# Patient Record
Sex: Female | Born: 1980 | Race: White | Hispanic: No | Marital: Single | Smoking: Former smoker
Health system: Southern US, Community
[De-identification: ages and names within clinical notes are randomized; demographics above are authoritative.]

## PROBLEM LIST (undated history)

## (undated) DIAGNOSIS — F1910 Other psychoactive substance abuse, uncomplicated: Secondary | ICD-10-CM

## (undated) DIAGNOSIS — U071 COVID-19: Secondary | ICD-10-CM

## (undated) DIAGNOSIS — F419 Anxiety disorder, unspecified: Secondary | ICD-10-CM

## (undated) DIAGNOSIS — M779 Enthesopathy, unspecified: Secondary | ICD-10-CM

## (undated) DIAGNOSIS — N390 Urinary tract infection, site not specified: Secondary | ICD-10-CM

## (undated) DIAGNOSIS — F329 Major depressive disorder, single episode, unspecified: Secondary | ICD-10-CM

## (undated) DIAGNOSIS — F319 Bipolar disorder, unspecified: Secondary | ICD-10-CM

## (undated) DIAGNOSIS — F32A Depression, unspecified: Secondary | ICD-10-CM

## (undated) DIAGNOSIS — E785 Hyperlipidemia, unspecified: Secondary | ICD-10-CM

## (undated) HISTORY — DX: Other psychoactive substance abuse, uncomplicated: F19.10

## (undated) HISTORY — DX: COVID-19: U07.1

## (undated) HISTORY — PX: TUBAL LIGATION: SHX77

## (undated) HISTORY — DX: Enthesopathy, unspecified: M77.9

## (undated) HISTORY — DX: Hyperlipidemia, unspecified: E78.5

## (undated) HISTORY — PX: LEEP: SHX91

## (undated) HISTORY — DX: Urinary tract infection, site not specified: N39.0

---
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
## 1999-04-16 ENCOUNTER — Inpatient Hospital Stay (HOSPITAL_COMMUNITY): Admission: AD | Admit: 1999-04-16 | Discharge: 1999-04-16 | Payer: Self-pay | Admitting: Obstetrics

## 1999-05-11 ENCOUNTER — Emergency Department (HOSPITAL_COMMUNITY): Admission: EM | Admit: 1999-05-11 | Discharge: 1999-05-11 | Payer: Self-pay | Admitting: Emergency Medicine

## 1999-05-11 ENCOUNTER — Encounter: Payer: Self-pay | Admitting: Emergency Medicine

## 1999-05-19 ENCOUNTER — Other Ambulatory Visit: Admission: RE | Admit: 1999-05-19 | Discharge: 1999-05-19 | Payer: Self-pay | Admitting: Gynecology

## 1999-07-19 ENCOUNTER — Other Ambulatory Visit: Admission: RE | Admit: 1999-07-19 | Discharge: 1999-07-19 | Payer: Self-pay | Admitting: Gynecology

## 1999-07-20 ENCOUNTER — Encounter (INDEPENDENT_AMBULATORY_CARE_PROVIDER_SITE_OTHER): Payer: Self-pay

## 1999-07-20 ENCOUNTER — Other Ambulatory Visit: Admission: RE | Admit: 1999-07-20 | Discharge: 1999-07-20 | Payer: Self-pay | Admitting: Gynecology

## 1999-10-20 ENCOUNTER — Emergency Department (HOSPITAL_COMMUNITY): Admission: EM | Admit: 1999-10-20 | Discharge: 1999-10-20 | Payer: Self-pay | Admitting: Emergency Medicine

## 1999-12-10 ENCOUNTER — Inpatient Hospital Stay (HOSPITAL_COMMUNITY): Admission: AD | Admit: 1999-12-10 | Discharge: 1999-12-10 | Payer: Self-pay | Admitting: *Deleted

## 1999-12-13 ENCOUNTER — Inpatient Hospital Stay (HOSPITAL_COMMUNITY): Admission: AD | Admit: 1999-12-13 | Discharge: 1999-12-15 | Payer: Self-pay | Admitting: Gynecology

## 2000-01-24 ENCOUNTER — Other Ambulatory Visit: Admission: RE | Admit: 2000-01-24 | Discharge: 2000-01-24 | Payer: Self-pay | Admitting: Gynecology

## 2000-01-30 ENCOUNTER — Encounter (INDEPENDENT_AMBULATORY_CARE_PROVIDER_SITE_OTHER): Payer: Self-pay | Admitting: Specialist

## 2000-01-30 ENCOUNTER — Other Ambulatory Visit: Admission: RE | Admit: 2000-01-30 | Discharge: 2000-01-30 | Payer: Self-pay | Admitting: Gynecology

## 2000-03-05 ENCOUNTER — Other Ambulatory Visit: Admission: RE | Admit: 2000-03-05 | Discharge: 2000-03-05 | Payer: Self-pay | Admitting: Gynecology

## 2000-03-05 ENCOUNTER — Encounter (INDEPENDENT_AMBULATORY_CARE_PROVIDER_SITE_OTHER): Payer: Self-pay | Admitting: Specialist

## 2001-08-27 ENCOUNTER — Other Ambulatory Visit: Admission: RE | Admit: 2001-08-27 | Discharge: 2001-08-27 | Payer: Self-pay | Admitting: Gynecology

## 2002-02-21 ENCOUNTER — Observation Stay (HOSPITAL_COMMUNITY): Admission: AD | Admit: 2002-02-21 | Discharge: 2002-02-22 | Payer: Self-pay | Admitting: *Deleted

## 2002-03-06 ENCOUNTER — Inpatient Hospital Stay (HOSPITAL_COMMUNITY): Admission: AD | Admit: 2002-03-06 | Discharge: 2002-03-08 | Payer: Self-pay | Admitting: *Deleted

## 2002-03-13 ENCOUNTER — Inpatient Hospital Stay (HOSPITAL_COMMUNITY): Admission: AD | Admit: 2002-03-13 | Discharge: 2002-03-13 | Payer: Self-pay | Admitting: Gynecology

## 2002-04-21 ENCOUNTER — Other Ambulatory Visit: Admission: RE | Admit: 2002-04-21 | Discharge: 2002-04-21 | Payer: Self-pay | Admitting: *Deleted

## 2002-10-26 ENCOUNTER — Emergency Department (HOSPITAL_COMMUNITY): Admission: EM | Admit: 2002-10-26 | Discharge: 2002-10-26 | Payer: Self-pay | Admitting: Emergency Medicine

## 2002-10-28 ENCOUNTER — Emergency Department (HOSPITAL_COMMUNITY): Admission: EM | Admit: 2002-10-28 | Discharge: 2002-10-28 | Payer: Self-pay | Admitting: Emergency Medicine

## 2003-01-13 ENCOUNTER — Other Ambulatory Visit: Admission: RE | Admit: 2003-01-13 | Discharge: 2003-01-13 | Payer: Self-pay | Admitting: Gynecology

## 2003-04-03 ENCOUNTER — Inpatient Hospital Stay (HOSPITAL_COMMUNITY): Admission: AD | Admit: 2003-04-03 | Discharge: 2003-04-04 | Payer: Self-pay | Admitting: Gynecology

## 2003-06-11 ENCOUNTER — Emergency Department (HOSPITAL_COMMUNITY): Admission: EM | Admit: 2003-06-11 | Discharge: 2003-06-12 | Payer: Self-pay | Admitting: Emergency Medicine

## 2003-06-29 ENCOUNTER — Inpatient Hospital Stay (HOSPITAL_COMMUNITY): Admission: AD | Admit: 2003-06-29 | Discharge: 2003-06-29 | Payer: Self-pay | Admitting: Gynecology

## 2003-08-18 ENCOUNTER — Inpatient Hospital Stay (HOSPITAL_COMMUNITY): Admission: AD | Admit: 2003-08-18 | Discharge: 2003-08-20 | Payer: Self-pay | Admitting: Gynecology

## 2003-09-14 ENCOUNTER — Emergency Department (HOSPITAL_COMMUNITY): Admission: EM | Admit: 2003-09-14 | Discharge: 2003-09-14 | Payer: Self-pay | Admitting: *Deleted

## 2003-11-05 ENCOUNTER — Emergency Department (HOSPITAL_COMMUNITY): Admission: EM | Admit: 2003-11-05 | Discharge: 2003-11-05 | Payer: Self-pay | Admitting: Emergency Medicine

## 2006-10-27 ENCOUNTER — Emergency Department (HOSPITAL_COMMUNITY): Admission: EM | Admit: 2006-10-27 | Discharge: 2006-10-27 | Payer: Self-pay | Admitting: Emergency Medicine

## 2007-03-30 ENCOUNTER — Inpatient Hospital Stay (HOSPITAL_COMMUNITY): Admission: AD | Admit: 2007-03-30 | Discharge: 2007-03-30 | Payer: Self-pay | Admitting: Obstetrics and Gynecology

## 2007-04-22 ENCOUNTER — Inpatient Hospital Stay (HOSPITAL_COMMUNITY): Admission: AD | Admit: 2007-04-22 | Discharge: 2007-04-22 | Payer: Self-pay | Admitting: Obstetrics and Gynecology

## 2007-05-20 ENCOUNTER — Encounter (INDEPENDENT_AMBULATORY_CARE_PROVIDER_SITE_OTHER): Payer: Self-pay | Admitting: Obstetrics and Gynecology

## 2007-05-20 ENCOUNTER — Inpatient Hospital Stay (HOSPITAL_COMMUNITY): Admission: RE | Admit: 2007-05-20 | Discharge: 2007-05-21 | Payer: Self-pay | Admitting: Obstetrics and Gynecology

## 2007-06-30 ENCOUNTER — Inpatient Hospital Stay (HOSPITAL_COMMUNITY): Admission: AD | Admit: 2007-06-30 | Discharge: 2007-06-30 | Payer: Self-pay | Admitting: Obstetrics and Gynecology

## 2008-02-01 ENCOUNTER — Emergency Department (HOSPITAL_COMMUNITY): Admission: EM | Admit: 2008-02-01 | Discharge: 2008-02-01 | Payer: Self-pay | Admitting: Emergency Medicine

## 2008-07-22 ENCOUNTER — Emergency Department (HOSPITAL_COMMUNITY): Admission: EM | Admit: 2008-07-22 | Discharge: 2008-07-22 | Payer: Self-pay | Admitting: Emergency Medicine

## 2008-08-25 ENCOUNTER — Emergency Department (HOSPITAL_COMMUNITY): Admission: EM | Admit: 2008-08-25 | Discharge: 2008-08-25 | Payer: Self-pay | Admitting: Emergency Medicine

## 2008-10-15 ENCOUNTER — Emergency Department (HOSPITAL_COMMUNITY): Admission: EM | Admit: 2008-10-15 | Discharge: 2008-10-15 | Payer: Self-pay | Admitting: Emergency Medicine

## 2008-10-19 ENCOUNTER — Emergency Department (HOSPITAL_COMMUNITY): Admission: EM | Admit: 2008-10-19 | Discharge: 2008-10-19 | Payer: Self-pay | Admitting: Emergency Medicine

## 2008-10-21 ENCOUNTER — Emergency Department (HOSPITAL_COMMUNITY): Admission: EM | Admit: 2008-10-21 | Discharge: 2008-10-21 | Payer: Self-pay | Admitting: Emergency Medicine

## 2008-12-19 ENCOUNTER — Emergency Department (HOSPITAL_COMMUNITY): Admission: EM | Admit: 2008-12-19 | Discharge: 2008-12-19 | Payer: Self-pay | Admitting: Emergency Medicine

## 2009-02-24 ENCOUNTER — Emergency Department (HOSPITAL_COMMUNITY): Admission: EM | Admit: 2009-02-24 | Discharge: 2009-02-24 | Payer: Self-pay | Admitting: Emergency Medicine

## 2009-03-18 ENCOUNTER — Emergency Department: Payer: Self-pay

## 2009-03-19 ENCOUNTER — Emergency Department: Payer: Self-pay | Admitting: Emergency Medicine

## 2010-08-27 ENCOUNTER — Encounter: Payer: Self-pay | Admitting: Family Medicine

## 2010-11-14 LAB — CBC
HCT: 37.9 % (ref 36.0–46.0)
Hemoglobin: 13.1 g/dL (ref 12.0–15.0)
MCHC: 34.5 g/dL (ref 30.0–36.0)
MCV: 89.1 fL (ref 78.0–100.0)
Platelets: 238 10*3/uL (ref 150–400)
RBC: 4.25 MIL/uL (ref 3.87–5.11)
RDW: 16.7 % — ABNORMAL HIGH (ref 11.5–15.5)
WBC: 9.1 10*3/uL (ref 4.0–10.5)

## 2010-11-14 LAB — URINALYSIS, ROUTINE W REFLEX MICROSCOPIC
Bilirubin Urine: NEGATIVE
Glucose, UA: NEGATIVE mg/dL
Hgb urine dipstick: NEGATIVE
Ketones, ur: NEGATIVE mg/dL
Nitrite: NEGATIVE
Protein, ur: NEGATIVE mg/dL
Specific Gravity, Urine: 1.005 (ref 1.005–1.030)
Urobilinogen, UA: 0.2 mg/dL (ref 0.0–1.0)
pH: 5 (ref 5.0–8.0)

## 2010-11-14 LAB — DIFFERENTIAL
Basophils Absolute: 0 10*3/uL (ref 0.0–0.1)
Basophils Relative: 0 % (ref 0–1)
Eosinophils Absolute: 0 10*3/uL (ref 0.0–0.7)
Eosinophils Relative: 0 % (ref 0–5)
Lymphocytes Relative: 19 % (ref 12–46)
Lymphs Abs: 1.7 10*3/uL (ref 0.7–4.0)
Monocytes Absolute: 0.2 10*3/uL (ref 0.1–1.0)
Monocytes Relative: 3 % (ref 3–12)
Neutro Abs: 7.1 10*3/uL (ref 1.7–7.7)
Neutrophils Relative %: 78 % — ABNORMAL HIGH (ref 43–77)

## 2010-11-14 LAB — BASIC METABOLIC PANEL
BUN: 3 mg/dL — ABNORMAL LOW (ref 6–23)
CO2: 27 mEq/L (ref 19–32)
Calcium: 8.9 mg/dL (ref 8.4–10.5)
Chloride: 112 mEq/L (ref 96–112)
Creatinine, Ser: 0.71 mg/dL (ref 0.4–1.2)
GFR calc Af Amer: >60 mL/min (ref >60)
GFR calc non Af Amer: >60 mL/min (ref >60)
Glucose, Bld: 104 mg/dL — ABNORMAL HIGH (ref 70–99)
Potassium: 4 mEq/L (ref 3.5–5.1)
Sodium: 141 mEq/L (ref 135–145)

## 2010-11-14 LAB — PREGNANCY, URINE: Preg Test, Ur: NEGATIVE

## 2010-12-19 NOTE — Discharge Summary (Signed)
NAMELYRIC, ROSSANO             ACCOUNT NO.:  [id number]   MEDICAL RECORD NO.:  [id number]          PATIENT TYPE:  INP   LOCATION:  9142                          FACILITY:  [facility]   PHYSICIAN:  Malachi Pro. Ambrose Mantle, M.D. DATE OF BIRTH:  [birth year]   DATE OF ADMISSION:  05/20/2007  DATE OF DISCHARGE:  05/21/2007                               DISCHARGE SUMMARY   The patient's history and physical, prenatal course, and labor course  are outlined in her admission history and physical.  The patient was  admitted in late first stage of labor, was given ampicillin for positive  group B strep, then delivered, spontaneously, of a 7 pound 13 ounce  female infant with Apgars of 6 at one and 9 at five minutes.   Postpartum the patient strongly requested tubal ligation.  She had  signed her tubal sterilization forms.  She underwent tubal sterilization  on the afternoon of the day of delivery, did well postpartum, and on the  first postop day was ready for discharge.  Hemoglobin on admission 11.1,  hematocrit 32.5, white count 11,900, platelet count 228,000.  Followup  hemoglobin 8.9, RPR was nonreactive.   FINAL DIAGNOSIS:  1. Intrauterine pregnancy at 40+ weeks, delivered, vertex.  2. Voluntary sterilization.   OPERATION:  1. Spontaneous delivery of vertex.  2. Bilateral tubal ligation.   FINAL CONDITION:  Improved.   INSTRUCTIONS INCLUDE:  Our regular discharge instruction booklet.  She  is to continue taking her Zoloft 50 mg by mouth every day, Percocet  5/325 thirty tablets one or two q.4-6 h. as needed for pain.  The  patient is advised to return to the office in 10-14 days for followup  examination.  Because of the baby's Apgar of 6 at one minute, a pH was  done; it was 7.38.      Malachi Pro. Ambrose Mantle, M.D.  Electronically Signed     TFH/MEDQ  D:  05/21/2007  T:  05/21/2007  Job:  [id number]

## 2010-12-19 NOTE — Consult Note (Signed)
NAME:  [name], [name]             ACCOUNT NO.:  [id number]   MEDICAL RECORD NO.:  [id number]          PATIENT TYPE:  EMS   LOCATION:  ED                            FACILITY:  [facility]   PHYSICIAN:  J. Darreld Mclean, M.D. DATE OF BIRTH:  01-May-1981   DATE OF CONSULTATION:  DATE OF DISCHARGE:  02/24/2009                                 CONSULTATION   The patient was seen in the ER as per the request of ER physician.  The  patient is a 30 year old female.   Yesterday, she had a pool.  She was going to try to jump into it where  there was a ledge near the roof of her house.  She stepped on just off  of the roof slightly to this ledge and then jumped into the pool.  She  missed the pool and hit her foot on the right.  She had marked pain  tenderness of the right heel.  She went nice again to the house,  elevated her foot, stayed off of it.  The right foot pain has continued.  She came in to the ER today.  X-rays plus CT show fracture of the left  calcaneus with some comminution, but essentially nondisplaced.  Extension to the subtalar joint.  Neurologically intact.  No other  injuries.  No head injury.  No back injury.  No knee injury.  Neurovascular is intact to the foot.  There is some swelling.   IMPRESSION:  Comminuted fracture of right calcaneus, essentially  nondisplaced.   PLAN:  I will put her on a posterior splint, crutches, elevation, ice,  no weightbearing, stay off of it.  Prescription of Tylox was given for  pain.  I will see her in the office on Monday, return if any problem.           ______________________________  Shela Commons. Darreld Mclean, M.D.     JWK/MEDQ  D:  02/24/2009  T:  02/25/2009  Job:  [id number]

## 2010-12-19 NOTE — H&P (Signed)
[name], [name]             ACCOUNT NO.:  [id number]   MEDICAL RECORD NO.:  [id number]          PATIENT TYPE:  INP   LOCATION:  9142                          FACILITY:  [facility]   PHYSICIAN:  Malachi Pro. Ambrose Mantle, M.D. DATE OF BIRTH:  04-Feb-1981   DATE OF ADMISSION:  05/20/2007  DATE OF DISCHARGE:                              HISTORY & PHYSICAL   PREOPERATIVE DIAGNOSIS:  Voluntary sterilization.   POSTOPERATIVE DIAGNOSIS:  Voluntary sterilization.   OPERATION:  Tubal ligation.   OPERATOR:  Malachi Pro. Ambrose Mantle, M.D.   ANESTHESIA:  General anesthesia.   The patient was brought to the operating room and given general  anesthesia and intubated.  The abdomen was prepped with Betadine  solution and draped as a sterile field the inferior portion of the  umbilicus was elevated with Allis clamps.  An incision was made between  the clamps.  The incision was then carried down to the fascia.  The  fascia was elevated and incised.  The peritoneum was entered with blunt  dissection.  I had to use a pack to prepare the operative field so I  could see the tubes, but saw the right tube, traced it to its fimbriated  end, and visualized the superior portion of the right ovary.  A window  was made in the right mesosalpinx.  Two ties were placed distally and  proximally on the tube.  The segment of tube intervening was removed.  There was no bleeding.  The sutures were cut.  The same procedure was  repeated on the left side.  A little bit short segment of tube was  removed, but an adequate section was removed.  There was no bleeding.  The pack was removed.  The abdominal wall was closed with interrupted  figure-of-eight sutures of 0 Vicryl in the fascia, interrupted sutures  of 3-0 Vicryl in the subcutaneous tissue, and 3-0 plain catgut on the  skin.  The patient seemed to tolerate the procedure well.  Blood loss  was less than 5 mL.  Sponge and needle counts were correct.  The patient  was returned to  recovery in satisfactory condition.  The superior  portion of the left ovary was also visualized and was normal, and the  uterus appeared normal to my limited inspection.      Malachi Pro. Ambrose Mantle, M.D.  Electronically Signed     TFH/MEDQ  D:  05/20/2007  T:  05/21/2007  Job:  [id number]

## 2010-12-19 NOTE — H&P (Signed)
[name], [name]             ACCOUNT NO.:  [id number]   MEDICAL RECORD NO.:  [id number]          PATIENT TYPE:  INP   LOCATION:  9142                          [facility]ILITY[facility]  [facility]   PHYSICIAN:  Malachi Pro. Ambrose Mantle, M.D. DATE OF BIRTH:  July 16, 1981   DATE OF ADMISSION:  05/20/2007  DATE OF DISCHARGE:                              HISTORY & PHYSICAL   This is a 30 year old white single female, para 3-0-0-3, gravida 5 whose  EDC is May 18, 2007, admitted at 9 cm dilatation, in active labor,  with a positive Group B Streptococcus.  Blood group and type B positive,  negative antibody, nonreactive serology, rubella immune, hepatitis B  surface antigen negative, HIV negative, GC and chlamydia negative.  First trimester screen negative, AFP negative.  One-hour Glucola 48.  Group B Streptococcus positive. Vaginal ultrasound on October 28, 2006:  Crown-rump length 4.24 cm, 11 weeks 1 day, [facility] [place] May 18, 2007.  The  patient smoked and was advised to quit.  Ultrasound, Dec 25, 2006:  Average gestational age [redacted] weeks 4 days, [facility] May 22, 2007.  On  April 16, 2007, size was less than dates.  Nonstress test and  ultrasound showed reactive nonstress test and normal AFI, with good  growth.  The patient began contracting at 3 a.m. on the day of  admission.  She came to the hospital and was checked at 5:50 a.m. and  was found to be 9 cm dilated.  IV was begun, and ampicillin was started  for the positive Group B Streptococcus.   PAST MEDICAL HISTORY:   ALLERGIES:  1. MORPHINE SULFATE, itching and rash.  2. LATEX allergy.   ILLNESSES:  1. Anxiety and depression.  2. Migraines.   OPERATIONS:  None.   ALCOHOL TOBACCO, AND DRUGS:  Tobacco use.   FAMILY HISTORY:  Father with high blood pressure, thyroid problems,  renal failure and bipolar illness.  Mother with high blood pressure.   OBSTETRICAL HISTORY:  Three vaginal deliveries in 2001, 2003, and 2005,  all female, all delivered vaginally.  There was in 2007 an early abortion.   PHYSICAL EXAMINATION:  VITAL SIGNS:  Normal.  HEART/LUNGS:  Normal.  ABDOMEN:  Soft.  Fundal height was 37 cm.  Fetal heart tones were  normal.  PELVIC:  Cervix was 9 cm.  Intact bag of waters.   We waited for the ampicillin to take effect.  However, the patient  became more and more uncomfortable, so an amniotomy was done.  She  progressed to deliver spontaneously a 7 pound, 13 ounce female infant  over an intact  perineum.  Placenta was intact.  The patient requested tubal ligation.  I have counseled her about the pros and cons of sterilization.  She  persisted in her desire to proceed with sterilization.  She had artery  signed her Medicaid sterilization forms well in advance of the 30 days  required.      Malachi Pro. Ambrose Mantle, M.D.  Electronically Signed     TFH/MEDQ  D:  05/20/2007  T:  05/21/2007  Job:  [id number]

## 2010-12-22 NOTE — H&P (Signed)
NAME:  [name], TSCHIRHART                       ACCOUNT NO.:  [id number]   MEDICAL RECORD NO.:  [id number]                   PATIENT TYPE:  MAT   LOCATION:  MATC                                 FACILITY:  [facility]   PHYSICIAN:  Ivor Costa. Farrel Gobble, M.D.              DATE OF BIRTH:  09/01/1980   DATE OF ADMISSION:  DATE OF DISCHARGE:                                HISTORY & PHYSICAL   CHIEF COMPLAINT:  Labor.   HISTORY OF PRESENT ILLNESS:  The patient is a 30 year old Gravida III, Para  2 with an estimated date of confinement of August 24, 2003 by six week  ultrasound and an estimated gestational age of 81 and 2/7 weeks who came  into ithe office today complaining of generalized abdominal discomfort.  She  noted some contractions, but they were not timed.  No fever, chills, nausea,  vomiting or diarrhea.  She reports good fetal movement.  No loss of fluid  and no vaginal bleeding.  She was noted on exam to be 4 to 5 cm dilated, 70%  effaced and is being sent to the hospital for labor.  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   LABORATORY DATA:  Prenatal laboratories:  B positive. Antibody negative.  RPR non-reactive.  Rubella immune. Hepatitis B surface antigen non-reactive.  HIV non-reactive.   Please refer to the [facility] physical and examination sheets.   PHYSICAL EXAMINATION:  GENERAL:  This is an uncomfortable Gravida in mild  distress.  VITAL SIGNS:  Blood pressure is 126/74, weight is 173 which is 21 pound  weight gain.  HEART:  Regular rate.  LUNGS:  Clear to auscultation.  ABDOMEN:  Gravid, non-tender, moderate contractions are palpable, fetal  heart tones are auscultated.  VAGINAL EXAM:  She is 4 to 5 cm dilated, 70% and -2 station vertex.   IMPRESSION AND PLAN:  The patient will be admitted to labor and delivery for  artificial rupture of membranes and expected delivery.                                         Ivor Costa. Farrel Gobble, M.D.    THL/MEDQ  D:  08/18/2003  T:  08/18/2003  Job:  [id number]

## 2010-12-22 NOTE — Discharge Summary (Signed)
[facility]  Patient:    [name], [name]                       MRN: [id number] Adm. Date:  [id number] Disc. Date: [id number] Attending:  Tonye Royalty Dictator:   Antony Contras, [facility]                           Discharge Summary  DISCHARGE DIAGNOSES:          1. Intrauterine pregnancy at 41 weeks.                               2. Induction of labor.                               3. Delivery of viable infant.  PROCEDURES:                   Normal spontaneous vaginal delivery over a first degree midline episiotomy with a left labial laceration.  HISTORY OF PRESENT ILLNESS:   Patient is a 30 year old primigravida with an uncertain LMP, EDD was Dec 05, 1999 by ultrasound.  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  PRENATAL LABORATORY:          Blood type is B positive, antibody screen negative;  HBsAg, HIV, RPR nonreactive; MSAFP within normal limits; beta strep was negative.  HOSPITAL COURSE AND TREATMENT:                    Patient was admitted for induction of labor due to postdates on Dec 13, 1999.  Cervix on admission was 1 cm, 90% effaced, -2 station.  Induction was begun with Pitocin.  Artificial rupture of membranes revealed clear fluid.   IUPC and fetal scalp electrode were placed, epidural administered.  She progressed to complete dilatation and delivered an Apgar 9 and 38 female infant over a first degree midline episiotomy with a left labial laceration.  Her postpartum course was within normal limits.  POSTPARTUM LABORATORY:        CBC revealed hemoglobin 11.6, hematocrit 34.6, wbcs 14.9, platelets 198.  DISPOSITION:                  She was discharged on postpartum day #1, doing well.  FOLLOW-UP:                    [facility] in six weeks.  MEDICATIONS:                  Continue with  prenatal vitamins and iron, Motrin and Tylenol for pain. DD:  01/02/00 TD:  01/02/00 Job: [id number]

## 2010-12-22 NOTE — Discharge Summary (Signed)
NAME:  [name], [name]                       ACCOUNT NO.:  [id number]   MEDICAL RECORD NO.:  [id number]                   PATIENT TYPE:  INP   LOCATION:  9123                                 FACILITY:  [facility]   PHYSICIAN:  Timothy P. Fontaine, M.D.           DATE OF BIRTH:  1981/04/24   DATE OF ADMISSION:  08/18/2003  DATE OF DISCHARGE:  08/20/2003                                 DISCHARGE SUMMARY   DISCHARGE DIAGNOSES:  1. Intrauterine pregnancy at 39+ weeks, delivered.  2. Status post spontaneous vaginal delivery.  3. Positive group B Streptococcus.   HISTORY:  This is a 22-years-of-age female gravida 3 para 2 with an EDC of  August 24, 2003.  Prenatal course had been complicated by the patient  having a history of __________.  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  She was seen in the office on date of admission for  generalized abdominal discomfort, had noted some contractions but not timed.  She was noted on exam, however, to be 4-5 cm dilated, 70% effaced, and was  sent to hospital for labor.  She also had a history of positive group B  strep.   HOSPITAL COURSE:  On August 18, 2003 the patient was admitted at 39 weeks  in labor, begun on IV Penicillin G for group B strep prophylaxis, and  subsequently underwent artificial rupture of membranes and underwent  spontaneous vaginal delivery on August 18, 2003 of a female, Apgars of 9 and  10, weight of 8 pounds 7 ounces.  There were no complications.  There was no  episiotomy or laceration.  Postpartum the patient remained afebrile,  voiding, in stable condition.  She was discharged to home on August 20, 2003 and given [facility] postpartum instructions and postpartum  booklet.   ACCESSORY CLINICAL FINDINGS/LABORATORY DATA:  The patient is B positive,  rubella immune.  On August 19, 2003 hemoglobin was 10.5.   DISPOSITION:  The patient is discharged to home, follow up in 6 weeks, if  had  any problem prior to that time to be seen in the office.     Susa Loffler, P.A.                    Timothy P. Audie Box, M.D.    Ardath Sax  D:  09/27/2003  T:  09/27/2003  Job:  [id number]

## 2010-12-22 NOTE — Discharge Summary (Signed)
   NAME:  [name], [name]                       ACCOUNT NO.:  [id number]   MEDICAL RECORD NO.:  [id number]                   PATIENT TYPE:  MAT   LOCATION:  MATC                                 [facility]ILITY[facility]   PHYSICIAN:  Devin M. Ciliberti, M.D.            DATE OF BIRTH:  01-28-1981   DATE OF ADMISSION:  03/07/2002  DATE OF DISCHARGE:  03/08/2002                                 DISCHARGE SUMMARY   DISCHARGE DIAGNOSES:  Intrauterine pregnancy 39 weeks delivered, status post  spontaneous vaginal delivery on March 07, 2002.   HISTORY:  This is a 30 year old female gravida 2, para 1 with an EDC of  March 11, 2002.  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   HOSPITAL COURSE:  On March 07, 2002 patient presented at 39 weeks __________  labor, 3 cm.  Rupture of membranes was performed and patient __________  Pitocin.  On March 07, 2002 at 1057 patient underwent a spontaneous vaginal  delivery with no laceration or episiotomy of a female Apgars of 9/9, weight 8  pounds 6 ounces.  Postpartum patient remained afebrile, voiding, in stable  condition.  She wanted to be discharged on postpartum day number one on  August 3.  Therefore, discharged home in stable condition.  Given [facility]  Gynecology postpartum instructions.   LABORATORIES:  The patient is B+.  Rubella immune.  On March 08, 2002  hemoglobin 10.8, hematocrit 31.3.   DISPOSITION:  The patient is discharged home.  Return in six weeks.  Any  problems prior to that time may be seen in the office.     Susa Loffler, P.A.                    Devin M. Ciliberti, M.D.    TSG/MEDQ  D:  04/03/2002  T:  04/03/2002  Job:  [id number]

## 2011-05-11 LAB — URINALYSIS, ROUTINE W REFLEX MICROSCOPIC
Bilirubin Urine: NEGATIVE
Glucose, UA: NEGATIVE mg/dL
Ketones, ur: NEGATIVE mg/dL
Leukocytes, UA: NEGATIVE
Nitrite: NEGATIVE
Protein, ur: NEGATIVE mg/dL
Specific Gravity, Urine: <1.005 — ABNORMAL LOW (ref 1.005–1.030)
Urobilinogen, UA: 0.2 mg/dL (ref 0.0–1.0)
pH: 6 (ref 5.0–8.0)

## 2011-05-11 LAB — PREGNANCY, URINE: Preg Test, Ur: NEGATIVE

## 2011-05-11 LAB — URINE MICROSCOPIC-ADD ON

## 2011-05-11 LAB — GC/CHLAMYDIA PROBE AMP, GENITAL
Chlamydia, DNA Probe: NEGATIVE
GC Probe Amp, Genital: NEGATIVE

## 2011-05-11 LAB — URINE CULTURE: Colony Count: 3000

## 2011-05-11 LAB — WET PREP, GENITAL
Clue Cells Wet Prep HPF POC: NONE SEEN
Trich, Wet Prep: NONE SEEN
Yeast Wet Prep HPF POC: NONE SEEN

## 2011-05-15 LAB — CBC
HCT: 36.2
Hemoglobin: 12.2
MCHC: 33.7
MCV: 85
Platelets: 261
RBC: 4.26
RDW: 15
WBC: 14.3 — ABNORMAL HIGH

## 2011-05-15 LAB — URINALYSIS, ROUTINE W REFLEX MICROSCOPIC
Bilirubin Urine: NEGATIVE
Glucose, UA: NEGATIVE
Hgb urine dipstick: NEGATIVE
Ketones, ur: NEGATIVE
Nitrite: NEGATIVE
Protein, ur: NEGATIVE
Specific Gravity, Urine: <1.005 — ABNORMAL LOW
Urobilinogen, UA: 0.2
pH: 7

## 2011-05-15 LAB — WET PREP, GENITAL
Clue Cells Wet Prep HPF POC: NONE SEEN
Trich, Wet Prep: NONE SEEN
Yeast Wet Prep HPF POC: NONE SEEN

## 2011-05-15 LAB — GC/CHLAMYDIA PROBE AMP, GENITAL
Chlamydia, DNA Probe: NEGATIVE
GC Probe Amp, Genital: NEGATIVE

## 2011-05-15 LAB — PREGNANCY, URINE: Preg Test, Ur: NEGATIVE

## 2011-05-16 LAB — CBC
HCT: 26.1 — ABNORMAL LOW
Hemoglobin: 8.9 — ABNORMAL LOW
MCHC: 34
MCV: 88
Platelets: 185
RBC: 2.96 — ABNORMAL LOW
RDW: 14.1 — ABNORMAL HIGH
WBC: 11.7 — ABNORMAL HIGH

## 2011-05-17 LAB — CBC
HCT: 32.5 — ABNORMAL LOW
Hemoglobin: 11.1 — ABNORMAL LOW
MCHC: 34.1
MCV: 87.4
Platelets: 228
RBC: 3.71 — ABNORMAL LOW
RDW: 13.9
WBC: 11.9 — ABNORMAL HIGH

## 2011-05-17 LAB — RPR: RPR Ser Ql: NONREACTIVE

## 2011-05-18 LAB — FETAL FIBRONECTIN: Fetal Fibronectin: NEGATIVE

## 2012-05-05 ENCOUNTER — Encounter (HOSPITAL_COMMUNITY): Payer: Self-pay | Admitting: Family Medicine

## 2012-05-05 ENCOUNTER — Emergency Department (HOSPITAL_COMMUNITY)
Admission: EM | Admit: 2012-05-05 | Discharge: 2012-05-05 | Disposition: A | Discharge status: Home / Self Care | Payer: Self-pay | Attending: Emergency Medicine | Admitting: Emergency Medicine

## 2012-05-05 ENCOUNTER — Emergency Department (HOSPITAL_COMMUNITY): Payer: Self-pay

## 2012-05-05 DIAGNOSIS — T148XXA Other injury of unspecified body region, initial encounter: Secondary | ICD-10-CM

## 2012-05-05 DIAGNOSIS — IMO0002 Reserved for concepts with insufficient information to code with codable children: Secondary | ICD-10-CM | POA: Insufficient documentation

## 2012-05-05 DIAGNOSIS — S5010XA Contusion of unspecified forearm, initial encounter: Secondary | ICD-10-CM | POA: Insufficient documentation

## 2012-05-05 DIAGNOSIS — Y9383 Activity, rough housing and horseplay: Secondary | ICD-10-CM | POA: Insufficient documentation

## 2012-05-05 MED ORDER — MORPHINE SULFATE 2 MG/ML IJ SOLN
2.0000 mg | Freq: Once | INTRAMUSCULAR | Status: AC
  Administered 2012-05-05: 2 mg via INTRAMUSCULAR
  Filled 2012-05-05: qty 1

## 2012-05-05 MED ORDER — OXYCODONE-ACETAMINOPHEN 5-325 MG PO TABS: 1.0000 | ORAL_TABLET | Freq: Four times a day (QID) | ORAL | Status: DC | PRN

## 2012-05-05 NOTE — ED Notes (Signed)
Pt sts that Saturday she was wrestling in the yard with her son and he fell on her right arm. sts she hears a pop. Pt complaining of right arm pain and burning and sts when she moves her fingers it feel like muscles are pulling in her arm. bruise noted to right forearm.

## 2012-05-05 NOTE — ED Provider Notes (Addendum)
History   This chart was scribed for Amanda Guppy, MD by [name]. This patient was seen in room TR07C/TR07C and the patient's care was started at 9:47AM.   CSN: [id number]  Arrival date & time 05/05/12  2130   First MD Initiated Contact with Patient 05/05/12 [phone]      Chief Complaint  Patient presents with  . Arm Injury    (Consider location/radiation/quality/duration/timing/severity/associated sxs/prior treatment) Patient is a 31 y.o. female presenting with arm injury. The history is provided by the patient. No language interpreter was used.  Arm Injury    [name] is a 31 y.o. female who presents to the Emergency Department complaining of constant, sharp, burning, gradually worsening, non-radiating left forearm pain after her son fell on her arm when wrestling last night.  Pt reports hearing a pop.  Pt has used OCM with no improvements.    History reviewed. No pertinent past medical history.  History reviewed. No pertinent past surgical history.  History reviewed. No pertinent family history.  History  Substance Use Topics  . Smoking status: Never Smoker   . Smokeless tobacco: Not on file  . Alcohol Use: No    No OB history provided.  Review of Systems  Allergies  Review of patient's allergies indicates no known allergies.  Home Medications  No current outpatient prescriptions on file.  BP 111/66  Pulse 96  Temp 98.2 F (36.8 C) (Oral)  Resp 16  SpO2 98%  Physical Exam  Nursing note and vitals reviewed. Constitutional: She is oriented to person, place, and time. She appears well-developed.  Musculoskeletal:       Ecchymosis over right forearm.  Right elbow has normal ROM.  Right hand normal.    Neurological: She is alert and oriented to person, place, and time.  Psychiatric: She has a normal mood and affect.    ED Course  Procedures (including critical care time) DIAGNOSTIC STUDIES: Oxygen Saturation is 98% on room air, normal by my  interpretation.    COORDINATION OF CARE: 9:52AM- Ordered morphine and right forearm XR. 10:38AM- Informed pt of negative XR.  Discussed discharge with pain meds.  Labs Reviewed - No data to display No results found.  Dg Forearm Right  05/05/2012  *RADIOLOGY REPORT*  Clinical Data: Trauma.  Eccymosis  RIGHT FOREARM - 2 VIEW  Comparison: None.  Findings: There is no evidence of fracture or dislocation.  There is no evidence of arthropathy or other focal bone abnormality. Soft tissues are unremarkable.  IMPRESSION:  Negative exam   Original Report Authenticated By: Rosealee Albee, M.D.     No diagnosis found.    MDM  Forearm contusion I personally performed the services described in this documentation, which was scribed in my presence. The recorded information has been reviewed and considered.         Amanda Guppy, MD 05/05/12 1042  Amanda Guppy, MD 05/05/12 1044

## 2012-05-05 NOTE — ED Notes (Signed)
Left fore arm pain after wrestling with children yesterday and one fell on her arm.

## 2013-01-09 ENCOUNTER — Emergency Department: Payer: Self-pay | Admitting: Emergency Medicine

## 2013-05-26 ENCOUNTER — Emergency Department (HOSPITAL_COMMUNITY): Payer: Medicaid Other

## 2013-05-26 ENCOUNTER — Emergency Department (HOSPITAL_COMMUNITY)
Admission: EM | Admit: 2013-05-26 | Discharge: 2013-05-26 | Disposition: A | Discharge status: Home / Self Care | Payer: Medicaid Other | Attending: Emergency Medicine | Admitting: Emergency Medicine

## 2013-05-26 ENCOUNTER — Encounter (HOSPITAL_COMMUNITY): Payer: Self-pay | Admitting: Emergency Medicine

## 2013-05-26 DIAGNOSIS — F411 Generalized anxiety disorder: Secondary | ICD-10-CM | POA: Insufficient documentation

## 2013-05-26 DIAGNOSIS — Z79899 Other long term (current) drug therapy: Secondary | ICD-10-CM | POA: Insufficient documentation

## 2013-05-26 DIAGNOSIS — Y93H2 Activity, gardening and landscaping: Secondary | ICD-10-CM | POA: Insufficient documentation

## 2013-05-26 DIAGNOSIS — Y92009 Unspecified place in unspecified non-institutional (private) residence as the place of occurrence of the external cause: Secondary | ICD-10-CM | POA: Insufficient documentation

## 2013-05-26 DIAGNOSIS — M25579 Pain in unspecified ankle and joints of unspecified foot: Secondary | ICD-10-CM | POA: Insufficient documentation

## 2013-05-26 DIAGNOSIS — R296 Repeated falls: Secondary | ICD-10-CM | POA: Insufficient documentation

## 2013-05-26 DIAGNOSIS — F319 Bipolar disorder, unspecified: Secondary | ICD-10-CM | POA: Insufficient documentation

## 2013-05-26 DIAGNOSIS — G8929 Other chronic pain: Secondary | ICD-10-CM

## 2013-05-26 HISTORY — DX: Anxiety disorder, unspecified: F41.9

## 2013-05-26 HISTORY — DX: Bipolar disorder, unspecified: F31.9

## 2013-05-26 MED ORDER — HYDROCODONE-ACETAMINOPHEN 5-325 MG PO TABS: 1.0000 | ORAL_TABLET | Freq: Four times a day (QID) | ORAL | Status: DC | PRN

## 2013-05-26 MED ORDER — HYDROCODONE-ACETAMINOPHEN 5-325 MG PO TABS
1.0000 | ORAL_TABLET | Freq: Once | ORAL | Status: AC
  Administered 2013-05-26: 1 via ORAL
  Filled 2013-05-26: qty 1

## 2013-05-26 NOTE — ED Provider Notes (Signed)
CSN: [id number]     Arrival date & time 05/26/13  1904 History   First MD Initiated Contact with Patient 05/26/13 2105     Chief Complaint  Patient presents with  . 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  States she has an extra down in her foot, ankle, and she is trying to save money to see a podiatrist to have this faxed.  Reports, that she has frequent episodes, where her, ankle.  We'll just give out.  She was wearing an ASO today, when she was pulling weeds in her yard stepped backwards and her ankle gave out causing pain  Patient is a 32 y.o. female presenting with ankle pain. The history is provided by the patient.  Ankle Pain Location:  Ankle Injury: yes   Mechanism of injury: fall   Fall:    Fall occurred:  Standing   Entrapped after fall: no   Ankle location:  R ankle Pain details:    Quality:  Aching   Radiates to:  Does not radiate   Severity:  Moderate   Onset quality:  Unable to specify   Duration: 4 years.   Timing:  Constant Chronicity:  Chronic Prior injury to area:  Yes Relieved by:  None tried Associated symptoms: no fever     Past Medical History  Diagnosis Date  . Anxiety   . Bipolar 1 disorder    Past Surgical History  Procedure Laterality Date  . Tubal ligation     No family history on file. History  Substance Use Topics  . Smoking status: Never Smoker   . Smokeless tobacco: Not on file  . Alcohol Use: No   OB History   Grav Para Term Preterm Abortions TAB SAB Ect Mult Living                 Review of Systems  Constitutional: Negative for fever.  Musculoskeletal: Positive for arthralgias. Negative for joint swelling.  Skin: Negative for color change, rash and wound.  All other systems reviewed and are negative.    Allergies  Review of patient's allergies indicates no known allergies.  Home  Medications   Current Outpatient Rx  Name  Route  Sig  Dispense  Refill  . acetaminophen (TYLENOL) 500 MG tablet   Oral   Take 1,000 mg by mouth every 6 (six) hours as needed for pain.         Marland Kitchen gabapentin (NEURONTIN) 800 MG tablet   Oral   Take 800 mg by mouth 3 (three) times daily.         Marland Kitchen ibuprofen (ADVIL,MOTRIN) 200 MG tablet   Oral   Take 800 mg by mouth every 6 (six) hours as needed for pain.         Marland Kitchen venlafaxine XR (EFFEXOR-XR) 150 MG 24 hr capsule   Oral   Take 150 mg by mouth daily.          Marland Kitchen HYDROcodone-acetaminophen (NORCO/VICODIN) 5-325 MG per tablet   Oral   Take 1 tablet by mouth every 6 (six) hours as needed for pain.   12 tablet   0    BP 144/96  Pulse 99  Temp(Src) 98.4 F (36.9 C) (Oral)  Resp 18  Wt 198 lb (89.812 kg)  SpO2 94%  LMP 05/19/2013 Physical Exam  Nursing note and vitals reviewed. Constitutional: She appears well-developed and well-nourished. No distress.  HENT:  Head: Normocephalic.  Eyes: Pupils are equal, round, and reactive to light.  Neck: Normal range of motion.  Cardiovascular: Normal rate.   Musculoskeletal: Normal range of motion. She exhibits tenderness. She exhibits no edema.  Is no swelling, erythema.  Break in the skin of the right ankle/foot, although she is exquisitely tender over the lateral malleolus  Neurological: She is alert.  Skin: Skin is warm. No erythema.    ED Course  Procedures (including critical care time) Labs Review Labs Reviewed - No data to display Imaging Review Dg Ankle Complete Right  05/26/2013   CLINICAL DATA:  Ankle pain  EXAM: RIGHT ANKLE - COMPLETE 3+ VIEW  COMPARISON:  05/10/2013  FINDINGS: Normal alignment no fracture. Ankle joint is normal and there is no effusion  IMPRESSION: Negative.   Electronically Signed   By: Marlan Palau M.D.   On: 05/26/2013 20:11    EKG Interpretation   None       MDM   1. Chronic ankle pain, right    Patient's x-ray is negative for any  fracture, dislocation, or swelling to have this patient.  Gain more stability.  I placed her in a Cam Walker to allow her to save enough money so she can see the podiatrist as scheduled   Arman Filter, NP 05/26/13 2149

## 2013-05-26 NOTE — ED Notes (Signed)
Pt. stepped backwards and injured " turn in over" her right ankle this afternoon with pain and slight swelling.

## 2013-05-26 NOTE — ED Notes (Signed)
Pt here for c/o r ankle pain from tripping over self. Slight swelling noted, pt c/o paint to touch.

## 2013-05-26 NOTE — Progress Notes (Signed)
Orthopedic Tech Progress Note Patient Details:  [name] [birth year] [id number]  Ortho Devices Type of Ortho Device: CAM walker Ortho Device/Splint Location: RLE Ortho Device/Splint Interventions: Ordered;Application   Jennye Moccasin 05/26/2013, 9:31 PM

## 2013-05-28 NOTE — ED Provider Notes (Signed)
Medical screening examination/treatment/procedure(s) were performed by non-physician practitioner and as supervising physician I was immediately available for consultation/collaboration.  EKG Interpretation   None        Lititia Sen, MD 05/28/13 1418 

## 2013-07-10 ENCOUNTER — Emergency Department (HOSPITAL_COMMUNITY): Payer: Medicaid Other

## 2013-07-10 ENCOUNTER — Encounter (HOSPITAL_COMMUNITY): Payer: Self-pay | Admitting: Emergency Medicine

## 2013-07-10 ENCOUNTER — Emergency Department (HOSPITAL_COMMUNITY)
Admission: EM | Admit: 2013-07-10 | Discharge: 2013-07-10 | Disposition: A | Discharge status: Home / Self Care | Payer: Medicaid Other | Attending: Emergency Medicine | Admitting: Emergency Medicine

## 2013-07-10 DIAGNOSIS — Z3202 Encounter for pregnancy test, result negative: Secondary | ICD-10-CM | POA: Insufficient documentation

## 2013-07-10 DIAGNOSIS — K625 Hemorrhage of anus and rectum: Secondary | ICD-10-CM | POA: Insufficient documentation

## 2013-07-10 DIAGNOSIS — N925 Other specified irregular menstruation: Secondary | ICD-10-CM | POA: Insufficient documentation

## 2013-07-10 DIAGNOSIS — R000 Tachycardia, unspecified: Secondary | ICD-10-CM | POA: Insufficient documentation

## 2013-07-10 DIAGNOSIS — M549 Dorsalgia, unspecified: Secondary | ICD-10-CM | POA: Insufficient documentation

## 2013-07-10 DIAGNOSIS — J3489 Other specified disorders of nose and nasal sinuses: Secondary | ICD-10-CM | POA: Insufficient documentation

## 2013-07-10 DIAGNOSIS — R319 Hematuria, unspecified: Secondary | ICD-10-CM | POA: Insufficient documentation

## 2013-07-10 DIAGNOSIS — Z79899 Other long term (current) drug therapy: Secondary | ICD-10-CM | POA: Insufficient documentation

## 2013-07-10 DIAGNOSIS — F411 Generalized anxiety disorder: Secondary | ICD-10-CM | POA: Insufficient documentation

## 2013-07-10 DIAGNOSIS — N939 Abnormal uterine and vaginal bleeding, unspecified: Secondary | ICD-10-CM

## 2013-07-10 DIAGNOSIS — N949 Unspecified condition associated with female genital organs and menstrual cycle: Secondary | ICD-10-CM | POA: Insufficient documentation

## 2013-07-10 DIAGNOSIS — F319 Bipolar disorder, unspecified: Secondary | ICD-10-CM | POA: Insufficient documentation

## 2013-07-10 DIAGNOSIS — N390 Urinary tract infection, site not specified: Secondary | ICD-10-CM

## 2013-07-10 DIAGNOSIS — R112 Nausea with vomiting, unspecified: Secondary | ICD-10-CM | POA: Insufficient documentation

## 2013-07-10 DIAGNOSIS — N938 Other specified abnormal uterine and vaginal bleeding: Secondary | ICD-10-CM | POA: Insufficient documentation

## 2013-07-10 LAB — COMPREHENSIVE METABOLIC PANEL
ALT: 29 U/L (ref 0–35)
AST: 23 U/L (ref 0–37)
Albumin: 3.5 g/dL (ref 3.5–5.2)
Alkaline Phosphatase: 93 U/L (ref 39–117)
BUN: 8 mg/dL (ref 6–23)
CO2: 23 mEq/L (ref 19–32)
Calcium: 8.9 mg/dL (ref 8.4–10.5)
Chloride: 100 mEq/L (ref 96–112)
Creatinine, Ser: 0.76 mg/dL (ref 0.50–1.10)
GFR calc Af Amer: >90 mL/min (ref >90)
GFR calc non Af Amer: >90 mL/min (ref >90)
Glucose, Bld: 90 mg/dL (ref 70–99)
Potassium: 3.7 mEq/L (ref 3.5–5.1)
Sodium: 135 mEq/L (ref 135–145)
Total Bilirubin: 0.2 mg/dL — ABNORMAL LOW (ref 0.3–1.2)
Total Protein: 7 g/dL (ref 6.0–8.3)

## 2013-07-10 LAB — CBC WITH DIFFERENTIAL/PLATELET
Basophils Absolute: 0 10*3/uL (ref 0.0–0.1)
Basophils Relative: 0 % (ref 0–1)
Eosinophils Absolute: 0.3 10*3/uL (ref 0.0–0.7)
Eosinophils Relative: 2 % (ref 0–5)
HCT: 41.1 % (ref 36.0–46.0)
Hemoglobin: 14.2 g/dL (ref 12.0–15.0)
Lymphocytes Relative: 17 % (ref 12–46)
Lymphs Abs: 2.4 10*3/uL (ref 0.7–4.0)
MCH: 32.6 pg (ref 26.0–34.0)
MCHC: 34.5 g/dL (ref 30.0–36.0)
MCV: 94.5 fL (ref 78.0–100.0)
Monocytes Absolute: 0.8 10*3/uL (ref 0.1–1.0)
Monocytes Relative: 6 % (ref 3–12)
Neutro Abs: 10.2 10*3/uL — ABNORMAL HIGH (ref 1.7–7.7)
Neutrophils Relative %: 74 % (ref 43–77)
Platelets: 230 10*3/uL (ref 150–400)
RBC: 4.35 MIL/uL (ref 3.87–5.11)
RDW: 14 % (ref 11.5–15.5)
WBC: 13.8 10*3/uL — ABNORMAL HIGH (ref 4.0–10.5)

## 2013-07-10 LAB — URINALYSIS, ROUTINE W REFLEX MICROSCOPIC
Glucose, UA: NEGATIVE mg/dL
Ketones, ur: 15 mg/dL — AB
Nitrite: POSITIVE — AB
Protein, ur: >300 mg/dL — AB
Specific Gravity, Urine: 1.024 (ref 1.005–1.030)
Urobilinogen, UA: 1 mg/dL (ref 0.0–1.0)
pH: 5 (ref 5.0–8.0)

## 2013-07-10 LAB — URINE MICROSCOPIC-ADD ON

## 2013-07-10 LAB — LIPASE, BLOOD: Lipase: 38 U/L (ref 11–59)

## 2013-07-10 LAB — PREGNANCY, URINE: Preg Test, Ur: NEGATIVE

## 2013-07-10 MED ORDER — HYDROMORPHONE HCL PF 1 MG/ML IJ SOLN
1.0000 mg | Freq: Once | INTRAMUSCULAR | Status: AC
  Administered 2013-07-10: 1 mg via INTRAVENOUS
  Filled 2013-07-10: qty 1

## 2013-07-10 MED ORDER — ONDANSETRON HCL 4 MG/2ML IJ SOLN
4.0000 mg | Freq: Once | INTRAMUSCULAR | Status: AC
  Administered 2013-07-10: 4 mg via INTRAVENOUS
  Filled 2013-07-10: qty 2

## 2013-07-10 MED ORDER — MEGESTROL ACETATE 40 MG PO TABS: 40.0000 mg | ORAL_TABLET | Freq: Two times a day (BID) | ORAL | Status: DC

## 2013-07-10 MED ORDER — CEPHALEXIN 500 MG PO CAPS: 500.0000 mg | ORAL_CAPSULE | Freq: Four times a day (QID) | ORAL | Status: DC

## 2013-07-10 MED ORDER — SODIUM CHLORIDE 0.9 % IV SOLN: INTRAVENOUS | Status: DC

## 2013-07-10 MED ORDER — DEXTROSE 5 % IV SOLN
1.0000 g | Freq: Once | INTRAVENOUS | Status: AC
  Administered 2013-07-10: 1 g via INTRAVENOUS
  Filled 2013-07-10: qty 10

## 2013-07-10 MED ORDER — IOHEXOL 300 MG/ML  SOLN
80.0000 mL | Freq: Once | INTRAMUSCULAR | Status: AC | PRN
  Administered 2013-07-10: 80 mL via INTRAVENOUS

## 2013-07-10 MED ORDER — IOHEXOL 300 MG/ML  SOLN
25.0000 mL | INTRAMUSCULAR | Status: AC
  Administered 2013-07-10: 25 mL via ORAL

## 2013-07-10 MED ORDER — SODIUM CHLORIDE 0.9 % IV BOLUS (SEPSIS)
1000.0000 mL | Freq: Once | INTRAVENOUS | Status: AC
  Administered 2013-07-10: 1000 mL via INTRAVENOUS

## 2013-07-10 MED ORDER — HYDROCODONE-ACETAMINOPHEN 5-325 MG PO TABS: 1.0000 | ORAL_TABLET | Freq: Four times a day (QID) | ORAL | Status: DC | PRN

## 2013-07-10 NOTE — ED Notes (Signed)
Dad and brother in room pt up to  BR to ct

## 2013-07-10 NOTE — ED Notes (Signed)
Pt writhing on bed states that she started her period 3 weeks ago and it has not stopped yesterday she started w/ back paIN LEFT AND NOW SHE HAS rt abd pain states she is passing 50 cent sized clots and her stools are black

## 2013-07-10 NOTE — ED Provider Notes (Signed)
CSN: [id number]     Arrival date & time 07/10/13  1141 History   First MD Initiated Contact with Patient 07/10/13 1208     Chief Complaint  Patient presents with  . Vaginal Bleeding  . Rectal Bleeding   (Consider location/radiation/quality/duration/timing/severity/associated sxs/prior Treatment) Patient is a 32 y.o. female presenting with vaginal bleeding and hematochezia. The history is provided by the patient.  Vaginal Bleeding Associated symptoms: abdominal pain, back pain and nausea   Associated symptoms: no fever and no vaginal discharge   Rectal Bleeding Associated symptoms: abdominal pain and vomiting   Associated symptoms: no fever    32 year old female history of vaginal bleeding for 3 weeks. Initially crampy lower abdominal pain now more of a constant pain. Some mild discomfort in the back. Vaginal bleeding is dark in color with passage of clots. Had vomiting 5 days ago none since. Also noted over the past week the stools have turned black in color. No fever or chills no history of similar problem. Started with the normal time of her menstrual period and discontinued. Pain is currently 10 out of 10. Crampy ache in nature not made better or worse by anything radiating to the back.  Past Medical History  Diagnosis Date  . Anxiety   . Bipolar 1 disorder    Past Surgical History  Procedure Laterality Date  . Tubal ligation     History reviewed. No pertinent family history. History  Substance Use Topics  . Smoking status: Never Smoker   . Smokeless tobacco: Not on file  . Alcohol Use: No   OB History   Grav Para Term Preterm Abortions TAB SAB Ect Mult Living                 Review of Systems  Constitutional: Positive for appetite change. Negative for fever.  HENT: Positive for congestion.   Eyes: Negative for redness.  Respiratory: Negative for shortness of breath.   Cardiovascular: Negative for chest pain.  Gastrointestinal: Positive for nausea, vomiting, abdominal  pain, hematochezia and anal bleeding.  Genitourinary: Positive for hematuria and vaginal bleeding. Negative for vaginal discharge.  Musculoskeletal: Positive for back pain.  Skin: Negative for rash.  Neurological: Negative for headaches.  Hematological: Does not bruise/bleed easily.  Psychiatric/Behavioral: Negative for confusion.    Allergies  Review of patient's allergies indicates no known allergies.  Home Medications   Current Outpatient Rx  Name  Route  Sig  Dispense  Refill  . acetaminophen (TYLENOL) 500 MG tablet   Oral   Take 1,000 mg by mouth every 6 (six) hours as needed for pain.         Marland Kitchen gabapentin (NEURONTIN) 800 MG tablet   Oral   Take 800 mg by mouth 3 (three) times daily.         Marland Kitchen HYDROcodone-acetaminophen (NORCO/VICODIN) 5-325 MG per tablet   Oral   Take 1 tablet by mouth every 6 (six) hours as needed for pain.   12 tablet   0   . ibuprofen (ADVIL,MOTRIN) 200 MG tablet   Oral   Take 800 mg by mouth every 6 (six) hours as needed for pain.         Marland Kitchen venlafaxine XR (EFFEXOR-XR) 150 MG 24 hr capsule   Oral   Take 150 mg by mouth daily.           BP 131/96  Pulse 117  Temp(Src) 98.1 F (36.7 C) (Oral)  Resp 22  Ht 5\' 8"  (1.727 m)  Wt 200 lb (90.719 kg)  BMI 30.42 kg/m2  SpO2 95%  LMP 07/10/2013 Physical Exam  Nursing note and vitals reviewed. Constitutional: She is oriented to person, place, and time. She appears well-developed and well-nourished. She appears distressed.  HENT:  Head: Normocephalic and atraumatic.  Mouth/Throat: Oropharynx is clear and moist.  Eyes: Conjunctivae and EOM are normal. Pupils are equal, round, and reactive to light.  Neck: Normal range of motion.  Cardiovascular: Regular rhythm and normal heart sounds.   No murmur heard. Tachycardic.  Pulmonary/Chest: Effort normal and breath sounds normal.  Abdominal: Soft. Bowel sounds are normal. She exhibits no mass. There is tenderness.  Tender left lower quadrant.  Mild tenderness suprapubic area. No guarding.  Genitourinary: Uterus normal.  Vaginal bleeding with dark blood no clots. No cervical motion tenderness. Uterus nontender. Right adnexa nontender left adnexal tenderness. Not able to appreciate a mass.  Rectal exam was done no mass stool was light brown. Guaiac not performed because of her so much vaginal bleeding that would've contaminated.  Musculoskeletal: Normal range of motion. She exhibits no edema.  Neurological: She is alert and oriented to person, place, and time. No cranial nerve deficit. She exhibits normal muscle tone. Coordination normal.  Skin: Skin is warm. No rash noted.    ED Course  Procedures (including critical care time) Labs Review Labs Reviewed  URINALYSIS, ROUTINE W REFLEX MICROSCOPIC - Abnormal; Notable for the following:    Color, Urine RED (*)    APPearance TURBID (*)    Hgb urine dipstick LARGE (*)    Bilirubin Urine MODERATE (*)    Ketones, ur 15 (*)    Protein, ur >300 (*)    Nitrite POSITIVE (*)    Leukocytes, UA MODERATE (*)    All other components within normal limits  CBC WITH DIFFERENTIAL - Abnormal; Notable for the following:    WBC 13.8 (*)    Neutro Abs 10.2 (*)    All other components within normal limits  COMPREHENSIVE METABOLIC PANEL - Abnormal; Notable for the following:    Total Bilirubin 0.2 (*)    All other components within normal limits  URINE MICROSCOPIC-ADD ON - Abnormal; Notable for the following:    Squamous Epithelial / LPF FEW (*)    Bacteria, UA FEW (*)    All other components within normal limits  GC/CHLAMYDIA PROBE AMP  URINE CULTURE  LIPASE, BLOOD  PREGNANCY, URINE   Results for orders placed during the hospital encounter of 07/10/13  URINALYSIS, ROUTINE W REFLEX MICROSCOPIC      Result Value Range   Color, Urine RED (*) YELLOW   APPearance TURBID (*) CLEAR   Specific Gravity, Urine 1.024  1.005 - 1.030   pH 5.0  5.0 - 8.0   Glucose, UA NEGATIVE  NEGATIVE mg/dL   Hgb  urine dipstick LARGE (*) NEGATIVE   Bilirubin Urine MODERATE (*) NEGATIVE   Ketones, ur 15 (*) NEGATIVE mg/dL   Protein, ur >782 (*) NEGATIVE mg/dL   Urobilinogen, UA 1.0  0.0 - 1.0 mg/dL   Nitrite POSITIVE (*) NEGATIVE   Leukocytes, UA MODERATE (*) NEGATIVE  CBC WITH DIFFERENTIAL      Result Value Range   WBC 13.8 (*) 4.0 - 10.5 K/uL   RBC 4.35  3.87 - 5.11 MIL/uL   Hemoglobin 14.2  12.0 - 15.0 g/dL   HCT 95.6  21.3 - 08.6 %   MCV 94.5  78.0 - 100.0 fL   MCH 32.6  26.0 - 34.0  pg   MCHC 34.5  30.0 - 36.0 g/dL   RDW 78.2  95.6 - 21.3 %   Platelets 230  150 - 400 K/uL   Neutrophils Relative % 74  43 - 77 %   Neutro Abs 10.2 (*) 1.7 - 7.7 K/uL   Lymphocytes Relative 17  12 - 46 %   Lymphs Abs 2.4  0.7 - 4.0 K/uL   Monocytes Relative 6  3 - 12 %   Monocytes Absolute 0.8  0.1 - 1.0 K/uL   Eosinophils Relative 2  0 - 5 %   Eosinophils Absolute 0.3  0.0 - 0.7 K/uL   Basophils Relative 0  0 - 1 %   Basophils Absolute 0.0  0.0 - 0.1 K/uL  COMPREHENSIVE METABOLIC PANEL      Result Value Range   Sodium 135  135 - 145 mEq/L   Potassium 3.7  3.5 - 5.1 mEq/L   Chloride 100  96 - 112 mEq/L   CO2 23  19 - 32 mEq/L   Glucose, Bld 90  70 - 99 mg/dL   BUN 8  6 - 23 mg/dL   Creatinine, Ser 0.86  0.50 - 1.10 mg/dL   Calcium 8.9  8.4 - 57.8 mg/dL   Total Protein 7.0  6.0 - 8.3 g/dL   Albumin 3.5  3.5 - 5.2 g/dL   AST 23  0 - 37 U/L   ALT 29  0 - 35 U/L   Alkaline Phosphatase 93  39 - 117 U/L   Total Bilirubin 0.2 (*) 0.3 - 1.2 mg/dL   GFR calc non Af Amer >90  >90 mL/min   GFR calc Af Amer >90  >90 mL/min  LIPASE, BLOOD      Result Value Range   Lipase 38  11 - 59 U/L  PREGNANCY, URINE      Result Value Range   Preg Test, Ur NEGATIVE  NEGATIVE  URINE MICROSCOPIC-ADD ON      Result Value Range   Squamous Epithelial / LPF FEW (*) RARE   WBC, UA 21-50  <3 WBC/hpf   RBC / HPF TOO NUMEROUS TO COUNT  <3 RBC/hpf   Bacteria, UA FEW (*) RARE   Urine-Other MUCOUS PRESENT      Imaging  Review Dg Chest 2 View  07/10/2013   CLINICAL DATA:  Pain.  EXAM: CHEST  2 VIEW  COMPARISON:  03/25/2013.  FINDINGS: Mediastinum and hilar structures are normal. Lungs are clear. No pleural effusion or pneumothorax. Heart size and pulmonary vascularity normal. Stable deformity of the hemidiaphragm on the right posteriorly noted.  IMPRESSION: No acute abnormality.   Electronically Signed   By: Maisie Fus  Register   On: 07/10/2013 13:38    EKG Interpretation   None       MDM   1. Abnormal vaginal bleeding   2. UTI (lower urinary tract infection)      Patient with extensive workup for the vaginal bleeding to include pelvic exam. Patient with extensive vaginal bleeding but no clots. Patient CT of the abdomen and pelvis was negative ultrasound of the pelvis was negative. Patient's diagnosis is probably dysfunctional vaginal bleeding discussed with on-call OB/GYN they recommend Megace. Patient also treated with Keflex for the urinary tract infection. Patient also given pain medication hydrocodone to take at home. [facility] for followup for her. Patient understands importance of getting followup. Patient's hemoglobin and hematocrit without any evidence of significant blood loss  despite the history of bleeding vaginally for 3 weeks.  Shelda Jakes, MD 07/10/13 [phone]

## 2013-07-10 NOTE — ED Notes (Signed)
Per pt sts 3 weeks of vaginal bleeding and now large clots out of vagina. sts also some dark stools 3 days ago. sts some vomiting and RLQ pain.

## 2013-07-11 LAB — GC/CHLAMYDIA PROBE AMP
CT Probe RNA: NEGATIVE
GC Probe RNA: NEGATIVE

## 2013-07-11 LAB — URINE CULTURE: Colony Count: 95000

## 2014-02-16 ENCOUNTER — Encounter (HOSPITAL_COMMUNITY): Payer: Self-pay | Admitting: Emergency Medicine

## 2014-02-16 ENCOUNTER — Emergency Department (HOSPITAL_COMMUNITY): Payer: Medicaid Other

## 2014-02-16 ENCOUNTER — Inpatient Hospital Stay (HOSPITAL_COMMUNITY)
Admission: EM | Admit: 2014-02-16 | Discharge: 2014-02-20 | DRG: 603 | Disposition: A | Discharge status: Home / Self Care | Payer: Medicaid Other | Attending: Internal Medicine | Admitting: Internal Medicine

## 2014-02-16 DIAGNOSIS — L02519 Cutaneous abscess of unspecified hand: Secondary | ICD-10-CM | POA: Diagnosis present

## 2014-02-16 DIAGNOSIS — M609 Myositis, unspecified: Secondary | ICD-10-CM | POA: Diagnosis present

## 2014-02-16 DIAGNOSIS — F411 Generalized anxiety disorder: Secondary | ICD-10-CM | POA: Diagnosis present

## 2014-02-16 DIAGNOSIS — IMO0002 Reserved for concepts with insufficient information to code with codable children: Principal | ICD-10-CM | POA: Diagnosis present

## 2014-02-16 DIAGNOSIS — L03119 Cellulitis of unspecified part of limb: Secondary | ICD-10-CM

## 2014-02-16 DIAGNOSIS — F319 Bipolar disorder, unspecified: Secondary | ICD-10-CM | POA: Diagnosis present

## 2014-02-16 DIAGNOSIS — F32A Depression, unspecified: Secondary | ICD-10-CM | POA: Diagnosis present

## 2014-02-16 DIAGNOSIS — F419 Anxiety disorder, unspecified: Secondary | ICD-10-CM

## 2014-02-16 DIAGNOSIS — L03012 Cellulitis of left finger: Secondary | ICD-10-CM

## 2014-02-16 DIAGNOSIS — Z79899 Other long term (current) drug therapy: Secondary | ICD-10-CM | POA: Diagnosis not present

## 2014-02-16 DIAGNOSIS — IMO0001 Reserved for inherently not codable concepts without codable children: Secondary | ICD-10-CM

## 2014-02-16 DIAGNOSIS — M79A9 Nontraumatic compartment syndrome of other sites: Secondary | ICD-10-CM

## 2014-02-16 DIAGNOSIS — M79609 Pain in unspecified limb: Secondary | ICD-10-CM | POA: Diagnosis not present

## 2014-02-16 DIAGNOSIS — F329 Major depressive disorder, single episode, unspecified: Secondary | ICD-10-CM

## 2014-02-16 DIAGNOSIS — L0390 Cellulitis, unspecified: Secondary | ICD-10-CM | POA: Diagnosis present

## 2014-02-16 LAB — C-REACTIVE PROTEIN: CRP: 1.3 mg/dL — ABNORMAL HIGH (ref <0.60)

## 2014-02-16 LAB — CBC WITH DIFFERENTIAL/PLATELET
Basophils Absolute: 0 10*3/uL (ref 0.0–0.1)
Basophils Relative: 0 % (ref 0–1)
Eosinophils Absolute: 0 10*3/uL (ref 0.0–0.7)
Eosinophils Relative: 0 % (ref 0–5)
HCT: 47 % — ABNORMAL HIGH (ref 36.0–46.0)
Hemoglobin: 15.5 g/dL — ABNORMAL HIGH (ref 12.0–15.0)
Lymphocytes Relative: 17 % (ref 12–46)
Lymphs Abs: 1.8 10*3/uL (ref 0.7–4.0)
MCH: 31.1 pg (ref 26.0–34.0)
MCHC: 33 g/dL (ref 30.0–36.0)
MCV: 94.2 fL (ref 78.0–100.0)
Monocytes Absolute: 1.1 10*3/uL — ABNORMAL HIGH (ref 0.1–1.0)
Monocytes Relative: 11 % (ref 3–12)
Neutro Abs: 7.3 10*3/uL (ref 1.7–7.7)
Neutrophils Relative %: 72 % (ref 43–77)
Platelets: 246 10*3/uL (ref 150–400)
RBC: 4.99 MIL/uL (ref 3.87–5.11)
RDW: 14.6 % (ref 11.5–15.5)
WBC: 10.1 10*3/uL (ref 4.0–10.5)

## 2014-02-16 LAB — BASIC METABOLIC PANEL
Anion gap: 15 (ref 5–15)
BUN: <3 mg/dL — ABNORMAL LOW (ref 6–23)
CO2: 24 mEq/L (ref 19–32)
Calcium: 9.2 mg/dL (ref 8.4–10.5)
Chloride: 103 mEq/L (ref 96–112)
Creatinine, Ser: 0.73 mg/dL (ref 0.50–1.10)
GFR calc Af Amer: >90 mL/min (ref >90)
GFR calc non Af Amer: >90 mL/min (ref >90)
Glucose, Bld: 98 mg/dL (ref 70–99)
Potassium: 4.5 mEq/L (ref 3.7–5.3)
Sodium: 142 mEq/L (ref 137–147)

## 2014-02-16 LAB — SEDIMENTATION RATE: Sed Rate: 7 mm/hr (ref 0–22)

## 2014-02-16 MED ORDER — MELOXICAM 15 MG PO TABS
15.0000 mg | ORAL_TABLET | Freq: Every day | ORAL | Status: DC
  Administered 2014-02-16 – 2014-02-20 (×5): 15 mg via ORAL
  Filled 2014-02-16 (×5): qty 1

## 2014-02-16 MED ORDER — SODIUM CHLORIDE 0.9 % IJ SOLN: 3.0000 mL | INTRAMUSCULAR | Status: DC | PRN

## 2014-02-16 MED ORDER — ONDANSETRON HCL 4 MG PO TABS: 4.0000 mg | ORAL_TABLET | Freq: Four times a day (QID) | ORAL | Status: DC | PRN

## 2014-02-16 MED ORDER — VENLAFAXINE HCL ER 75 MG PO CP24
75.0000 mg | ORAL_CAPSULE | Freq: Every day | ORAL | Status: DC
  Administered 2014-02-17 – 2014-02-20 (×4): 75 mg via ORAL
  Filled 2014-02-16 (×5): qty 1

## 2014-02-16 MED ORDER — HEPARIN SODIUM (PORCINE) 5000 UNIT/ML IJ SOLN
5000.0000 [IU] | Freq: Three times a day (TID) | INTRAMUSCULAR | Status: DC
  Administered 2014-02-16 – 2014-02-19 (×10): 5000 [IU] via SUBCUTANEOUS
  Filled 2014-02-16 (×16): qty 1

## 2014-02-16 MED ORDER — SODIUM CHLORIDE 0.9 % IV SOLN
250.0000 mL | INTRAVENOUS | Status: DC | PRN
  Administered 2014-02-16: 250 mL via INTRAVENOUS

## 2014-02-16 MED ORDER — MORPHINE SULFATE 4 MG/ML IJ SOLN
4.0000 mg | Freq: Once | INTRAMUSCULAR | Status: AC
  Administered 2014-02-16: 4 mg via INTRAVENOUS
  Filled 2014-02-16: qty 1

## 2014-02-16 MED ORDER — VENLAFAXINE HCL ER 150 MG PO CP24
150.0000 mg | ORAL_CAPSULE | Freq: Every day | ORAL | Status: DC
  Administered 2014-02-17 – 2014-02-20 (×4): 150 mg via ORAL
  Filled 2014-02-16 (×5): qty 1

## 2014-02-16 MED ORDER — ONDANSETRON HCL 4 MG/2ML IJ SOLN
4.0000 mg | Freq: Once | INTRAMUSCULAR | Status: AC
  Administered 2014-02-16: 4 mg via INTRAVENOUS
  Filled 2014-02-16: qty 2

## 2014-02-16 MED ORDER — CLINDAMYCIN PHOSPHATE 600 MG/50ML IV SOLN
600.0000 mg | Freq: Once | INTRAVENOUS | Status: AC
  Administered 2014-02-16: 600 mg via INTRAVENOUS
  Filled 2014-02-16: qty 50

## 2014-02-16 MED ORDER — GABAPENTIN 400 MG PO CAPS
800.0000 mg | ORAL_CAPSULE | Freq: Three times a day (TID) | ORAL | Status: DC
  Administered 2014-02-16 – 2014-02-20 (×13): 800 mg via ORAL
  Filled 2014-02-16 (×14): qty 2

## 2014-02-16 MED ORDER — ONDANSETRON HCL 4 MG/2ML IJ SOLN: 4.0000 mg | Freq: Four times a day (QID) | INTRAMUSCULAR | Status: DC | PRN

## 2014-02-16 MED ORDER — MORPHINE SULFATE 2 MG/ML IJ SOLN
1.0000 mg | INTRAMUSCULAR | Status: DC | PRN
  Administered 2014-02-16 – 2014-02-18 (×9): 1 mg via INTRAVENOUS
  Filled 2014-02-16 (×9): qty 1

## 2014-02-16 MED ORDER — CEFAZOLIN SODIUM-DEXTROSE 2-3 GM-% IV SOLR
2.0000 g | Freq: Three times a day (TID) | INTRAVENOUS | Status: DC
  Administered 2014-02-17 (×2): 2 g via INTRAVENOUS
  Filled 2014-02-16 (×4): qty 50

## 2014-02-16 MED ORDER — HYDROMORPHONE HCL PF 1 MG/ML IJ SOLN
1.0000 mg | Freq: Once | INTRAMUSCULAR | Status: AC
  Administered 2014-02-16: 1 mg via INTRAVENOUS
  Filled 2014-02-16: qty 1

## 2014-02-16 MED ORDER — CEFAZOLIN SODIUM 1-5 GM-% IV SOLN
1.0000 g | Freq: Three times a day (TID) | INTRAVENOUS | Status: DC
  Administered 2014-02-16: 1 g via INTRAVENOUS
  Filled 2014-02-16: qty 50

## 2014-02-16 MED ORDER — SODIUM CHLORIDE 0.9 % IV BOLUS (SEPSIS)
1000.0000 mL | Freq: Once | INTRAVENOUS | Status: AC
  Administered 2014-02-16: 1000 mL via INTRAVENOUS

## 2014-02-16 MED ORDER — GADOBENATE DIMEGLUMINE 529 MG/ML IV SOLN
15.0000 mL | Freq: Once | INTRAVENOUS | Status: AC
  Administered 2014-02-16: 15 mL via INTRAVENOUS

## 2014-02-16 MED ORDER — HEPARIN SODIUM (PORCINE) 5000 UNIT/ML IJ SOLN: 5000.0000 [IU] | Freq: Three times a day (TID) | INTRAMUSCULAR | Status: DC

## 2014-02-16 MED ORDER — GABAPENTIN 800 MG PO TABS
800.0000 mg | ORAL_TABLET | Freq: Three times a day (TID) | ORAL | Status: DC
  Filled 2014-02-16 (×2): qty 1

## 2014-02-16 MED ORDER — ARIPIPRAZOLE 2 MG PO TABS
2.0000 mg | ORAL_TABLET | Freq: Every day | ORAL | Status: DC
  Administered 2014-02-17 – 2014-02-20 (×4): 2 mg via ORAL
  Filled 2014-02-16 (×4): qty 1

## 2014-02-16 MED ORDER — OXYCODONE HCL 5 MG PO TABS
5.0000 mg | ORAL_TABLET | ORAL | Status: DC | PRN
  Administered 2014-02-16 – 2014-02-18 (×9): 5 mg via ORAL
  Filled 2014-02-16 (×9): qty 1

## 2014-02-16 MED ORDER — SODIUM CHLORIDE 0.9 % IJ SOLN
3.0000 mL | Freq: Two times a day (BID) | INTRAMUSCULAR | Status: DC
  Administered 2014-02-18 – 2014-02-19 (×4): 3 mL via INTRAVENOUS

## 2014-02-16 NOTE — H&P (Signed)
[facility] History and Physical  [name] AVW:[id number] DOB: [birth year] DOA: 02/16/2014  Referring physician: Dr. Juleen China PCP: Default, Provider, MD   Chief Complaint: Left hand pain  HPI: [name] is a 33 y.o. female  With a prior history of anxiety and depression who presents with two-day complaint of left arm discomfort. The discomfort is located in the dorsum of the arm and hand. Nothing she is aware of makes it better. Moving her hand makes it worse and pressure over affected area makes it worse as well. She denies any recent trauma to the area. Patient states that since she first noticed the affected area the discomfort has progressively gotten worse.  While in the ED patient had MRI and the ED physician consulted hand surgeon. MRI reported cellulitis and extensor compartment myositis/fasciitis in the forearm.   Review of Systems:  Constitutional:  No weight loss, night sweats, Fevers, chills, fatigue.  HEENT:  No headaches, Difficulty swallowing,Tooth/dental problems,Sore throat,  No sneezing, itching, ear ache, nasal congestion, post nasal drip,  Cardio-vascular:  No chest pain, Orthopnea, PND, swelling in lower extremities, anasarca, dizziness, palpitations  GI:  No heartburn, indigestion, abdominal pain, nausea, vomiting, diarrhea, change in bowel habits, loss of appetite  Resp:  No shortness of breath with exertion or at rest. No excess mucus, no productive cough, No non-productive cough, No coughing up of blood.No change in color of mucus.No wheezing.No chest wall deformity  Skin:  no rash or lesions.  GU:  no dysuria, change in color of urine, no urgency or frequency. No flank pain.  Musculoskeletal:  No joint pain or swelling. + decreased range of motion at left forearm secondary to the pain. No back pain.  Psych:  No change in mood or affect. No depression or anxiety. No memory loss.   Past Medical History  Diagnosis Date  . Anxiety   .  Bipolar 1 disorder    Past Surgical History  Procedure Laterality Date  . Tubal ligation     Social History:  reports that she has never smoked. She does not have any smokeless tobacco history on file. She reports that she does not drink alcohol or use illicit drugs.  Allergies  Allergen Reactions  . Latex Itching    gloves    History reviewed. No pertinent family history.   Prior to Admission medications   Medication Sig Start Date End Date Taking? Authorizing Provider  ARIPiprazole (ABILIFY) 2 MG tablet Take 2 mg by mouth daily.   Yes Historical Provider, MD  gabapentin (NEURONTIN) 800 MG tablet Take 800 mg by mouth 3 (three) times daily.   Yes Historical Provider, MD  ibuprofen (ADVIL,MOTRIN) 200 MG tablet Take 800 mg by mouth every 6 (six) hours as needed for pain.   Yes Historical Provider, MD  venlafaxine XR (EFFEXOR-XR) 150 MG 24 hr capsule Take 150 mg by mouth daily. Take with 75mg  capsule for a total of 225mg .   Yes Historical Provider, MD  venlafaxine XR (EFFEXOR-XR) 75 MG 24 hr capsule Take 75 mg by mouth daily with breakfast. Take with 150mg  capsule for a total of 225mg .   Yes Historical Provider, MD   Physical Exam: Filed Vitals:   02/16/14 1606  BP: 119/71  Pulse: 78  Temp:   Resp:     BP 119/71  Pulse 78  Temp(Src) 98.6 F (37 C) (Oral)  Resp 16  Ht 5\' 8"  (1.727 m)  Wt 74.844 kg (165 lb)  BMI 25.09 kg/m2  SpO2  96%  General:  Appears calm and comfortable Eyes: PERRL, normal lids, irises & conjunctiva ENT: grossly normal hearing, lips & tongue Neck: no LAD, masses or thyromegaly Cardiovascular: RRR, no m/r/g. No LE edema. Telemetry: SR, no arrhythmias  Respiratory: CTA bilaterally, no w/r/r. Normal respiratory effort. Abdomen: soft, ntnd Skin: Rubor in caliber over left forearm extending to dorsum of hand Musculoskeletal: Limited range of motion at left wrist secondary to pain, pain on palpation over left forearm and left dorsum of hand Psychiatric:  grossly normal mood and affect, speech fluent and appropriate Neurologic: grossly non-focal.          Labs on Admission:  Basic Metabolic Panel:  Recent Labs Lab 02/16/14 1100  NA 142  K 4.5  CL 103  CO2 24  GLUCOSE 98  BUN <3*  CREATININE 0.73  CALCIUM 9.2   Liver Function Tests: No results found for this basename: AST, ALT, ALKPHOS, BILITOT, PROT, ALBUMIN,  in the last 168 hours No results found for this basename: LIPASE, AMYLASE,  in the last 168 hours No results found for this basename: AMMONIA,  in the last 168 hours CBC:  Recent Labs Lab 02/16/14 1100  WBC 10.1  NEUTROABS 7.3  HGB 15.5*  HCT 47.0*  MCV 94.2  PLT 246   Cardiac Enzymes: No results found for this basename: CKTOTAL, CKMB, CKMBINDEX, TROPONINI,  in the last 168 hours  BNP (last 3 results) No results found for this basename: PROBNP,  in the last 8760 hours CBG: No results found for this basename: GLUCAP,  in the last 168 hours  Radiological Exams on Admission: Mr Forearm Left W/cm  02/16/2014   CLINICAL DATA:  Erythema, pain, and swelling of the left forearm.  EXAM: MRI OF THE LEFT FOREARM WITH CONTRAST  TECHNIQUE: Multiplanar, multisequence MR imaging was performed following the administration of intravenous contrast. A custom protocol to survey the distal forearm and proximal hand was utilized, but orthopedic wrist or hand protocol was not included.  CONTRAST:  15mL MULTIHANCE GADOBENATE DIMEGLUMINE 529 MG/ML IV SOLN  COMPARISON:  09/14/2003 radiographs  FINDINGS: Abnormal edema spans from the proximal -medial forearm and into the hand and fingers within the subcutaneous tissues, with associated abnormal enhancement favoring cellulitis. There is also abnormal edema and enhancement tracking in the extensor forearm musculature, especially the extensor digitorum but also within along the remaining extensor muscles. No drainable abscess is directly observed. Extensor peritendinitis is confluent with the  subcutaneous edema and enhancement.  No flexor compartment edema or enhancement noted. No significant abnormal osseous edema.  IMPRESSION: 1. Cellulitis and extensor compartment myositis/fasciitis in the forearm, with the cellulitis component extending into the dorsal hand. No current drainable abscess.   Electronically Signed   By: Herbie BaltimoreWalt  Liebkemann M.D.   On: 02/16/2014 15:06    Assessment/Plan Active Problems:   Myositis/cellulitis - Consulted ID - Plan is for IV Ancef - Blood cultures - ED consulted hand surgeon  Depression and anxiety -Stable continue home regimen  DVT prophylaxis -Heparin   Code Status: Full history Family Communication: Discussed with father and patient Disposition Plan: MedSurg  Time spent: > 55 minutes  Penny PiaVEGA, Angeliah Wisdom [facility] Pager [phone]  **Disclaimer: This note may have been dictated with voice recognition software. Similar sounding words can inadvertently be transcribed and this note may contain transcription errors which may not have been corrected upon publication of note.**

## 2014-02-16 NOTE — ED Notes (Signed)
Hand elevated.

## 2014-02-16 NOTE — ED Notes (Signed)
Pt placed into gown and placed on monitor upon arrival to room. Pt monitored by blood pressure and pulse ox.   

## 2014-02-16 NOTE — ED Notes (Signed)
[facility]

## 2014-02-16 NOTE — ED Notes (Addendum)
Inaccurate spo2. Actual 98% pt alert and talkative.

## 2014-02-16 NOTE — ED Notes (Signed)
Pt remains in mri.

## 2014-02-16 NOTE — ED Provider Notes (Signed)
CSN: [id number]     Arrival date & time 02/16/14  [id number] History  This chart was scribed for non-physician practitioner, Emilia BeckKaitlyn Russie Gulledge, PA-C working with Raeford RazorStephen Kohut, MD by Greggory StallionKayla Andersen, ED scribe. This patient was seen in room TR06C/TR06C and the patient's care was started at 10:21 AM.    Chief Complaint  Patient presents with  . Arm Pain   The history is provided by the patient. No language interpreter was used.   HPI Comments: [name] is a 33 y.o. female who presents to the Emergency Department complaining of left wrist pain and swelling that started 2 days ago. States it worsened when she woke up this morning and spread to her hand and forearm. Denies injury or recent injections. Movement and palpation worsen the pain. Denies fever.   Past Medical History  Diagnosis Date  . Anxiety   . Bipolar 1 disorder    Past Surgical History  Procedure Laterality Date  . Tubal ligation     History reviewed. No pertinent family history. History  Substance Use Topics  . Smoking status: Never Smoker   . Smokeless tobacco: Not on file  . Alcohol Use: No   OB History   Grav Para Term Preterm Abortions TAB SAB Ect Mult Living                 Review of Systems  Constitutional: Negative for fever.  Musculoskeletal: Positive for arthralgias, joint swelling and myalgias.  All other systems reviewed and are negative.  Allergies  Latex  Home Medications   Prior to Admission medications   Medication Sig Start Date End Date Taking? Authorizing Provider  ARIPiprazole (ABILIFY) 2 MG tablet Take 2 mg by mouth daily.   Yes Historical Provider, MD  gabapentin (NEURONTIN) 800 MG tablet Take 800 mg by mouth 3 (three) times daily.   Yes Historical Provider, MD  ibuprofen (ADVIL,MOTRIN) 200 MG tablet Take 800 mg by mouth every 6 (six) hours as needed for pain.   Yes Historical Provider, MD  venlafaxine XR (EFFEXOR-XR) 150 MG 24 hr capsule Take 150 mg by mouth daily. Take with 75mg   capsule for a total of 225mg .   Yes Historical Provider, MD  venlafaxine XR (EFFEXOR-XR) 75 MG 24 hr capsule Take 75 mg by mouth daily with breakfast. Take with 150mg  capsule for a total of 225mg .   Yes Historical Provider, MD   BP 124/77  Pulse 85  Temp(Src) 98.6 F (37 C) (Oral)  Resp 16  Ht 5\' 8"  (1.727 m)  Wt 165 lb (74.844 kg)  BMI 25.09 kg/m2  SpO2 100%  Physical Exam  Nursing note and vitals reviewed. Constitutional: She is oriented to person, place, and time. She appears well-developed and well-nourished. No distress.  HENT:  Head: Normocephalic and atraumatic.  Eyes: Conjunctivae and EOM are normal.  Neck: Neck supple. No tracheal deviation present.  Cardiovascular: Normal rate.   Pulmonary/Chest: Effort normal. No respiratory distress.  Musculoskeletal: Normal range of motion.  Neurological: She is alert and oriented to person, place, and time.  Skin: Skin is warm and dry.  Psychiatric: She has a normal mood and affect. Her behavior is normal.    ED Course  Procedures (including critical care time)  DIAGNOSTIC STUDIES: Oxygen Saturation is 100% on RA, normal by my interpretation.    COORDINATION OF CARE: 10:23 AM-Discussed treatment plan which includes an antibiotic and speaking with Dr. Juleen ChinaKohut with pt at bedside and pt agreed to plan.   Labs Review Labs  Reviewed  CBC WITH DIFFERENTIAL - Abnormal; Notable for the following:    Hemoglobin 15.5 (*)    HCT 47.0 (*)    Monocytes Absolute 1.1 (*)    All other components within normal limits  BASIC METABOLIC PANEL - Abnormal; Notable for the following:    BUN <3 (*)    All other components within normal limits  C-REACTIVE PROTEIN - Abnormal; Notable for the following:    CRP 1.3 (*)    All other components within normal limits  BASIC METABOLIC PANEL - Abnormal; Notable for the following:    BUN 5 (*)    All other components within normal limits  BASIC METABOLIC PANEL - Abnormal; Notable for the following:     Glucose, Bld 111 (*)    BUN 4 (*)    All other components within normal limits  CULTURE, BLOOD (ROUTINE X 2)  CULTURE, BLOOD (ROUTINE X 2)  SEDIMENTATION RATE  CBC  CK  URIC ACID  CK  CBC    Imaging Review No results found.   EKG Interpretation None      MDM   Final diagnoses:  Cellulitis of finger of left hand  Myositis   I spoke with Dr. Amanda Pea about the patient who recommends labs and MRI of the hand to rule out abscess. If the patient has no abscess, she will be admitted to medicine, per Dr. Carlos Levering instructions. Patient given IV Clindamycin and morphine for symptoms.   Patient's labs unremarkable for acute changes. MRI shows no abscess. I spoke with Dr. Cena Benton who refused to admit the patient because "there is nothing to be done in the hospital for her." I spoke with Dr. Juleen China about this plan who disagrees. I re-paged Dr. Cena Benton who will see the patient.   I personally performed the services described in this documentation, which was scribed in my presence. The recorded information has been reviewed and is accurate.  Emilia Beck, PA-C 02/19/14 [address] PA-C 02/19/14 1052

## 2014-02-16 NOTE — Consult Note (Signed)
ANTIBIOTIC CONSULT NOTE - INITIAL  Pharmacy Consult for Cefazolin Indication: cellulitis  Allergies  Allergen Reactions  . Latex Itching    gloves    Patient Measurements: Height: 5\' 8"  (172.7 cm) Weight: 165 lb (74.844 kg) IBW/kg (Calculated) : 63.9  Vital Signs: Temp: 98.6 F (37 C) (07/14 0949) Temp src: Oral (07/14 0949) BP: 119/71 mmHg (07/14 1606) Pulse Rate: 78 (07/14 1606) Intake/Output from previous day:   Intake/Output from this shift:    Labs:  Recent Labs  02/16/14 1100  WBC 10.1  HGB 15.5*  PLT 246  CREATININE 0.73   Estimated Creatinine Clearance: 100 ml/min (by C-G formula based on Cr of 0.73).  Microbiology: No results found for this or any previous visit (from the past 720 hour(s)).  Medical History: Past Medical History  Diagnosis Date  . Anxiety   . Bipolar 1 disorder    Assessment: 33yof to begin cefazolin for left hand cellulitis. ID consult pending. Renal function stable. Received 600mg  IV clindamycin x 1 in the ED.  Goal of Therapy:  Appropriate cefazolin dosing  Plan:  1) Cefazolin 1g IV q8 2) Follow renal function, cultures, LOT, ID recs  [name], [name] 02/16/2014,4:47 PM

## 2014-02-16 NOTE — Consult Note (Signed)
Reason for Consult:cellulitis left hand arm Referring Physician: ER Staff  [name] is an 33 y.o. female.  HPI: Patient presents with a 1-3 day history of advancing cellulitic change in her left hand and distal third of the forearm.  She denies inciting event  She denies foreign travel I trauma or IV drug abuse. She states that this came on all of a sudden and without precipitating cause that she is aware of.  She denies locking popping catching or mechanical symptoms in the hand or arm  She's been admitted for IV antibiotics. She's had an MRI which I personally reviewed which does not show an abscess.  I discussed her all issues.  She denies neck back or chest pain she denies lower extremity  pain  Past Medical History  Diagnosis Date  . Anxiety   . Bipolar 1 disorder     Past Surgical History  Procedure Laterality Date  . Tubal ligation      History reviewed. No pertinent family history.  Social History:  reports that she has never smoked. She does not have any smokeless tobacco history on file. She reports that she does not drink alcohol or use illicit drugs.  Allergies:  Allergies  Allergen Reactions  . Latex Itching    gloves    Medications: I have reviewed the patient's current medications.  Results for orders placed during the hospital encounter of 02/16/14 (from the past 48 hour(s))  CBC WITH DIFFERENTIAL     Status: Abnormal   Collection Time    02/16/14 11:00 AM      Result Value Ref Range   WBC 10.1  4.0 - 10.5 K/uL   RBC 4.99  3.87 - 5.11 MIL/uL   Hemoglobin 15.5 (*) 12.0 - 15.0 g/dL   HCT 47.0 (*) 36.0 - 46.0 %   MCV 94.2  78.0 - 100.0 fL   MCH 31.1  26.0 - 34.0 pg   MCHC 33.0  30.0 - 36.0 g/dL   RDW 14.6  11.5 - 15.5 %   Platelets 246  150 - 400 K/uL   Neutrophils Relative % 72  43 - 77 %   Neutro Abs 7.3  1.7 - 7.7 K/uL   Lymphocytes Relative 17  12 - 46 %   Lymphs Abs 1.8  0.7 - 4.0 K/uL   Monocytes Relative 11  3 - 12 %   Monocytes Absolute 1.1 (*) 0.1 - 1.0 K/uL   Eosinophils Relative 0  0 - 5 %   Eosinophils Absolute 0.0  0.0 - 0.7 K/uL   Basophils Relative 0  0 - 1 %   Basophils Absolute 0.0  0.0 - 0.1 K/uL  BASIC METABOLIC PANEL     Status: Abnormal   Collection Time    02/16/14 11:00 AM      Result Value Ref Range   Sodium 142  137 - 147 mEq/L   Potassium 4.5  3.7 - 5.3 mEq/L   Chloride 103  96 - 112 mEq/L   CO2 24  19 - 32 mEq/L   Glucose, Bld 98  70 - 99 mg/dL   BUN <3 (*) 6 - 23 mg/dL   Creatinine, Ser 0.73  0.50 - 1.10 mg/dL   Calcium 9.2  8.4 - 10.5 mg/dL   GFR calc non Af Amer >90  >90 mL/min   GFR calc Af Amer >90  >90 mL/min   Comment: (NOTE)     The eGFR has been calculated using the CKD EPI  equation.     This calculation has not been validated in all clinical situations.     eGFR's persistently <90 mL/min signify possible Chronic Kidney     Disease.   Anion gap 15  5 - 15  C-REACTIVE PROTEIN     Status: Abnormal   Collection Time    02/16/14 11:25 AM      Result Value Ref Range   CRP 1.3 (*) <0.60 mg/dL   Comment: Performed at [facility]     Status: None   Collection Time    02/16/14 11:25 AM      Result Value Ref Range   Sed Rate 7  0 - 22 mm/hr    Mr Forearm Left W/cm  02/16/2014   CLINICAL DATA:  Erythema, pain, and swelling of the left forearm.  EXAM: MRI OF THE LEFT FOREARM WITH CONTRAST  TECHNIQUE: Multiplanar, multisequence MR imaging was performed following the administration of intravenous contrast. A custom protocol to survey the distal forearm and proximal hand was utilized, but orthopedic wrist or hand protocol was not included.  CONTRAST:  37m MULTIHANCE GADOBENATE DIMEGLUMINE 529 MG/ML IV SOLN  COMPARISON:  09/14/2003 radiographs  FINDINGS: Abnormal edema spans from the proximal -medial forearm and into the hand and fingers within the subcutaneous tissues, with associated abnormal enhancement favoring cellulitis. There is also abnormal  edema and enhancement tracking in the extensor forearm musculature, especially the extensor digitorum but also within along the remaining extensor muscles. No drainable abscess is directly observed. Extensor peritendinitis is confluent with the subcutaneous edema and enhancement.  No flexor compartment edema or enhancement noted. No significant abnormal osseous edema.  IMPRESSION: 1. Cellulitis and extensor compartment myositis/fasciitis in the forearm, with the cellulitis component extending into the dorsal hand. No current drainable abscess.   Electronically Signed   By: WSherryl BartersM.D.   On: 02/16/2014 15:06    Review of Systems  Constitutional: Negative.   Eyes: Negative.   Respiratory: Negative.   Gastrointestinal: Negative.   Genitourinary: Negative.   Neurological: Negative.   Endo/Heme/Allergies: Negative.   Psychiatric/Behavioral:       History bipolar disorder   Blood pressure 129/75, pulse 100, temperature 97.9 F (36.6 C), temperature source Oral, resp. rate 16, height 5' 8" (1.727 m), weight 74.844 kg (165 lb), SpO2 100.00%. Physical Exam patient has cellulitis about the dorsal left hand and distal third of the forearm. There is no evidence of necrotizing fasciitis or compartment syndrome. There is no evidence of instability fracture or tendon injury. There is no skin trauma.  She complains it is difficult to move the fingers however she is able to move the fingers and is stable with attempted range of motion.  I reviewed these issues with her at length. I had not seen obvious abscess. Her thenar hyperthenar and mid palm space are stable. There is no evidence of obvious fluid loculation in the extensor apparatus.  The patient is alert and oriented in no acute distress the patient complains of pain in the affected upper extremity.  The patient is noted to have a normal HEENT exam.  Lung fields show equal chest expansion and no shortness of breath  abdomen exam is nontender  without distention.  Lower extremity examination does not show any fracture dislocation or blood clot symptoms.  Pelvis is stable neck and back are stable and nontender Assessment/Plan: She primarily appears to have cellulitis without obvious abscess. I would agree with IV antibiotics elevation range of  motion to the fingers and cautious observatory care. I will follow along with you.  I discussed with the patient all issues  Paulene Floor 02/16/2014, 6:43 PM

## 2014-02-16 NOTE — ED Notes (Signed)
She woke this am with a swollen painful area to LFA. Denies injuries. No redness noted.

## 2014-02-17 DIAGNOSIS — M729 Fibroblastic disorder, unspecified: Secondary | ICD-10-CM

## 2014-02-17 DIAGNOSIS — L089 Local infection of the skin and subcutaneous tissue, unspecified: Secondary | ICD-10-CM

## 2014-02-17 LAB — BASIC METABOLIC PANEL
Anion gap: 11 (ref 5–15)
BUN: 5 mg/dL — ABNORMAL LOW (ref 6–23)
CO2: 26 mEq/L (ref 19–32)
Calcium: 8.7 mg/dL (ref 8.4–10.5)
Chloride: 103 mEq/L (ref 96–112)
Creatinine, Ser: 0.75 mg/dL (ref 0.50–1.10)
GFR calc Af Amer: >90 mL/min (ref >90)
GFR calc non Af Amer: >90 mL/min (ref >90)
Glucose, Bld: 94 mg/dL (ref 70–99)
Potassium: 4.7 mEq/L (ref 3.7–5.3)
Sodium: 140 mEq/L (ref 137–147)

## 2014-02-17 LAB — CBC
HCT: 41.5 % (ref 36.0–46.0)
Hemoglobin: 13.4 g/dL (ref 12.0–15.0)
MCH: 30.4 pg (ref 26.0–34.0)
MCHC: 32.3 g/dL (ref 30.0–36.0)
MCV: 94.1 fL (ref 78.0–100.0)
Platelets: 199 10*3/uL (ref 150–400)
RBC: 4.41 MIL/uL (ref 3.87–5.11)
RDW: 15 % (ref 11.5–15.5)
WBC: 7.4 10*3/uL (ref 4.0–10.5)

## 2014-02-17 LAB — URIC ACID: Uric Acid, Serum: 3.9 mg/dL (ref 2.4–7.0)

## 2014-02-17 LAB — CK: Total CK: 109 U/L (ref 7–177)

## 2014-02-17 MED ORDER — VANCOMYCIN HCL 10 G IV SOLR
1500.0000 mg | Freq: Once | INTRAVENOUS | Status: AC
  Administered 2014-02-17: 1500 mg via INTRAVENOUS
  Filled 2014-02-17: qty 1500

## 2014-02-17 MED ORDER — SODIUM CHLORIDE 0.9 % IV SOLN: 250.0000 mL | INTRAVENOUS | Status: DC | PRN

## 2014-02-17 MED ORDER — VANCOMYCIN HCL IN DEXTROSE 1-5 GM/200ML-% IV SOLN
1000.0000 mg | Freq: Two times a day (BID) | INTRAVENOUS | Status: DC
  Administered 2014-02-18 – 2014-02-19 (×5): 1000 mg via INTRAVENOUS
  Filled 2014-02-17 (×6): qty 200

## 2014-02-17 MED ORDER — HYDROCODONE-ACETAMINOPHEN 5-325 MG PO TABS
1.0000 | ORAL_TABLET | Freq: Four times a day (QID) | ORAL | Status: DC
  Administered 2014-02-17 – 2014-02-20 (×11): 1 via ORAL
  Filled 2014-02-17 (×11): qty 1

## 2014-02-17 NOTE — Progress Notes (Signed)
Patient with 10/10 pain -DR Thedore MinsSingh called and Morphine dose given now.

## 2014-02-17 NOTE — Consult Note (Signed)
Reason for Consult: Followup Referring Physician: Hospitalist [name] is an 33 y.o. female.  HPI: Patient is seen for followup today. She states her form is much better she still complains of hand pain. She notes a very the threshold to pain.  The a patient is certainly pain focused and given her anxiety and bipolar issues I think this heightens her poor coping mechanisms.  She is eating. She is voiding. She denies history of gout. The patient certainly is improved overall with a decreasing my blood cell count is still complains of pain. Past Medical History  Diagnosis Date  . Anxiety   . Bipolar 1 disorder     Past Surgical History  Procedure Laterality Date  . Tubal ligation      History reviewed. No pertinent family history.  Social History:  reports that she has never smoked. She does not have any smokeless tobacco history on file. She reports that she does not drink alcohol or use illicit drugs.  Allergies:  Allergies  Allergen Reactions  . Latex Itching    gloves    Medications: I have reviewed the patient's current medications.  Results for orders placed during the hospital encounter of 02/16/14 (from the past 48 hour(s))  CBC WITH DIFFERENTIAL     Status: Abnormal   Collection Time    02/16/14 11:00 AM      Result Value Ref Range   WBC 10.1  4.0 - 10.5 K/uL   RBC 4.99  3.87 - 5.11 MIL/uL   Hemoglobin 15.5 (*) 12.0 - 15.0 g/dL   HCT 47.9 (*) 98.7 - 21.5 %   MCV 94.2  78.0 - 100.0 fL   MCH 31.1  26.0 - 34.0 pg   MCHC 33.0  30.0 - 36.0 g/dL   RDW 87.2  76.1 - 84.8 %   Platelets 246  150 - 400 K/uL   Neutrophils Relative % 72  43 - 77 %   Neutro Abs 7.3  1.7 - 7.7 K/uL   Lymphocytes Relative 17  12 - 46 %   Lymphs Abs 1.8  0.7 - 4.0 K/uL   Monocytes Relative 11  3 - 12 %   Monocytes Absolute 1.1 (*) 0.1 - 1.0 K/uL   Eosinophils Relative 0  0 - 5 %   Eosinophils Absolute 0.0  0.0 - 0.7 K/uL   Basophils Relative 0  0 - 1 %   Basophils Absolute 0.0   0.0 - 0.1 K/uL  BASIC METABOLIC PANEL     Status: Abnormal   Collection Time    02/16/14 11:00 AM      Result Value Ref Range   Sodium 142  137 - 147 mEq/L   Potassium 4.5  3.7 - 5.3 mEq/L   Chloride 103  96 - 112 mEq/L   CO2 24  19 - 32 mEq/L   Glucose, Bld 98  70 - 99 mg/dL   BUN <3 (*) 6 - 23 mg/dL   Creatinine, Ser 5.92  0.50 - 1.10 mg/dL   Calcium 9.2  8.4 - 76.3 mg/dL   GFR calc non Af Amer >90  >90 mL/min   GFR calc Af Amer >90  >90 mL/min   Comment: (NOTE)     The eGFR has been calculated using the CKD EPI equation.     This calculation has not been validated in all clinical situations.     eGFR's persistently <90 mL/min signify possible Chronic Kidney     Disease.   Anion  gap 15  5 - 15  C-REACTIVE PROTEIN     Status: Abnormal   Collection Time    02/16/14 11:25 AM      Result Value Ref Range   CRP 1.3 (*) <0.60 mg/dL   Comment: Performed at [facility]     Status: None   Collection Time    02/16/14 11:25 AM      Result Value Ref Range   Sed Rate 7  0 - 22 mm/hr  BASIC METABOLIC PANEL     Status: Abnormal   Collection Time    02/17/14  6:35 AM      Result Value Ref Range   Sodium 140  137 - 147 mEq/L   Potassium 4.7  3.7 - 5.3 mEq/L   Chloride 103  96 - 112 mEq/L   CO2 26  19 - 32 mEq/L   Glucose, Bld 94  70 - 99 mg/dL   BUN 5 (*) 6 - 23 mg/dL   Creatinine, Ser 0.75  0.50 - 1.10 mg/dL   Calcium 8.7  8.4 - 10.5 mg/dL   GFR calc non Af Amer >90  >90 mL/min   GFR calc Af Amer >90  >90 mL/min   Comment: (NOTE)     The eGFR has been calculated using the CKD EPI equation.     This calculation has not been validated in all clinical situations.     eGFR's persistently <90 mL/min signify possible Chronic Kidney     Disease.   Anion gap 11  5 - 15  CBC     Status: None   Collection Time    02/17/14  6:35 AM      Result Value Ref Range   WBC 7.4  4.0 - 10.5 K/uL   RBC 4.41  3.87 - 5.11 MIL/uL   Hemoglobin 13.4  12.0 - 15.0 g/dL    HCT 41.5  36.0 - 46.0 %   MCV 94.1  78.0 - 100.0 fL   MCH 30.4  26.0 - 34.0 pg   MCHC 32.3  30.0 - 36.0 g/dL   RDW 15.0  11.5 - 15.5 %   Platelets 199  150 - 400 K/uL    Mr Forearm Left W/cm  02/16/2014   CLINICAL DATA:  Erythema, pain, and swelling of the left forearm.  EXAM: MRI OF THE LEFT FOREARM WITH CONTRAST  TECHNIQUE: Multiplanar, multisequence MR imaging was performed following the administration of intravenous contrast. A custom protocol to survey the distal forearm and proximal hand was utilized, but orthopedic wrist or hand protocol was not included.  CONTRAST:  80m MULTIHANCE GADOBENATE DIMEGLUMINE 529 MG/ML IV SOLN  COMPARISON:  09/14/2003 radiographs  FINDINGS: Abnormal edema spans from the proximal -medial forearm and into the hand and fingers within the subcutaneous tissues, with associated abnormal enhancement favoring cellulitis. There is also abnormal edema and enhancement tracking in the extensor forearm musculature, especially the extensor digitorum but also within along the remaining extensor muscles. No drainable abscess is directly observed. Extensor peritendinitis is confluent with the subcutaneous edema and enhancement.  No flexor compartment edema or enhancement noted. No significant abnormal osseous edema.  IMPRESSION: 1. Cellulitis and extensor compartment myositis/fasciitis in the forearm, with the cellulitis component extending into the dorsal hand. No current drainable abscess.   Electronically Signed   By: WSherryl BartersM.D.   On: 02/16/2014 15:06    ROS Blood pressure 127/73, pulse 64, temperature 97.9 F (36.6 C), temperature source Oral,  resp. rate 16, height _0  (1.727 m), weight 74.844 kg (165 lb), SpO2 100.00%. Physical Exam Left upper extremity is improved has less cellulitis and edema she maintains passive range of motion and 50% her lack of motion to the fingers. There is no evidence of obvious sepsis in the wrist. There's no evidence of compartment  syndrome.  Patient I reviewed these issues at length and findings  At present juncture I recommend continuing antibiotics and observation     Assessment/Plan: We will continue to watch with you I do not recommend any changes in her algorithm. Elevation range of motion antibiotics and observation will be to rule. I've tried to discuss her how important it is to elevate and move. The pump in action of the fingers ice he decreases the edema markedly Kyrian Stage III,Lavell Supple M 02/17/2014, 1:06 PM

## 2014-02-17 NOTE — Progress Notes (Signed)
Patient Demographics  [name], is a 33 y.o. female, DOB - October 02, 1980, ZOX:[id number]  Admit date - 02/16/2014   Admitting Physician Penny Pia, MD  Outpatient Primary MD for the patient is Default, Provider, MD  LOS - 1   Chief Complaint  Patient presents with  . Arm Pain           Subjective:   [name] today has, No headache, No chest pain, No abdominal pain - No Nausea, No new weakness tingling or numbness, No Cough - SOB. Improved but still has left arm pain and discomfort    Assessment & Plan    1. L Arm Myositis with edema and possible cellulitis. Etiology unclear, she did do heavy lifting while moving multiple boxes out of her car the day this happened, question if this is left arm myositis due to overexertion. Hand surgery following. Blood cultures pending. Element of cellulitis clinically appears to be minimal, will place on vancomycin and follow cultures. No fever or leukocytosis. Exam stable. Currently no signs of compartment syndrome. Continue left arm elevation and monitor clinically with supportive care.    2.  Anxiety and depression - no acute issues not suicidal or homicidal, continue Abilify and Effexor.     Code Status: Full  Family Communication: Husband  Disposition Plan: Home   Procedures MR L Arm   Consults  Hand Surgery   Medications  Scheduled Meds: . ARIPiprazole  2 mg Oral Daily  . gabapentin  800 mg Oral TID  . heparin  5,000 Units Subcutaneous 3 times per day  . HYDROcodone-acetaminophen  1 tablet Oral Q6H  . meloxicam  15 mg Oral Daily  . sodium chloride  3 mL Intravenous Q12H  . vancomycin  1,500 mg Intravenous Once  . [START ON 02/18/2014] vancomycin  1,000 mg Intravenous Q12H  . venlafaxine XR  150 mg Oral Q breakfast  .  venlafaxine XR  75 mg Oral Q breakfast   Continuous Infusions:  PRN Meds:.sodium chloride, morphine injection, ondansetron (ZOFRAN) IV, ondansetron, oxyCODONE, sodium chloride  DVT Prophylaxis    Heparin   Lab Results  Component Value Date   PLT 199 02/17/2014    Antibiotics    Anti-infectives   Start     Dose/Rate Route Frequency Ordered Stop   02/18/14 0000  vancomycin (VANCOCIN) IVPB 1000 mg/200 mL premix     1,000 mg 200 mL/hr over 60 Minutes Intravenous Every 12 hours 02/17/14 1217     02/17/14 1330  vancomycin (VANCOCIN) 1,500 mg in sodium chloride 0.9 % 500 mL IVPB     1,500 mg 250 mL/hr over 120 Minutes Intravenous  Once 02/17/14 1217     02/17/14 0100  ceFAZolin (ANCEF) IVPB 2 g/50 mL premix  Status:  Discontinued     2 g 100 mL/hr over 30 Minutes Intravenous 3 times per day 02/16/14 1956 02/17/14 1204   02/16/14 1700  ceFAZolin (ANCEF) IVPB 1 g/50 mL premix  Status:  Discontinued     1 g 100 mL/hr over 30 Minutes Intravenous 3 times per day 02/16/14 1650 02/16/14 1956   02/16/14 1100  clindamycin (CLEOCIN) IVPB 600 mg     600 mg 100 mL/hr over 30 Minutes Intravenous  Once 02/16/14 1047 02/16/14  1250          Objective:   Filed Vitals:   02/16/14 1748 02/16/14 2026 02/17/14 0119 02/17/14 0634  BP: 129/75 149/122 105/66 127/73  Pulse: 100 93  64  Temp: 97.9 F (36.6 C) 98.5 F (36.9 C)  97.9 F (36.6 C)  TempSrc: Oral Oral  Oral  Resp: 16 16  16   Height:      Weight:      SpO2: 100% 98%  100%    Wt Readings from Last 3 Encounters:  02/16/14 74.844 kg (165 lb)  07/10/13 90.719 kg (200 lb)  05/26/13 89.812 kg (198 lb)     Intake/Output Summary (Last 24 hours) at 02/17/14 1306 Last data filed at 02/17/14 0900  Gross per 24 hour  Intake    240 ml  Output    700 ml  Net   -460 ml     Physical Exam  Awake Alert, Oriented X 3, No new F.N deficits, Normal affect [place].AT,PERRAL Supple Neck,No JVD, No cervical lymphadenopathy appriciated.    Symmetrical Chest wall movement, Good air movement bilaterally, CTAB RRR,No Gallops,Rubs or new Murmurs, No Parasternal Heave +ve B.Sounds, Abd Soft, No tenderness, No organomegaly appriciated, No rebound - guarding or rigidity. No Cyanosis, Clubbing or edema, No new Rash or bruise left forearm upto the left hand is slightly swollen. No warmth or erythema.   Data Review   Micro Results No results found for this or any previous visit (from the past 240 hour(s)).  Radiology Reports Mr Forearm Left W/cm  02/16/2014   CLINICAL DATA:  Erythema, pain, and swelling of the left forearm.  EXAM: MRI OF THE LEFT FOREARM WITH CONTRAST  TECHNIQUE: Multiplanar, multisequence MR imaging was performed following the administration of intravenous contrast. A custom protocol to survey the distal forearm and proximal hand was utilized, but orthopedic wrist or hand protocol was not included.  CONTRAST:  15mL MULTIHANCE GADOBENATE DIMEGLUMINE 529 MG/ML IV SOLN  COMPARISON:  09/14/2003 radiographs  FINDINGS: Abnormal edema spans from the proximal -medial forearm and into the hand and fingers within the subcutaneous tissues, with associated abnormal enhancement favoring cellulitis. There is also abnormal edema and enhancement tracking in the extensor forearm musculature, especially the extensor digitorum but also within along the remaining extensor muscles. No drainable abscess is directly observed. Extensor peritendinitis is confluent with the subcutaneous edema and enhancement.  No flexor compartment edema or enhancement noted. No significant abnormal osseous edema.  IMPRESSION: 1. Cellulitis and extensor compartment myositis/fasciitis in the forearm, with the cellulitis component extending into the dorsal hand. No current drainable abscess.   Electronically Signed   By: Herbie BaltimoreWalt  Liebkemann M.D.   On: 02/16/2014 15:06    CBC  Recent Labs Lab 02/16/14 1100 02/17/14 0635  WBC 10.1 7.4  HGB 15.5* 13.4  HCT 47.0* 41.5   PLT 246 199  MCV 94.2 94.1  MCH 31.1 30.4  MCHC 33.0 32.3  RDW 14.6 15.0  LYMPHSABS 1.8  --   MONOABS 1.1*  --   EOSABS 0.0  --   BASOSABS 0.0  --     Chemistries   Recent Labs Lab 02/16/14 1100 02/17/14 0635  NA 142 140  K 4.5 4.7  CL 103 103  CO2 24 26  GLUCOSE 98 94  BUN <3* 5*  CREATININE 0.73 0.75  CALCIUM 9.2 8.7   ------------------------------------------------------------------------------------------------------------------ estimated creatinine clearance is 100 ml/min (by C-G formula based on Cr of 0.75). ------------------------------------------------------------------------------------------------------------------ No results found for this basename: HGBA1C,  in the last 72 hours ------------------------------------------------------------------------------------------------------------------ No results found for this basename: CHOL, HDL, LDLCALC, TRIG, CHOLHDL, LDLDIRECT,  in the last 72 hours ------------------------------------------------------------------------------------------------------------------ No results found for this basename: TSH, T4TOTAL, FREET3, T3FREE, THYROIDAB,  in the last 72 hours ------------------------------------------------------------------------------------------------------------------ No results found for this basename: VITAMINB12, FOLATE, FERRITIN, TIBC, IRON, RETICCTPCT,  in the last 72 hours  Coagulation profile No results found for this basename: INR, PROTIME,  in the last 168 hours  No results found for this basename: DDIMER,  in the last 72 hours  Cardiac Enzymes No results found for this basename: CK, CKMB, TROPONINI, MYOGLOBIN,  in the last 168 hours ------------------------------------------------------------------------------------------------------------------ No components found with this basename: POCBNP,      Time Spent in minutes   35   SINGH,PRASHANT K M.D on 02/17/2014 at 1:06 PM  Between 7am to 7pm  - Pager - [phone]  After 7pm go to [email or web address] - password [email or web address]  And look for the night coverage person covering for me after hours  [facility] Office  [phone]   **Disclaimer: This note may have been dictated with voice recognition software. Similar sounding words can inadvertently be transcribed and this note may contain transcription errors which may not have been corrected upon publication of note.**

## 2014-02-17 NOTE — Consult Note (Signed)
ANTIBIOTIC CONSULT NOTE  Pharmacy Consult for Cefazolin, vancomycin Indication: cellulitis  Allergies  Allergen Reactions  . Latex Itching    gloves    Patient Measurements: Height: 5\' 8"  (172.7 cm) Weight: 165 lb (74.844 kg) IBW/kg (Calculated) : 63.9  Vital Signs: Temp: 97.9 F (36.6 C) (07/15 0634) Temp src: Oral (07/15 0634) BP: 127/73 mmHg (07/15 0634) Pulse Rate: 64 (07/15 0634) Intake/Output from previous day:   Intake/Output from this shift: Total I/O In: 240 [P.O.:240] Out: 700 [Urine:700]  Labs:  Recent Labs  02/16/14 1100 02/17/14 0635  WBC 10.1 7.4  HGB 15.5* 13.4  PLT 246 199  CREATININE 0.73 0.75   Estimated Creatinine Clearance: 100 ml/min (by C-G formula based on Cr of 0.75).  Microbiology: No results found for this or any previous visit (from the past 720 hour(s)).  Medical History: Past Medical History  Diagnosis Date  . Anxiety   . Bipolar 1 disorder    Assessment: 33yof on cefazolin for left hand cellulitis. ID consult pending.  Pharmacy consulted to add vancomycin to cefazolin. WBC wnl. Renal function stable. Wt 74.8 kg.   Clinda 600 x1 7/14 in ED Cefazolin 7/14>> Vancomycin 7/15>>.  7/14 BCx2 IP   Goal of Therapy:  Vancomycin trough 10-15 mcg/ml  Plan:  -vancomycin 1500 mg loading dose followed by vancomycin 1000 mg IV q12h -continue Cefazolin 1g IV q8 - Follow renal function, cultures, LOT, ID recs -steady-state vanc trough if vanc to continue > 5 days Herby AbrahamMichelle T. Chasitty Hehl, Pharm.D. [phone] 02/17/2014 12:21 PM

## 2014-02-17 NOTE — Consult Note (Signed)
[facility]    Date of Admission:  02/16/2014   Total days of antibiotics 2               Reason for Consult: Soft tissue infection of left arm    Referring Physician: Dr. Penny Pia  Active Problems:   Cellulitis   Myositis   Anxiety and depression   . ARIPiprazole  2 mg Oral Daily  . gabapentin  800 mg Oral TID  . heparin  5,000 Units Subcutaneous 3 times per day  . HYDROcodone-acetaminophen  1 tablet Oral Q6H  . meloxicam  15 mg Oral Daily  . sodium chloride  3 mL Intravenous Q12H  . vancomycin  1,500 mg Intravenous Once  . [START ON 02/18/2014] vancomycin  1,000 mg Intravenous Q12H  . venlafaxine XR  150 mg Oral Q breakfast  . venlafaxine XR  75 mg Oral Q breakfast    Recommendations: 1. Agree with continuing vancomycin alone   Assessment: [name] has spontaneous soft tissue infection of her left hand and arm. Although her scan was read as showing myositis and fasciitis I do not see any indication for surgical intervention. She is improving fairly rapidly with empiric antibiotic therapy. I will continue vancomycin alone to cover staph and strep, most likely culprits. Once her pain is under better control I would consider switching her to oral cephalexin and doxycycline.   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 She does not recall any injury to her forearm. She denies any use of injecting drugs. She has no history of boils, abscesses or MRSA exposure. The pain and swelling increased over the next 24 hours leading to her admission to the hospital yesterday. She has been afebrile with a normal white blood cell count. MRI of her left forearm showed evidence of cellulitis, myositis or fasciitis without obvious abscess. There are no foreign bodies. She has improved with empiric antibiotic therapy. She was given a dose of clindamycin in the emergency  department then switched to cefazolin and is now on vancomycin. Blood cultures are negative to date.   Review of Systems: Constitutional: negative for anorexia, chills, fevers and sweats Eyes: negative Ears, nose, mouth, throat, and face: negative Respiratory: negative Cardiovascular: negative Gastrointestinal: negative Genitourinary:negative  Past Medical History  Diagnosis Date  . Anxiety   . Bipolar 1 disorder     History  Substance Use Topics  . Smoking status: Never Smoker   . Smokeless tobacco: Not on file  . Alcohol Use: No    History reviewed. No pertinent family history. Allergies  Allergen Reactions  . Latex Itching    gloves    OBJECTIVE: Blood pressure 122/58, pulse 64, temperature 97.9 F (36.6 C), temperature source Oral, resp. rate 16, height 5\' 8"  (1.727 m), weight 165 lb (74.844 kg), SpO2 95.00%. General: She is alert and in no particular distress watching television Skin: No rash Lymph nodes: No palpable adenopathy Oral: No oropharyngeal lesions Lungs: Clear Cor: Regular S1 and S2 no murmurs Abdomen: Soft and nontender Left arm: She has diffuse swelling over the dorsum of her left hand. She states that the swelling of her forearm has improved overnight. She has mild to moderate tenderness with palpation and range of motion. There is minimal erythema and warmth.  Lab Results Lab Results  Component Value Date  WBC 7.4 02/17/2014   HGB 13.4 02/17/2014   HCT 41.5 02/17/2014   MCV 94.1 02/17/2014   PLT 199 02/17/2014    Lab Results  Component Value Date   CREATININE 0.75 02/17/2014   BUN 5* 02/17/2014   NA 140 02/17/2014   K 4.7 02/17/2014   CL 103 02/17/2014   CO2 26 02/17/2014    Lab Results  Component Value Date   ALT 29 07/10/2013   AST 23 07/10/2013   ALKPHOS 93 07/10/2013   BILITOT 0.2* 07/10/2013     Microbiology: No results found for this or any previous visit (from the past 240 hour(s)).  Cliffton AstersJohn Euphemia Lingerfelt, MD [facility] [phone] pager   [phone] cell 02/17/2014, 2:51 PM

## 2014-02-18 DIAGNOSIS — L03019 Cellulitis of unspecified finger: Secondary | ICD-10-CM

## 2014-02-18 DIAGNOSIS — L02519 Cutaneous abscess of unspecified hand: Secondary | ICD-10-CM

## 2014-02-18 LAB — BASIC METABOLIC PANEL
Anion gap: 12 (ref 5–15)
BUN: 4 mg/dL — ABNORMAL LOW (ref 6–23)
CO2: 26 mEq/L (ref 19–32)
Calcium: 8.6 mg/dL (ref 8.4–10.5)
Chloride: 105 mEq/L (ref 96–112)
Creatinine, Ser: 0.75 mg/dL (ref 0.50–1.10)
GFR calc Af Amer: >90 mL/min (ref >90)
GFR calc non Af Amer: >90 mL/min (ref >90)
Glucose, Bld: 111 mg/dL — ABNORMAL HIGH (ref 70–99)
Potassium: 4 mEq/L (ref 3.7–5.3)
Sodium: 143 mEq/L (ref 137–147)

## 2014-02-18 LAB — CBC
HCT: 40.2 % (ref 36.0–46.0)
Hemoglobin: 12.9 g/dL (ref 12.0–15.0)
MCH: 30.1 pg (ref 26.0–34.0)
MCHC: 32.1 g/dL (ref 30.0–36.0)
MCV: 93.9 fL (ref 78.0–100.0)
Platelets: 177 10*3/uL (ref 150–400)
RBC: 4.28 MIL/uL (ref 3.87–5.11)
RDW: 14.8 % (ref 11.5–15.5)
WBC: 6.4 10*3/uL (ref 4.0–10.5)

## 2014-02-18 LAB — CK: Total CK: 66 U/L (ref 7–177)

## 2014-02-18 MED ORDER — MORPHINE SULFATE 2 MG/ML IJ SOLN
2.0000 mg | INTRAMUSCULAR | Status: DC | PRN
  Administered 2014-02-18 – 2014-02-19 (×12): 2 mg via INTRAVENOUS
  Filled 2014-02-18 (×12): qty 1

## 2014-02-18 NOTE — Progress Notes (Signed)
Patient ID: [name], female   DOB: Oct 05, 1980, 34 y.o.   MRN: [id number]         [facility]    Date of Admission:  02/16/2014           Day 3 vancomycin Active Problems:   Cellulitis   Myositis   Anxiety and depression   . ARIPiprazole  2 mg Oral Daily  . gabapentin  800 mg Oral TID  . heparin  5,000 Units Subcutaneous 3 times per day  . HYDROcodone-acetaminophen  1 tablet Oral Q6H  . meloxicam  15 mg Oral Daily  . sodium chloride  3 mL Intravenous Q12H  . vancomycin  1,000 mg Intravenous Q12H  . venlafaxine XR  150 mg Oral Q breakfast  . venlafaxine XR  75 mg Oral Q breakfast    Subjective: She is complaining of increased pain today in her left lateral hand. The pain in her forearm and around her left thumb and index finger have improved. She has been up walking in the hall and showered today.  Objective: Temp:  [97.5 F (36.4 C)-98.3 F (36.8 C)] 98 F (36.7 C) (07/16 1302) Pulse Rate:  [52-69] 69 (07/16 1302) Resp:  [16] 16 (07/16 0614) BP: (107-124)/(56-65) 118/63 mmHg (07/16 1302) SpO2:  [96 %-100 %] 98 % (07/16 1302)  General: She is tearful due to pain Skin: No rash Left arm: Her forearm swelling and cellulitis have resolved. She still has some diffuse swelling of the dorsum of her hand with minimal erythema. There is no fluctuance  Lab Results Lab Results  Component Value Date   WBC 6.4 02/18/2014   HGB 12.9 02/18/2014   HCT 40.2 02/18/2014   MCV 93.9 02/18/2014   PLT 177 02/18/2014    Lab Results  Component Value Date   CREATININE 0.75 02/18/2014   BUN 4* 02/18/2014   NA 143 02/18/2014   K 4.0 02/18/2014   CL 105 02/18/2014   CO2 26 02/18/2014     Microbiology: Recent Results (from the past 240 hour(s))  CULTURE, BLOOD (ROUTINE X 2)     Status: None   Collection Time    02/16/14  7:25 PM      Result Value Ref Range Status   Specimen Description BLOOD RIGHT ARM   Final   Special Requests BOTTLES DRAWN AEROBIC ONLY  10 CC   Final   Culture  Setup Time     Final   Value: 02/17/2014 01:07     Performed at [facility]   Culture     Final   Value:        BLOOD CULTURE RECEIVED NO GROWTH TO DATE CULTURE WILL BE HELD FOR 5 DAYS BEFORE ISSUING A FINAL NEGATIVE REPORT     Performed at [facility]   Report Status PENDING   Incomplete  CULTURE, BLOOD (ROUTINE X 2)     Status: None   Collection Time    02/16/14  7:35 PM      Result Value Ref Range Status   Specimen Description BLOOD RIGHT ARM   Final   Special Requests BOTTLES DRAWN AEROBIC ONLY 1 CC   Final   Culture  Setup Time     Final   Value: 02/17/2014 01:07     Performed at [facility]   Culture     Final   Value:        BLOOD CULTURE RECEIVED NO GROWTH TO DATE CULTURE WILL  BE HELD FOR 5 DAYS BEFORE ISSUING A FINAL NEGATIVE REPORT     [facility]   Report Status PENDING   Incomplete   Assessment: Overall she seems to be improving on therapy for cellulitis and possible mild myositis. Her increased pain may be related to increased activity today.  Plan: 1. Continue vancomycin  Cliffton AstersJohn Dashiel Bergquist, MD [facility] [phone] pager   [phone] cell 02/18/2014, 4:04 PM

## 2014-02-18 NOTE — Progress Notes (Signed)
Patient Demographics  [name], is a 33 y.o. female, DOB - 1980/09/11, ZOX:[id number]  Admit date - 02/16/2014   Admitting Physician Penny Pia, MD  Outpatient Primary MD for the patient is Default, Provider, MD  LOS - 2   Chief Complaint  Patient presents with  . Arm Pain        Subjective:   [facility] today has, No headache, No chest pain, No abdominal pain - No Nausea, No new weakness tingling or numbness, No Cough - SOB. Improved but still has left arm pain and discomfort.   Assessment & Plan     1. L Arm Myositis with edema and possible cellulitis. Etiology unclear, she did do heavy lifting while moving multiple boxes out of her car the day this happened, question if this is left arm myositis due to overexertion, CK stable. ID and Hand surgery following. Blood cultures negative so far. Element of cellulitis clinically appears to be minimal, will place on vancomycin and follow cultures. No fever or leukocytosis. Exam stable. Currently no signs of compartment syndrome.   Continue left arm elevation and monitor clinically with supportive care.     2.  Anxiety and depression - no acute issues not suicidal or homicidal, continue Abilify and Effexor.      Code Status: Full  Family Communication: Husband  Disposition Plan: Home   Procedures MR L Arm   Consults  Hand Surgery   Medications  Scheduled Meds: . ARIPiprazole  2 mg Oral Daily  . gabapentin  800 mg Oral TID  . heparin  5,000 Units Subcutaneous 3 times per day  . HYDROcodone-acetaminophen  1 tablet Oral Q6H  . meloxicam  15 mg Oral Daily  . sodium chloride  3 mL Intravenous Q12H  . vancomycin  1,000 mg Intravenous Q12H  . venlafaxine XR  150 mg Oral Q breakfast  . venlafaxine XR  75 mg Oral Q  breakfast   Continuous Infusions:  PRN Meds:.sodium chloride, morphine injection, ondansetron (ZOFRAN) IV, ondansetron, oxyCODONE, sodium chloride  DVT Prophylaxis    Heparin   Lab Results  Component Value Date   PLT 177 02/18/2014    Antibiotics    Anti-infectives   Start     Dose/Rate Route Frequency Ordered Stop   02/18/14 0000  vancomycin (VANCOCIN) IVPB 1000 mg/200 mL premix     1,000 mg 200 mL/hr over 60 Minutes Intravenous Every 12 hours 02/17/14 1217     02/17/14 1330  vancomycin (VANCOCIN) 1,500 mg in sodium chloride 0.9 % 500 mL IVPB     1,500 mg 250 mL/hr over 120 Minutes Intravenous  Once 02/17/14 1217 02/17/14 1615   02/17/14 0100  ceFAZolin (ANCEF) IVPB 2 g/50 mL premix  Status:  Discontinued     2 g 100 mL/hr over 30 Minutes Intravenous 3 times per day 02/16/14 1956 02/17/14 1204   02/16/14 1700  ceFAZolin (ANCEF) IVPB 1 g/50 mL premix  Status:  Discontinued     1 g 100 mL/hr over 30 Minutes Intravenous 3 times per day 02/16/14 1650 02/16/14 1956   02/16/14 1100  clindamycin (CLEOCIN) IVPB 600 mg     600 mg 100 mL/hr over 30 Minutes Intravenous  Once 02/16/14 1047 02/16/14 1250  Objective:   Filed Vitals:   02/17/14 0634 02/17/14 1300 02/17/14 2041 02/18/14 0614  BP: 127/73 122/58 107/65 124/56  Pulse: 64 64 69 52  Temp: 97.9 F (36.6 C) 97.9 F (36.6 C) 98.3 F (36.8 C) 97.5 F (36.4 C)  TempSrc: Oral Oral Oral Oral  Resp: 16 16 16 16   Height:      Weight:      SpO2: 100% 95% 96% 100%    Wt Readings from Last 3 Encounters:  02/16/14 74.844 kg (165 lb)  07/10/13 90.719 kg (200 lb)  05/26/13 89.812 kg (198 lb)     Intake/Output Summary (Last 24 hours) at 02/18/14 1017 Last data filed at 02/18/14 0945  Gross per 24 hour  Intake    720 ml  Output      0 ml  Net    720 ml     Physical Exam  Awake Alert, Oriented X 3, No new F.N deficits, Normal affect Faith.AT,PERRAL Supple Neck,No JVD, No cervical lymphadenopathy appriciated.    Symmetrical Chest wall movement, Good air movement bilaterally, CTAB RRR,No Gallops,Rubs or new Murmurs, No Parasternal Heave +ve B.Sounds, Abd Soft, No tenderness, No organomegaly appriciated, No rebound - guarding or rigidity. No Cyanosis, Clubbing or edema, No new Rash or bruise left forearm upto the left hand is slightly swollen. No warmth or erythema.    Data Review   Micro Results Recent Results (from the past 240 hour(s))  CULTURE, BLOOD (ROUTINE X 2)     Status: None   Collection Time    02/16/14  7:25 PM      Result Value Ref Range Status   Specimen Description BLOOD RIGHT ARM   Final   Special Requests BOTTLES DRAWN AEROBIC ONLY 10 CC   Final   Culture  Setup Time     Final   Value: 02/17/2014 01:07     [facility]   Culture     Final   Value:        BLOOD CULTURE RECEIVED NO GROWTH TO DATE CULTURE WILL BE HELD FOR 5 DAYS BEFORE ISSUING A FINAL NEGATIVE REPORT     Performed at [facility]   Report Status PENDING   Incomplete  CULTURE, BLOOD (ROUTINE X 2)     Status: None   Collection Time    02/16/14  7:35 PM      Result Value Ref Range Status   Specimen Description BLOOD RIGHT ARM   Final   Special Requests BOTTLES DRAWN AEROBIC ONLY 1 CC   Final   Culture  Setup Time     Final   Value: 02/17/2014 01:07     Performed at [facility]   Culture     Final   Value:        BLOOD CULTURE RECEIVED NO GROWTH TO DATE CULTURE WILL BE HELD FOR 5 DAYS BEFORE ISSUING A FINAL NEGATIVE REPORT     Performed at [facility]   Report Status PENDING   Incomplete    Radiology Reports Mr Forearm Left W/cm  02/16/2014   CLINICAL DATA:  Erythema, pain, and swelling of the left forearm.  EXAM: MRI OF THE LEFT FOREARM WITH CONTRAST  TECHNIQUE: Multiplanar, multisequence MR imaging was performed following the administration of intravenous contrast. A custom protocol to survey the distal forearm and proximal hand was utilized, but orthopedic  wrist or hand protocol was not included.  CONTRAST:  15mL MULTIHANCE GADOBENATE DIMEGLUMINE 529 MG/ML IV  SOLN  COMPARISON:  09/14/2003 radiographs  FINDINGS: Abnormal edema spans from the proximal -medial forearm and into the hand and fingers within the subcutaneous tissues, with associated abnormal enhancement favoring cellulitis. There is also abnormal edema and enhancement tracking in the extensor forearm musculature, especially the extensor digitorum but also within along the remaining extensor muscles. No drainable abscess is directly observed. Extensor peritendinitis is confluent with the subcutaneous edema and enhancement.  No flexor compartment edema or enhancement noted. No significant abnormal osseous edema.  IMPRESSION: 1. Cellulitis and extensor compartment myositis/fasciitis in the forearm, with the cellulitis component extending into the dorsal hand. No current drainable abscess.   Electronically Signed   By: Herbie Baltimore M.D.   On: 02/16/2014 15:06     CBC  Recent Labs Lab 02/16/14 1100 02/17/14 0635 02/18/14 0643  WBC 10.1 7.4 6.4  HGB 15.5* 13.4 12.9  HCT 47.0* 41.5 40.2  PLT 246 199 177  MCV 94.2 94.1 93.9  MCH 31.1 30.4 30.1  MCHC 33.0 32.3 32.1  RDW 14.6 15.0 14.8  LYMPHSABS 1.8  --   --   MONOABS 1.1*  --   --   EOSABS 0.0  --   --   BASOSABS 0.0  --   --     Chemistries   Recent Labs Lab 02/16/14 1100 02/17/14 0635 02/18/14 0643  NA 142 140 143  K 4.5 4.7 4.0  CL 103 103 105  CO2 24 26 26   GLUCOSE 98 94 111*  BUN <3* 5* 4*  CREATININE 0.73 0.75 0.75  CALCIUM 9.2 8.7 8.6   ------------------------------------------------------------------------------------------------------------------ estimated creatinine clearance is 100 ml/min (by C-G formula based on Cr of 0.75). ------------------------------------------------------------------------------------------------------------------ No results found for this basename: HGBA1C,  in the last 72  hours ------------------------------------------------------------------------------------------------------------------ No results found for this basename: CHOL, HDL, LDLCALC, TRIG, CHOLHDL, LDLDIRECT,  in the last 72 hours ------------------------------------------------------------------------------------------------------------------ No results found for this basename: TSH, T4TOTAL, FREET3, T3FREE, THYROIDAB,  in the last 72 hours ------------------------------------------------------------------------------------------------------------------ No results found for this basename: VITAMINB12, FOLATE, FERRITIN, TIBC, IRON, RETICCTPCT,  in the last 72 hours  Coagulation profile No results found for this basename: INR, PROTIME,  in the last 168 hours  No results found for this basename: DDIMER,  in the last 72 hours  Cardiac Enzymes No results found for this basename: CK, CKMB, TROPONINI, MYOGLOBIN,  in the last 168 hours ------------------------------------------------------------------------------------------------------------------ No components found with this basename: POCBNP,      Time Spent in minutes   35   Marchetta Navratil K M.D on 02/18/2014 at 10:17 AM  Between 7am to 7pm - Pager - [phone]  After 7pm go to [email or web address] - password [email or web address]  And look for the night coverage person covering for me after hours  [facility] Office  [phone]   **Disclaimer: This note may have been dictated with voice recognition software. Similar sounding words can inadvertently be transcribed and this note may contain transcription errors which may not have been corrected upon publication of note.**

## 2014-02-18 NOTE — Progress Notes (Signed)
Subjective: Patient is resting comfortably. She states that her forearm pain is markedly improved as well as the swelling. Her main complaint at this juncture is dorsal hand pain, dorsal ulnar in nature. She denies any fevers, chills, shortness of breath, diaphoresis or nausea or vomiting. She is currently receiving vancomycin and has been seen and evaluated by infectious disease.   Objective: Vital signs in last 24 hours: Temp:  [97.5 F (36.4 C)-98.3 F (36.8 C)] 98 F (36.7 C) (07/16 1302) Pulse Rate:  [52-69] 69 (07/16 1302) Resp:  [16] 16 (07/16 0614) BP: (107-124)/(56-65) 118/63 mmHg (07/16 1302) SpO2:  [96 %-100 %] 98 % (07/16 1302)  Intake/Output from previous day: 07/15 0701 - 07/16 0700 In: 720 [P.O.:720] Out: -  Intake/Output this shift: Total I/O In: 240 [P.O.:240] Out: -    Recent Labs  02/16/14 1100 02/17/14 0635 02/18/14 0643  HGB 15.5* 13.4 12.9    Recent Labs  02/17/14 0635 02/18/14 0643  WBC 7.4 6.4  RBC 4.41 4.28  HCT 41.5 40.2  PLT 199 177    Recent Labs  02/17/14 0635 02/18/14 0643  NA 140 143  K 4.7 4.0  CL 103 105  CO2 26 26  BUN 5* 4*  CREATININE 0.75 0.75  GLUCOSE 94 111*  CALCIUM 8.7 8.6   No results found for this basename: LABPT, INR,  in the last 72 hours  Physical examination Patient is alert and oriented, in no acute distress  Evaluation of the left upper extremity shows that she's had marked improvement about the forearm edema. She has mild dorsal edema about the hand in general and is tender to palpation there is no significant erythema no obvious ascending cellulitis at this juncture there is no declaration of abscess at the present time. Range of motion is intact but is tender with active and passive attempts, pulses are intact. Refill is intact, sensation is intact .Marland Kitchen.The patient is alert and oriented in no acute distress the patient complains of pain in the affected upper extremity.  The patient is noted to have a  normal HEENT exam.  Lung fields show equal chest expansion and no shortness of breath  abdomen exam is nontender without distention.  Lower extremity examination does not show any fracture dislocation or blood clot symptoms.  Pelvis is stable neck and back are stable and nontender   Assessment/Plan: Left forearm myositis last cellulitis improving currently on IV vancomycin without any obvious surgical lesion/abscess We'll continue to follow the patient along closely currently there is no surgical indication and she is improving with IV vancomycin. We'll continue to follow along, please contact us for any questions or concerns.  Navia Lindahl L 02/18/2014, 1:03 PM

## 2014-02-19 MED ORDER — CLOTRIMAZOLE 1 % VA CREA
1.0000 | TOPICAL_CREAM | Freq: Every day | VAGINAL | Status: DC
  Filled 2014-02-19: qty 45

## 2014-02-19 MED ORDER — OXYCODONE HCL 5 MG PO TABS
5.0000 mg | ORAL_TABLET | ORAL | Status: DC | PRN
  Administered 2014-02-19 – 2014-02-20 (×3): 5 mg via ORAL
  Filled 2014-02-19 (×3): qty 1

## 2014-02-19 MED ORDER — CLOTRIMAZOLE 2 % VA CREA
1.0000 | TOPICAL_CREAM | Freq: Every day | VAGINAL | Status: DC
  Administered 2014-02-19: 1 via VAGINAL
  Filled 2014-02-19: qty 22.2

## 2014-02-19 NOTE — Progress Notes (Signed)
Patient ID: [name], female   DOB: [birth year], 33 y.o.   MRN: [id number]         [facility]    Date of Admission:  02/16/2014           Day 4 vancomycin  Active Problems:   Cellulitis   Myositis   Anxiety and depression   . ARIPiprazole  2 mg Oral Daily  . gabapentin  800 mg Oral TID  . heparin  5,000 Units Subcutaneous 3 times per day  . HYDROcodone-acetaminophen  1 tablet Oral Q6H  . meloxicam  15 mg Oral Daily  . sodium chloride  3 mL Intravenous Q12H  . vancomycin  1,000 mg Intravenous Q12H  . venlafaxine XR  150 mg Oral Q breakfast  . venlafaxine XR  75 mg Oral Q breakfast    Subjective: She is still having some pain in the lateral side of her left hand and wrist is feeling better. She is now eager to go home.  Objective: Temp:  [97.9 F (36.6 C)-98.1 F (36.7 C)] 97.9 F (36.6 C) (07/17 1300) Pulse Rate:  [64-78] 78 (07/17 1300) Resp:  [16] 16 (07/17 1300) BP: (105-115)/(59-64) 105/59 mmHg (07/17 1300) SpO2:  [96 %-100 %] 100 % (07/17 1300)  General: She is smiling and in better spirits Left arm: Still has some swelling over the dorsum of her left hand but this has improved and the erythema over her forearm and hand have resolved. She still has moderate pain with palpation of her left hand over the fourth and fifth metacarpals and wrist but there is no fluctuance. Her pain has improved.  Lab Results Lab Results  Component Value Date   WBC 6.4 02/18/2014   HGB 12.9 02/18/2014   HCT 40.2 02/18/2014   MCV 93.9 02/18/2014   PLT 177 02/18/2014    Lab Results  Component Value Date   CREATININE 0.75 02/18/2014   BUN 4* 02/18/2014   NA 143 02/18/2014   K 4.0 02/18/2014   CL 105 02/18/2014   CO2 26 02/18/2014     Assessment: She continues to improve on empiric therapy for cellulitis.  Plan: 1. Recommend discharge to home on doxycycline 100 mg twice daily and cephalexin 500 mg 4 times daily for 6 more days 2. Please call if I can  be of further assistance  Cliffton AstersJohn Carlina Derks, MD [facility] [phone] pager   [phone] cell 02/19/2014, 2:52 PM

## 2014-02-19 NOTE — Progress Notes (Signed)
Patient ID: Amanda SpruceChristy R Kimmet, female   DOB: 10-18-1980, 33 y.o.   MRN: [id number] Patient is stable.  Hand looks good.  She still very pain focused but can make a near complete fist. I discussed her the importance of elevation range of motion etc.  I do not see any surgical needs  No complications at present time. All plans noted. She is anxious to go home.  I'll sign off please -call if any problems arise  Lallie Strahm M.D.

## 2014-02-19 NOTE — ED Provider Notes (Signed)
Medical screening examination/treatment/procedure(s) were conducted as a shared visit with non-physician practitioner(s) and myself.  I personally evaluated the patient during the encounter.   EKG Interpretation None     33yF with atraumatic L forearm/wrist/hand pain. Denies IV drug use. No recent breaks in skin. On exam pt has at least severe cellulitis. Consider abscess, fasciitis/myositis, compartment syndrome.   Extreme pain with active ROM of wrist and flex/ext of fingers. Can passively range wrist ~30-45 degrees.  God cap refill in all fingers. Plan IV abx. Hand surgery consultation.   [name], MD 02/19/14 [phone]

## 2014-02-19 NOTE — Progress Notes (Signed)
Patient Demographics  [name], is a 33 y.o. female, DOB - [birth year], ZOX:[id number]  Admit date - 02/16/2014   Admitting Physician Penny Piarlando Vega, MD  Outpatient Primary MD for the patient is Default, Provider, MD  LOS - 3   Chief Complaint  Patient presents with  . Arm Pain        [facility]ive:   [facility], No headache, No chest pain, No abdominal pain - No Nausea, No new weakness tingling or numbness, No Cough - SOB. Improved but still has left arm pain and discomfort, no fever chills.   Assessment & Plan     1. L Arm Myositis with edema and possible cellulitis. Etiology unclear, she did do heavy lifting while moving multiple boxes out of her car the day this happened, question if this is left arm myositis due to overexertion, CK stable. ID and Hand surgery following. Blood cultures negative so far. Element of cellulitis clinically appears to be minimal, will place on vancomycin and follow cultures. No fever or leukocytosis. Exam stable. Currently no signs of compartment syndrome.   Continue left arm elevation and monitor clinically with supportive care. Discussed with Dr. Orvan Falconerampbell switched to oral antibiotics combination of doxycycline and Keflex once pain is much better and discharged home.     2.  Anxiety and depression - no acute issues not suicidal or homicidal, continue Abilify and Effexor.      Code Status: Full  Family Communication: Husband  Disposition Plan: Home   Procedures MR L Arm   Consults  Hand Surgery and ID   Medications  Scheduled Meds: . ARIPiprazole  2 mg Oral Daily  . gabapentin  800 mg Oral TID  . heparin  5,000 Units Subcutaneous 3 times per day  . HYDROcodone-acetaminophen  1 tablet Oral Q6H  . meloxicam  15 mg Oral Daily    . sodium chloride  3 mL Intravenous Q12H  . vancomycin  1,000 mg Intravenous Q12H  . venlafaxine XR  150 mg Oral Q breakfast  . venlafaxine XR  75 mg Oral Q breakfast   Continuous Infusions:  PRN Meds:.morphine injection, ondansetron (ZOFRAN) IV, sodium chloride  DVT Prophylaxis    Heparin   Lab Results  Component Value Date   PLT 177 02/18/2014    Antibiotics    Anti-infectives   Start     Dose/Rate Route Frequency Ordered Stop   02/18/14 0000  vancomycin (VANCOCIN) IVPB 1000 mg/200 mL premix     1,000 mg 200 mL/hr over 60 Minutes Intravenous Every 12 hours 02/17/14 1217     02/17/14 1330  vancomycin (VANCOCIN) 1,500 mg in sodium chloride 0.9 % 500 mL IVPB     1,500 mg 250 mL/hr over 120 Minutes Intravenous  Once 02/17/14 1217 02/17/14 1615   02/17/14 0100  ceFAZolin (ANCEF) IVPB 2 g/50 mL premix  Status:  Discontinued     2 g 100 mL/hr over 30 Minutes Intravenous 3 times per day 02/16/14 1956 02/17/14 1204   02/16/14 1700  ceFAZolin (ANCEF) IVPB 1 g/50 mL premix  Status:  Discontinued     1 g 100 mL/hr over 30 Minutes Intravenous 3 times per day 02/16/14 1650 02/16/14 1956   02/16/14 1100  clindamycin (CLEOCIN) IVPB  600 mg     600 mg 100 mL/hr over 30 Minutes Intravenous  Once 02/16/14 1047 02/16/14 1250          Objective:   Filed Vitals:   02/18/14 0614 02/18/14 1302 02/18/14 2051 02/19/14 0549  BP: 124/56 118/63 113/64 115/62  Pulse: 52 69 64 66  Temp: 97.5 F (36.4 C) 98 F (36.7 C) 98.1 F (36.7 C) 98 F (36.7 C)  TempSrc: Oral  Oral Oral  Resp: 16   16  Height:      Weight:      SpO2: 100% 98% 96% 97%    Wt Readings from Last 3 Encounters:  02/16/14 74.844 kg (165 lb)  07/10/13 90.719 kg (200 lb)  05/26/13 89.812 kg (198 lb)     Intake/Output Summary (Last 24 hours) at 02/19/14 1124 Last data filed at 02/18/14 1823  Gross per 24 hour  Intake    480 ml  Output      0 ml  Net    480 ml     Physical Exam  Awake Alert, Oriented X 3, No  new F.N deficits, Normal affect, appears to be in no distress [place].AT,PERRAL Supple Neck,No JVD, No cervical lymphadenopathy appriciated.  Symmetrical Chest wall movement, Good air movement bilaterally, CTAB RRR,No Gallops,Rubs or new Murmurs, No Parasternal Heave +ve B.Sounds, Abd Soft, No tenderness, No organomegaly appriciated, No rebound - guarding or rigidity. No Cyanosis, Clubbing or edema, No new Rash or bruise left forearm upto the left hand is slightly swollen. No warmth or erythema.    Data Review   Micro Results Recent Results (from the past 240 hour(s))  CULTURE, BLOOD (ROUTINE X 2)     Status: None   Collection Time    02/16/14  7:25 PM      Result Value Ref Range Status   Specimen Description BLOOD RIGHT ARM   Final   Special Requests BOTTLES DRAWN AEROBIC ONLY 10 CC   Final   Culture  Setup Time     Final   Value: 02/17/2014 01:07     Performed at [facility]   Culture     Final   Value:        BLOOD CULTURE RECEIVED NO GROWTH TO DATE CULTURE WILL BE HELD FOR 5 DAYS BEFORE ISSUING A FINAL NEGATIVE REPORT     Performed at [facility]   Report Status PENDING   Incomplete  CULTURE, BLOOD (ROUTINE X 2)     Status: None   Collection Time    02/16/14  7:35 PM      Result Value Ref Range Status   Specimen Description BLOOD RIGHT ARM   Final   Special Requests BOTTLES DRAWN AEROBIC ONLY 1 CC   Final   Culture  Setup Time     Final   Value: 02/17/2014 01:07     Performed at [facility]   Culture     Final   Value:        BLOOD CULTURE RECEIVED NO GROWTH TO DATE CULTURE WILL BE HELD FOR 5 DAYS BEFORE ISSUING A FINAL NEGATIVE REPORT     Performed at [facility]   Report Status PENDING   Incomplete    Radiology Reports Mr Forearm Left W/cm  02/16/2014   CLINICAL DATA:  Erythema, pain, and swelling of the left forearm.  EXAM: MRI OF THE LEFT FOREARM WITH CONTRAST  TECHNIQUE: Multiplanar, multisequence MR imaging was performed following  the administration of  intravenous contrast. A custom protocol to survey the distal forearm and proximal hand was utilized, but orthopedic wrist or hand protocol was not included.  CONTRAST:  15mL MULTIHANCE GADOBENATE DIMEGLUMINE 529 MG/ML IV SOLN  COMPARISON:  09/14/2003 radiographs  FINDINGS: Abnormal edema spans from the proximal -medial forearm and into the hand and fingers within the subcutaneous tissues, with associated abnormal enhancement favoring cellulitis. There is also abnormal edema and enhancement tracking in the extensor forearm musculature, especially the extensor digitorum but also within along the remaining extensor muscles. No drainable abscess is directly observed. Extensor peritendinitis is confluent with the subcutaneous edema and enhancement.  No flexor compartment edema or enhancement noted. No significant abnormal osseous edema.  IMPRESSION: 1. Cellulitis and extensor compartment myositis/fasciitis in the forearm, with the cellulitis component extending into the dorsal hand. No current drainable abscess.   Electronically Signed   By: Herbie Baltimore M.D.   On: 02/16/2014 15:06     CBC  Recent Labs Lab 02/16/14 1100 02/17/14 0635 02/18/14 0643  WBC 10.1 7.4 6.4  HGB 15.5* 13.4 12.9  HCT 47.0* 41.5 40.2  PLT 246 199 177  MCV 94.2 94.1 93.9  MCH 31.1 30.4 30.1  MCHC 33.0 32.3 32.1  RDW 14.6 15.0 14.8  LYMPHSABS 1.8  --   --   MONOABS 1.1*  --   --   EOSABS 0.0  --   --   BASOSABS 0.0  --   --     Chemistries   Recent Labs Lab 02/16/14 1100 02/17/14 0635 02/18/14 0643  NA 142 140 143  K 4.5 4.7 4.0  CL 103 103 105  CO2 24 26 26   GLUCOSE 98 94 111*  BUN <3* 5* 4*  CREATININE 0.73 0.75 0.75  CALCIUM 9.2 8.7 8.6   ------------------------------------------------------------------------------------------------------------------ estimated creatinine clearance is 100 ml/min (by C-G formula based on Cr of  0.75). ------------------------------------------------------------------------------------------------------------------ No results found for this basename: HGBA1C,  in the last 72 hours ------------------------------------------------------------------------------------------------------------------ No results found for this basename: CHOL, HDL, LDLCALC, TRIG, CHOLHDL, LDLDIRECT,  in the last 72 hours ------------------------------------------------------------------------------------------------------------------ No results found for this basename: TSH, T4TOTAL, FREET3, T3FREE, THYROIDAB,  in the last 72 hours ------------------------------------------------------------------------------------------------------------------ No results found for this basename: VITAMINB12, FOLATE, FERRITIN, TIBC, IRON, RETICCTPCT,  in the last 72 hours  Coagulation profile No results found for this basename: INR, PROTIME,  in the last 168 hours  No results found for this basename: DDIMER,  in the last 72 hours  Cardiac Enzymes No results found for this basename: CK, CKMB, TROPONINI, MYOGLOBIN,  in the last 168 hours ------------------------------------------------------------------------------------------------------------------ No components found with this basename: POCBNP,      Time Spent in minutes   35   Susa Raring K M.D on 02/19/2014 at 11:24 AM  Between 7am to 7pm - Pager - [phone]  After 7pm go to [email or web address] - password [email or web address]  And look for the night coverage person covering for me after hours  [facility] Office  [phone]   **Disclaimer: This note may have been dictated with voice recognition software. Similar sounding words can inadvertently be transcribed and this note may contain transcription errors which may not have been corrected upon publication of note.**

## 2014-02-20 MED ORDER — FLUCONAZOLE 100 MG PO TABS: 100.0000 mg | ORAL_TABLET | Freq: Every day | ORAL | Status: DC

## 2014-02-20 MED ORDER — CEPHALEXIN 500 MG PO CAPS: 500.0000 mg | ORAL_CAPSULE | Freq: Four times a day (QID) | ORAL | Status: DC

## 2014-02-20 MED ORDER — CLOTRIMAZOLE 2 % VA CREA: 1.0000 | TOPICAL_CREAM | Freq: Every day | VAGINAL | Status: DC

## 2014-02-20 MED ORDER — OXYCODONE HCL 5 MG PO TABS: 5.0000 mg | ORAL_TABLET | ORAL | Status: DC | PRN

## 2014-02-20 MED ORDER — DOXYCYCLINE HYCLATE 100 MG PO CAPS: 100.0000 mg | ORAL_CAPSULE | Freq: Two times a day (BID) | ORAL | Status: DC

## 2014-02-20 NOTE — Discharge Instructions (Signed)
Keep your L.Arm elevated at all times, do the pump action as taught 10 times each hour.  Follow with Primary MD Default, Provider, MD in 2 days   Get CBC, CMP, checked  by Primary MD next visit.    Activity: As tolerated with Full fall precautions use walker/cane & assistance as needed   Disposition Home     Diet: Heart Healthy    For Heart failure patients - Check your Weight same time everyday, if you gain over 2 pounds, or you develop in leg swelling, experience more shortness of breath or chest pain, call your Primary MD immediately. Follow Cardiac Low Salt Diet and 1.8 lit/day fluid restriction.   On your next visit with her primary care physician please Get Medicines reviewed and adjusted.  Please request your Prim.MD to go over all Hospital Tests and Procedure/Radiological results at the follow up, please get all Hospital records sent to your Prim MD by signing hospital release before you go home.   If you experience worsening of your admission symptoms, develop shortness of breath, life threatening emergency, suicidal or homicidal thoughts you must seek medical attention immediately by calling 911 or calling your MD immediately  if symptoms less severe.  You Must read complete instructions/literature along with all the possible adverse reactions/side effects for all the Medicines you take and that have been prescribed to you. Take any new Medicines after you have completely understood and accpet all the possible adverse reactions/side effects.   Do not drive, operating heavy machinery, perform activities at heights, swimming or participation in water activities or provide baby sitting services if your were admitted for syncope or siezures until you have seen by Primary MD or a Neurologist and advised to do so again.  Do not drive when taking Pain medications.    Do not take more than prescribed Pain, Sleep and Anxiety Medications  Special Instructions: If you have smoked or  chewed Tobacco  in the last 2 yrs please stop smoking, stop any regular Alcohol  and or any Recreational drug use.  Wear Seat belts while driving.   Please note  You were cared for by a hospitalist during your hospital stay. If you have any questions about your discharge medications or the care you received while you were in the hospital after you are discharged, you can call the unit and asked to speak with the hospitalist on call if the hospitalist that took care of you is not available. Once you are discharged, your primary care physician will handle any further medical issues. Please note that NO REFILLS for any discharge medications will be authorized once you are discharged, as it is imperative that you return to your primary care physician (or establish a relationship with a primary care physician if you do not have one) for your aftercare needs so that they can reassess your need for medications and monitor your lab values.

## 2014-02-20 NOTE — Discharge Summary (Signed)
WILNA PENNIE, is a 33 y.o. female  DOB Dec 20, 1980  MRN [id number].  Admission date:  02/16/2014  Admitting Physician  Penny Pia, MD  Discharge Date:  02/20/2014   Primary MD  Default, Provider, MD  Recommendations for primary care physician for things to follow:   Repeat CBC BMP in 3 days monitor left arm cellulitis clinically.   Admission Diagnosis  Myositis   Discharge Diagnosis  Myositis/cellulitis  Active Problems:   Myositis   Cellulitis   Anxiety and depression      Past Medical History  Diagnosis Date  . Anxiety   . Bipolar 1 disorder     Past Surgical History  Procedure Laterality Date  . Tubal ligation         History of present illness and  Hospital Course:     Kindly see H&P for history of present illness and admission details, please review complete Labs, Consult reports and Test reports for all details in brief  HPI  from the history and physical done on the day of admission  QUINCEE GITTENS is a 33 y.o. female with a prior history of anxiety and depression who presents with two-day complaint of left arm discomfort. The discomfort is located in the dorsum of the arm and hand. Nothing she is aware of makes it better. Moving her hand makes it worse and pressure over affected area makes it worse as well. She denies any recent trauma to the area. Patient states that since she first noticed the affected area the discomfort has progressively gotten worse. While in the ED patient had MRI and the ED physician consulted hand surgeon. MRI reported cellulitis and extensor compartment myositis/fasciitis in the forearm.     Hospital Course     1. L Arm Myositis with edema and possible cellulitis. Etiology unclear, she did do heavy lifting while moving multiple boxes out of her car the day this  happened, question if this is left arm myositis due to overexertion, CK stable. ID and Hand surgery saw the patient. Blood cultures. Treated her empirically with IV vancomycin cefazolin. No fever or leukocytosis. Exam stable and much improved. Currently no signs of compartment syndrome.   Continue left arm elevation patient educated, seen and cleared for home discharge by hand surgery and ID physician Dr. Orvan Falconer switched to oral antibiotics combination of doxycycline and Keflex for 6 more days as instructed by ID. Will follow with PCP along with hand surgery in the outpatient setting post discharge.      2. Anxiety and depression - no acute issues not suicidal or homicidal, continue Abilify and Effexor.       Discharge Condition: Stable   Follow UP  Follow-up Information   Follow up with Your PCP. Schedule an appointment as soon as possible for a visit in 2 days.      Follow up with Karen Chafe, MD. Schedule an appointment as soon as possible for a visit in 5 days.   Specialty:  Orthopedic Surgery   Contact  information:   [address] [place] [address] [phone]         Discharge Instructions  and  Discharge Medications     Discharge Instructions   Diet - low sodium heart healthy    Complete by:  As directed      Discharge instructions    Complete by:  As directed   Keep your L.Arm elevated at all times, do the pump action as taught 10 times each hour.  Follow with Primary MD Default, Provider, MD in 2 days   Get CBC, CMP, checked  by Primary MD next visit.    Activity: As tolerated with Full fall precautions use walker/cane & assistance as needed   Disposition Home     Diet: Heart Healthy    For Heart failure patients - Check your Weight same time everyday, if you gain over 2 pounds, or you develop in leg swelling, experience more shortness of breath or chest pain, call your Primary MD immediately. Follow Cardiac Low Salt  Diet and 1.8 lit/day fluid restriction.   On your next visit with her primary care physician please Get Medicines reviewed and adjusted.  Please request your Prim.MD to go over all Hospital Tests and Procedure/Radiological results at the follow up, please get all Hospital records sent to your Prim MD by signing hospital release before you go home.   If you experience worsening of your admission symptoms, develop shortness of breath, life threatening emergency, suicidal or homicidal thoughts you must seek medical attention immediately by calling 911 or calling your MD immediately  if symptoms less severe.  You Must read complete instructions/literature along with all the possible adverse reactions/side effects for all the Medicines you take and that have been prescribed to you. Take any new Medicines after you have completely understood and accpet all the possible adverse reactions/side effects.   Do not drive, operating heavy machinery, perform activities at heights, swimming or participation in water activities or provide baby sitting services if your were admitted for syncope or siezures until you have seen by Primary MD or a Neurologist and advised to do so again.  Do not drive when taking Pain medications.    Do not take more than prescribed Pain, Sleep and Anxiety Medications  Special Instructions: If you have smoked or chewed Tobacco  in the last 2 yrs please stop smoking, stop any regular Alcohol  and or any Recreational drug use.  Wear Seat belts while driving.   Please note  You were cared for by a hospitalist during your hospital stay. If you have any questions about your discharge medications or the care you received while you were in the hospital after you are discharged, you can call the unit and asked to speak with the hospitalist on call if the hospitalist that took care of you is not available. Once you are discharged, your primary care physician will handle any further  medical issues. Please note that NO REFILLS for any discharge medications will be authorized once you are discharged, as it is imperative that you return to your primary care physician (or establish a relationship with a primary care physician if you do not have one) for your aftercare needs so that they can reassess your need for medications and monitor your lab values.  Keep your L.Arm elevated at all times, do the pump action as taught 10 times each hour.  Follow with Primary MD Default, Provider, MD in 2 days   Get CBC, CMP,  checked  by Primary MD next visit.    Activity: As tolerated with Full fall precautions use walker/cane & assistance as needed   Disposition Home     Diet: Heart Healthy    For Heart failure patients - Check your Weight same time everyday, if you gain over 2 pounds, or you develop in leg swelling, experience more shortness of breath or chest pain, call your Primary MD immediately. Follow Cardiac Low Salt Diet and 1.8 lit/day fluid restriction.   On your next visit with her primary care physician please Get Medicines reviewed and adjusted.  Please request your Prim.MD to go over all Hospital Tests and Procedure/Radiological results at the follow up, please get all Hospital records sent to your Prim MD by signing hospital release before you go home.   If you experience worsening of your admission symptoms, develop shortness of breath, life threatening emergency, suicidal or homicidal thoughts you must seek medical attention immediately by calling 911 or calling your MD immediately  if symptoms less severe.  You Must read complete instructions/literature along with all the possible adverse reactions/side effects for all the Medicines you take and that have been prescribed to you. Take any new Medicines after you have completely understood and accpet all the possible adverse reactions/side effects.   Do not drive, operating heavy machinery, perform activities at  heights, swimming or participation in water activities or provide baby sitting services if your were admitted for syncope or siezures until you have seen by Primary MD or a Neurologist and advised to do so again.  Do not drive when taking Pain medications.    Do not take more than prescribed Pain, Sleep and Anxiety Medications  Special Instructions: If you have smoked or chewed Tobacco  in the last 2 yrs please stop smoking, stop any regular Alcohol  and or any Recreational drug use.  Wear Seat belts while driving.   Please note  You were cared for by a hospitalist during your hospital stay. If you have any questions about your discharge medications or the care you received while you were in the hospital after you are discharged, you can call the unit and asked to speak with the hospitalist on call if the hospitalist that took care of you is not available. Once you are discharged, your primary care physician will handle any further medical issues. Please note that NO REFILLS for any discharge medications will be authorized once you are discharged, as it is imperative that you return to your primary care physician (or establish a relationship with a primary care physician if you do not have one) for your aftercare needs so that they can reassess your need for medications and monitor your lab values.     Increase activity slowly    Complete by:  As directed             Medication List         ARIPiprazole 2 MG tablet  Commonly known as:  ABILIFY  Take 2 mg by mouth daily.     cephALEXin 500 MG capsule  Commonly known as:  KEFLEX  Take 1 capsule (500 mg total) by mouth 4 (four) times daily.     clotrimazole 2 % vaginal cream  Commonly known as:  GYNE-LOTRIMIN 3  Place 1 Applicatorful vaginally at bedtime.     doxycycline 100 MG capsule  Commonly known as:  VIBRAMYCIN  Take 1 capsule (100 mg total) by mouth 2 (two) times daily.  fluconazole 100 MG tablet  Commonly known as:   DIFLUCAN  Take 1 tablet (100 mg total) by mouth daily.     gabapentin 800 MG tablet  Commonly known as:  NEURONTIN  Take 800 mg by mouth 3 (three) times daily.     ibuprofen 200 MG tablet  Commonly known as:  ADVIL,MOTRIN  Take 800 mg by mouth every 6 (six) hours as needed for pain.     oxyCODONE 5 MG immediate release tablet  Commonly known as:  Oxy IR/ROXICODONE  Take 1 tablet (5 mg total) by mouth every 4 (four) hours as needed for severe pain.     venlafaxine XR 150 MG 24 hr capsule  Commonly known as:  EFFEXOR-XR  Take 150 mg by mouth daily. Take with 75mg  capsule for a total of 225mg .     venlafaxine XR 75 MG 24 hr capsule  Commonly known as:  EFFEXOR-XR  Take 75 mg by mouth daily with breakfast. Take with 150mg  capsule for a total of 225mg .          Diet and Activity recommendation: See Discharge Instructions above   Consults obtained - ID, hand surgery   Major procedures and Radiology Reports - PLEASE review detailed and final reports for all details, in brief -     Mr Forearm Left W/cm  02/16/2014   CLINICAL DATA:  Erythema, pain, and swelling of the left forearm.  EXAM: MRI OF THE LEFT FOREARM WITH CONTRAST  TECHNIQUE: Multiplanar, multisequence MR imaging was performed following the administration of intravenous contrast. A custom protocol to survey the distal forearm and proximal hand was utilized, but orthopedic wrist or hand protocol was not included.  CONTRAST:  15mL MULTIHANCE GADOBENATE DIMEGLUMINE 529 MG/ML IV SOLN  COMPARISON:  09/14/2003 radiographs  FINDINGS: Abnormal edema spans from the proximal -medial forearm and into the hand and fingers within the subcutaneous tissues, with associated abnormal enhancement favoring cellulitis. There is also abnormal edema and enhancement tracking in the extensor forearm musculature, especially the extensor digitorum but also within along the remaining extensor muscles. No drainable abscess is directly observed.  Extensor peritendinitis is confluent with the subcutaneous edema and enhancement.  No flexor compartment edema or enhancement noted. No significant abnormal osseous edema.  IMPRESSION: 1. Cellulitis and extensor compartment myositis/fasciitis in the forearm, with the cellulitis component extending into the dorsal hand. No current drainable abscess.   Electronically Signed   By: Herbie Baltimore M.D.   On: 02/16/2014 15:06    Micro Results     Recent Results (from the past 240 hour(s))  CULTURE, BLOOD (ROUTINE X 2)     Status: None   Collection Time    02/16/14  7:25 PM      Result Value Ref Range Status   Specimen Description BLOOD RIGHT ARM   Final   Special Requests BOTTLES DRAWN AEROBIC ONLY 10 CC   Final   Culture  Setup Time     Final   Value: 02/17/2014 01:07     Performed at [facility]   Culture     Final   Value:        BLOOD CULTURE RECEIVED NO GROWTH TO DATE CULTURE WILL BE HELD FOR 5 DAYS BEFORE ISSUING A FINAL NEGATIVE REPORT     Performed at [facility]   Report Status PENDING   Incomplete  CULTURE, BLOOD (ROUTINE X 2)     Status: None   Collection Time    02/16/14  7:35  PM      Result Value Ref Range Status   Specimen Description BLOOD RIGHT ARM   Final   Special Requests BOTTLES DRAWN AEROBIC ONLY 1 CC   Final   Culture  Setup Time     Final   Value: 02/17/2014 01:07     [facility]   Culture     Final   Value:        BLOOD CULTURE RECEIVED NO GROWTH TO DATE CULTURE WILL BE HELD FOR 5 DAYS BEFORE ISSUING A FINAL NEGATIVE REPORT     [facility]formed at [facility]   Report Status PENDING   Incomplete       Today   Subjective:   Cleopatra CedarChristy Highley today has no headache,no chest abdominal pain,no new weakness tingling or numbness, feels much better wants to go home today.    Objective:   Blood pressure 119/59, pulse 67, temperature 97.9 F (36.6 C), temperature source Oral, resp. rate 16, height 5\' 8"  (1.727 m),  weight 74.844 kg (165 lb), SpO2 98.00%.   Intake/Output Summary (Last 24 hours) at 02/20/14 0917 Last data filed at 02/20/14 0800  Gross per 24 hour  Intake    840 ml  Output      0 ml  Net    840 ml    Exam Awake Alert, Oriented x 3, No new F.N deficits, Normal affect Allen.AT,PERRAL Supple Neck,No JVD, No cervical lymphadenopathy appriciated.  Symmetrical Chest wall movement, Good air movement bilaterally, CTAB RRR,No Gallops,Rubs or new Murmurs, No Parasternal Heave +ve B.Sounds, Abd Soft, Non tender, No organomegaly appriciated, No rebound -guarding or rigidity. No Cyanosis, Clubbing or edema, No new Rash or bruise, left arm and hand swelling much improved, no warmth or erythema.  Data Review   CBC w Diff: Lab Results  Component Value Date   WBC 6.4 02/18/2014   HGB 12.9 02/18/2014   HCT 40.2 02/18/2014   PLT 177 02/18/2014   LYMPHOPCT 17 02/16/2014   MONOPCT 11 02/16/2014   EOSPCT 0 02/16/2014   BASOPCT 0 02/16/2014    CMP: Lab Results  Component Value Date   NA 143 02/18/2014   K 4.0 02/18/2014   CL 105 02/18/2014   CO2 26 02/18/2014   BUN 4* 02/18/2014   CREATININE 0.75 02/18/2014   PROT 7.0 07/10/2013   ALBUMIN 3.5 07/10/2013   BILITOT 0.2* 07/10/2013   ALKPHOS 93 07/10/2013   AST 23 07/10/2013   ALT 29 07/10/2013  .   Total Time in preparing paper work, data evaluation and todays exam - 35 minutes  Leroy SeaSINGH,Xiao Graul K M.D on 02/20/2014 at 9:17 AM  [facility] Office  [phone]   **Disclaimer: This note may have been dictated with voice recognition software. Similar sounding words can inadvertently be transcribed and this note may contain transcription errors which may not have been corrected upon publication of note.**

## 2014-02-23 LAB — CULTURE, BLOOD (ROUTINE X 2)
Culture: NO GROWTH
Culture: NO GROWTH

## 2014-04-10 ENCOUNTER — Emergency Department (HOSPITAL_COMMUNITY)
Admission: EM | Admit: 2014-04-10 | Discharge: 2014-04-11 | Disposition: A | Discharge status: Home / Self Care | Payer: Medicaid Other | Attending: Emergency Medicine | Admitting: Emergency Medicine

## 2014-04-10 ENCOUNTER — Encounter (HOSPITAL_COMMUNITY): Payer: Self-pay | Admitting: Emergency Medicine

## 2014-04-10 DIAGNOSIS — Z79899 Other long term (current) drug therapy: Secondary | ICD-10-CM | POA: Insufficient documentation

## 2014-04-10 DIAGNOSIS — F17200 Nicotine dependence, unspecified, uncomplicated: Secondary | ICD-10-CM | POA: Diagnosis not present

## 2014-04-10 DIAGNOSIS — F411 Generalized anxiety disorder: Secondary | ICD-10-CM | POA: Diagnosis not present

## 2014-04-10 DIAGNOSIS — I10 Essential (primary) hypertension: Secondary | ICD-10-CM | POA: Insufficient documentation

## 2014-04-10 DIAGNOSIS — Y939 Activity, unspecified: Secondary | ICD-10-CM | POA: Insufficient documentation

## 2014-04-10 DIAGNOSIS — Z91040 Latex allergy status: Secondary | ICD-10-CM | POA: Insufficient documentation

## 2014-04-10 DIAGNOSIS — F3289 Other specified depressive episodes: Secondary | ICD-10-CM | POA: Diagnosis not present

## 2014-04-10 DIAGNOSIS — F1910 Other psychoactive substance abuse, uncomplicated: Secondary | ICD-10-CM

## 2014-04-10 DIAGNOSIS — F1410 Cocaine abuse, uncomplicated: Secondary | ICD-10-CM | POA: Diagnosis not present

## 2014-04-10 DIAGNOSIS — R404 Transient alteration of awareness: Secondary | ICD-10-CM | POA: Diagnosis present

## 2014-04-10 DIAGNOSIS — F329 Major depressive disorder, single episode, unspecified: Secondary | ICD-10-CM | POA: Diagnosis not present

## 2014-04-10 DIAGNOSIS — Y929 Unspecified place or not applicable: Secondary | ICD-10-CM | POA: Diagnosis not present

## 2014-04-10 DIAGNOSIS — Z3202 Encounter for pregnancy test, result negative: Secondary | ICD-10-CM | POA: Insufficient documentation

## 2014-04-10 DIAGNOSIS — R55 Syncope and collapse: Secondary | ICD-10-CM | POA: Diagnosis not present

## 2014-04-10 DIAGNOSIS — S93409A Sprain of unspecified ligament of unspecified ankle, initial encounter: Secondary | ICD-10-CM | POA: Insufficient documentation

## 2014-04-10 DIAGNOSIS — F1210 Cannabis abuse, uncomplicated: Secondary | ICD-10-CM | POA: Diagnosis not present

## 2014-04-10 DIAGNOSIS — R296 Repeated falls: Secondary | ICD-10-CM | POA: Insufficient documentation

## 2014-04-10 DIAGNOSIS — S93401A Sprain of unspecified ligament of right ankle, initial encounter: Secondary | ICD-10-CM

## 2014-04-10 HISTORY — DX: Major depressive disorder, single episode, unspecified: F32.9

## 2014-04-10 HISTORY — DX: Depression, unspecified: F32.A

## 2014-04-10 LAB — I-STAT CHEM 8, ED
BUN: <3 mg/dL — ABNORMAL LOW (ref 6–23)
Calcium, Ion: 1.12 mmol/L (ref 1.12–1.23)
Chloride: 102 mEq/L (ref 96–112)
Creatinine, Ser: 0.9 mg/dL (ref 0.50–1.10)
Glucose, Bld: 85 mg/dL (ref 70–99)
HCT: 47 % — ABNORMAL HIGH (ref 36.0–46.0)
Hemoglobin: 16 g/dL — ABNORMAL HIGH (ref 12.0–15.0)
Potassium: 4.3 mEq/L (ref 3.7–5.3)
Sodium: 137 mEq/L (ref 137–147)
TCO2: 26 mmol/L (ref 0–100)

## 2014-04-10 LAB — I-STAT TROPONIN, ED: Troponin i, poc: 0 ng/mL (ref 0.00–0.08)

## 2014-04-10 LAB — ETHANOL: Alcohol, Ethyl (B): <11 mg/dL (ref 0–11)

## 2014-04-10 MED ORDER — SODIUM CHLORIDE 0.9 % IV BOLUS (SEPSIS)
1000.0000 mL | Freq: Once | INTRAVENOUS | Status: AC
  Administered 2014-04-10: 1000 mL via INTRAVENOUS

## 2014-04-10 MED ORDER — KETOROLAC TROMETHAMINE 30 MG/ML IJ SOLN
30.0000 mg | Freq: Once | INTRAMUSCULAR | Status: AC
  Administered 2014-04-10: 30 mg via INTRAVENOUS
  Filled 2014-04-10: qty 1

## 2014-04-10 NOTE — ED Provider Notes (Signed)
CSN: [id number]     Arrival date & time 04/10/14  2117 History   First MD Initiated Contact with Patient 04/10/14 2122     Chief Complaint  Patient presents with  . Loss of Consciousness   HPI  History provided by the patient. Patient is a 33 year old female with history of bipolar disorder, anxiety, depression and hypertension who presents after 2 syncopal episodes and possible seizure today. Patient reports that the first episode occurred while she was getting ready to take dishes to the covered. She reports becoming lightheaded and falling to the ground but does not recall exactly what happened. This was brief and she was helped up by friend. She was then tried to rest and relax and went outside to have a cigarette and when she came back inside and was walking became lightheaded again and fell. Her friend reports that she had some shaking of the extremities. There was no tongue biting. No urinary or fecal incontinence. No post ictal confusion. She was unconscious for only a few seconds. Prior to these episodes patient does admit to alcohol use which he has been drinking daily for the past week. She has also used cocaine today. After falling she complains of right lower leg pain. Denies any specific injury. She also has some anxiety, hyperventilation and shortness of breath. She states she did not want to come to the emergency room she is feeling very anxious inside the small room.    Past Medical History  Diagnosis Date  . Anxiety   . Bipolar 1 disorder   . Depression   . Hypertension    Past Surgical History  Procedure Laterality Date  . Tubal ligation     History reviewed. No pertinent family history. History  Substance Use Topics  . Smoking status: Current Every Day Smoker -- 1.00 packs/day    Types: Cigarettes  . Smokeless tobacco: Never Used  . Alcohol Use: Yes     Comment: twisted tea and a few beers everyday   OB History   Grav Para Term Preterm Abortions TAB SAB Ect Mult  Living                 Review of Systems  Respiratory: Positive for shortness of breath.   Musculoskeletal: Negative for back pain.  Neurological: Positive for dizziness and light-headedness. Negative for headaches.  Psychiatric/Behavioral: The patient is nervous/anxious.   All other systems reviewed and are negative.     Allergies  Latex  Home Medications   Prior to Admission medications   Medication Sig Start Date End Date Taking? Authorizing Provider  ARIPiprazole (ABILIFY) 2 MG tablet Take 2 mg by mouth daily.   Yes Historical Provider, MD  gabapentin (NEURONTIN) 800 MG tablet Take 800 mg by mouth 3 (three) times daily.   Yes Historical Provider, MD  ibuprofen (ADVIL,MOTRIN) 200 MG tablet Take 800 mg by mouth every 6 (six) hours as needed for pain.   Yes Historical Provider, MD  venlafaxine XR (EFFEXOR-XR) 150 MG 24 hr capsule Take 150 mg by mouth daily. Take with  capsule for a total of .   Yes Historical Provider, MD  venlafaxine XR (EFFEXOR-XR) 75 MG 24 hr capsule Take 75 mg by mouth daily with breakfast. Take with  capsule for a total of .   Yes Historical Provider, MD   BP 115/79  Pulse 79  Temp(Src) 98.3 F (36.8 C) (Oral)  Resp 17  SpO2 91% Physical Exam  Nursing note and vitals reviewed. Constitutional: She  is oriented to person, place, and time. She appears well-developed and well-nourished. No distress.  HENT:  Head: Normocephalic and atraumatic.  Eyes: Conjunctivae and EOM are normal. Pupils are equal, round, and reactive to light.  Neck: Normal range of motion. Neck supple.  No cervical midline tenderness.  Cardiovascular: Normal rate and regular rhythm.   Pulmonary/Chest: Effort normal and breath sounds normal. No respiratory distress. She has no wheezes. She has no rales.  Abdominal: Soft. There is no tenderness.  Musculoskeletal: She exhibits tenderness. She exhibits no edema.  Patient complains of severe pain to her right lower leg  even with light touch. There is no deformity or swelling of the lower leg. She appears tender over the gastrocnemius muscle and tibia without deformity or marks or bruising. No pain into the ankle. Normal pulses and sensation. Normal strength of the foot. Patient was ambulatory reporting improved pain while walking on her tiptoes. Normal knee exam.  Neurological: She is alert and oriented to person, place, and time.  Patient has a slight tremor to the right hand. This does improve slightly when she is performing a task.  Skin: Skin is warm and dry. No rash noted.  Psychiatric: She has a normal mood and affect. Her behavior is normal.    ED Course  Procedures   COORDINATION OF CARE:  Nursing notes reviewed. Vital signs reviewed. Initial pt interview and examination performed.   Filed Vitals:   04/11/14 0100 04/11/14 0115 04/11/14 0130 04/11/14 0200  BP: 116/71 127/68 119/75 115/79  Pulse: 87 78 86 79  Temp:      TempSrc:      Resp: SpO2: 98% 93% 92% 91%    9:45 PM-patient seen and evaluated. Patient with rapid speech. She is slightly tremulous in the right upper extremity. Anxious appearing. Does admit to drug and alcohol use prior to arrival. No past history of seizure disorder. Story does not appear consistent with typical seizure. Patient workup initiated   Laboratory testing unremarkable for any concerning findings. Right ankle x-ray is negative. Possible slight sprain. Will provide Ace wrap and crutches. Discussed findings with the patient. At this time she is stable for discharge. She agrees with plan.   Treatment plan initiated: Medications  sodium chloride 0.9 % bolus 1,000 mL (not administered)    Results for orders placed during the hospital encounter of 04/10/14  URINE RAPID DRUG SCREEN (HOSP PERFORMED)      Result Value Ref Range   Opiates NONE DETECTED  NONE DETECTED   Cocaine POSITIVE (*) NONE DETECTED   Benzodiazepines NONE DETECTED  NONE DETECTED    Amphetamines NONE DETECTED  NONE DETECTED   Tetrahydrocannabinol POSITIVE (*) NONE DETECTED   Barbiturates NONE DETECTED  NONE DETECTED  ETHANOL      Result Value Ref Range   Alcohol, Ethyl (B) <11  0 - 11 mg/dL  I-STAT CHEM 8, ED      Result Value Ref Range   Sodium 137  137 - 147 mEq/L   Potassium 4.3  3.7 - 5.3 mEq/L   Chloride 102  96 - 112 mEq/L   BUN <3 (*) 6 - 23 mg/dL   Creatinine, Ser 1.61  0.50 - 1.10 mg/dL   Glucose, Bld 85  70 - 99 mg/dL   Calcium, Ion 0.96  0.45 - 1.23 mmol/L   TCO2 26  0 - 100 mmol/L   Hemoglobin 16.0 (*) 12.0 - 15.0 g/dL   HCT 40.9 (*) 81.1 -  46.0 %  POC URINE PREG, ED      Result Value Ref Range   Preg Test, Ur NEGATIVE  NEGATIVE  I-STAT TROPOININ, ED      Result Value Ref Range   Troponin i, poc 0.00  0.00 - 0.08 ng/mL   Comment 3                Imaging Review Dg Ankle Complete Right  04/11/2014   CLINICAL DATA:  Fall.  Right ankle pain and swelling.  EXAM: RIGHT ANKLE - COMPLETE 3+ VIEW  COMPARISON:  05/26/2013  FINDINGS: There is no evidence of fracture, dislocation, or joint effusion. There is no evidence of arthropathy or other focal bone abnormality. Soft tissues are unremarkable.  IMPRESSION: Negative.   Electronically Signed   By: Myles Rosenthal M.D.   On: 04/11/2014 00:25     EKG Interpretation   Date/Time:  Saturday April 10 2014 21:20:39 EDT Ventricular Rate:  124 PR Interval:  125 QRS Duration: 101 QT Interval:  336 QTC Calculation: 483 R Axis:   78 Text Interpretation:  Sinus tachycardia Paired ventricular premature  complexes Borderline repolarization abnormality Borderline prolonged QT  interval Confirmed by WARD,  DO, KRISTEN [phone]) on 04/10/2014 9:46:47 PM      MDM   Final diagnoses:  Syncope and collapse  Substance abuse  Ankle sprain, right, initial encounter        Angus Seller, PA-C 04/11/14 0400

## 2014-04-10 NOTE — ED Notes (Signed)
Pt reports to the ED for eval of 2 syncopal episodes. Pt had the first syncopal episode and then went out to smoke a cigarette and then when she came back inside she had another episode. The second episode was described as seizure like activity and then a syncopal episode. Pt denies head injury. Pt is reporting some right leg pain and right arm twitching. Denies any neck or back pain. Pt reports she drank a twisted tea and did some cocaine. Unsure of the last time she drank today but it was after the first fall. Pt reports she has been drinking every day this week. Pt reports she did the cocaine before 1600 today and she just had "3 bumps." Pt reports this is the first time she had this particular batch of cocaine. Pt sinus tach on the monitor. Reports some chest pressure and tightness and SOB, lightheadedness, and diaphoresis. Pt A&Ox4, resp e/u, and skin warm and dry.

## 2014-04-11 ENCOUNTER — Emergency Department (HOSPITAL_COMMUNITY): Payer: Medicaid Other

## 2014-04-11 LAB — POC URINE PREG, ED: Preg Test, Ur: NEGATIVE

## 2014-04-11 LAB — RAPID URINE DRUG SCREEN, HOSP PERFORMED
Amphetamines: NOT DETECTED
Barbiturates: NOT DETECTED
Benzodiazepines: NOT DETECTED
Cocaine: POSITIVE — AB
Opiates: NOT DETECTED
Tetrahydrocannabinol: POSITIVE — AB

## 2014-04-11 NOTE — Discharge Instructions (Signed)
Your lab testing did not show any concerning or emergent condition to explain your symptoms. Please follow up with a primary doctor for continued evaluation and treatment.     Syncope Syncope is a medical term for fainting or passing out. This means you lose consciousness and drop to the ground. People are generally unconscious for less than 5 minutes. You may have some muscle twitches for up to 15 seconds before waking up and returning to normal. Syncope occurs more often in older adults, but it can happen to anyone. While most causes of syncope are not dangerous, syncope can be a sign of a serious medical problem. It is important to seek medical care.  CAUSES  Syncope is caused by a sudden drop in blood flow to the brain. The specific cause is often not determined. Factors that can bring on syncope include:  Taking medicines that lower blood pressure.  Sudden changes in posture, such as standing up quickly.  Taking more medicine than prescribed.  Standing in one place for too long.  Seizure disorders.  Dehydration and excessive exposure to heat.  Low blood sugar (hypoglycemia).  Straining to have a bowel movement.  Heart disease, irregular heartbeat, or other circulatory problems.  Fear, emotional distress, seeing blood, or severe pain. SYMPTOMS  Right before fainting, you may:  Feel dizzy or light-headed.  Feel nauseous.  See all white or all black in your field of vision.  Have cold, clammy skin. DIAGNOSIS  Your health care provider will ask about your symptoms, perform a physical exam, and perform an electrocardiogram (ECG) to record the electrical activity of your heart. Your health care provider may also perform other heart or blood tests to determine the cause of your syncope which may include:  Transthoracic echocardiogram (TTE). During echocardiography, sound waves are used to evaluate how blood flows through your heart.  Transesophageal echocardiogram  (TEE).  Cardiac monitoring. This allows your health care provider to monitor your heart rate and rhythm in real time.  Holter monitor. This is a portable device that records your heartbeat and can help diagnose heart arrhythmias. It allows your health care provider to track your heart activity for several days, if needed.  Stress tests by exercise or by giving medicine that makes the heart beat faster. TREATMENT  In most cases, no treatment is needed. Depending on the cause of your syncope, your health care provider may recommend changing or stopping some of your medicines. HOME CARE INSTRUCTIONS  Have someone stay with you until you feel stable.  Do not drive, use machinery, or play sports until your health care provider says it is okay.  Keep all follow-up appointments as directed by your health care provider.  Lie down right away if you start feeling like you might faint. Breathe deeply and steadily. Wait until all the symptoms have passed.  Drink enough fluids to keep your urine clear or pale yellow.  If you are taking blood pressure or heart medicine, get up slowly and take several minutes to sit and then stand. This can reduce dizziness. SEEK IMMEDIATE MEDICAL CARE IF:   You have a severe headache.  You have unusual pain in the chest, abdomen, or back.  You are bleeding from your mouth or rectum, or you have black or tarry stool.  You have an irregular or very fast heartbeat.  You have pain with breathing.  You have repeated fainting or seizure-like jerking during an episode.  You faint when sitting or lying down.  You  have confusion.  You have trouble walking.  You have severe weakness.  You have vision problems. If you fainted, call your local emergency services (911 in [place] Do not drive yourself to the hospital.  MAKE SURE YOU:  Understand these instructions.  Will watch your condition.  Will get help right away if you are not doing well or get  worse. Document Released: 07/23/2005 Document Revised: 07/28/2013 Document Reviewed: 09/21/2011 [facility] Patient Information 2015 [place], [place]. This information is not intended to replace advice given to you by your health care provider. Make sure you discuss any questions you have with your health care provider.    Ankle Sprain An ankle sprain is an injury to the strong, fibrous tissues (ligaments) that hold your ankle bones together.  HOME CARE   Put ice on your ankle for 1-2 days or as told by your doctor.  Put ice in a plastic bag.  Place a towel between your skin and the bag.  Leave the ice on for 15-20 minutes at a time, every 2 hours while you are awake.  Only take medicine as told by your doctor.  Raise (elevate) your injured ankle above the level of your heart as much as possible for 2-3 days.  Use crutches if your doctor tells you to. Slowly put your own weight on the affected ankle. Use the crutches until you can walk without pain.  If you have a plaster splint:  Do not rest it on anything harder than a pillow for 24 hours.  Do not put weight on it.  Do not get it wet.  Take it off to shower or bathe.  If given, use an elastic wrap or support stocking for support. Take the wrap off if your toes lose feeling (numb), tingle, or turn cold or blue.  If you have an air splint:  Add or let out air to make it comfortable.  Take it off at night and to shower and bathe.  Wiggle your toes and move your ankle up and down often while you are wearing it. GET HELP IF:  You have rapidly increasing bruising or puffiness (swelling).  Your toes feel very cold.  You lose feeling in your foot.  Your medicine does not help your pain. GET HELP RIGHT AWAY IF:   Your toes lose feeling (numb) or turn blue.  You have severe pain that is increasing. MAKE SURE YOU:   Understand these instructions.  Will watch your condition.  Will get help right away if you are not  doing well or get worse. Document Released: 01/09/2008 Document Revised: 12/07/2013 Document Reviewed: 02/04/2012 [facility] Patient Information 2015 [place], [place]. This information is not intended to replace advice given to you by your health care provider. Make sure you discuss any questions you have with your health care provider.

## 2014-04-11 NOTE — ED Notes (Signed)
Pt refused a wheelchair asked if she could ambulate out on her crutches.

## 2014-04-11 NOTE — ED Provider Notes (Signed)
Medical screening examination/treatment/procedure(s) were conducted as a shared visit with non-physician practitioner(s) or resident  and myself.  I personally evaluated the patient during the encounter and agree with the findings.   I have personally reviewed any xrays and/ or EKG's with the provider and I agree with interpretation.   Patient with anxiety, drug abuse, bipolar, depression, high blood pressure presents after 2 syncopal episodes at work. Patient recovers feeling lightheaded and anxious prior to each episode and patient had mild shaking if you sleepy afterwards. Patient says she's had brief seizure-like activity in the past with stress. Patient is on seizure medicines. Patient denies new medications. Patient used cocaine today. 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 Patient denies recent surgery, blood clot history, unilateral leg swelling. Patient does have leg pain however was after a fall in her right ankle hurts with range of motion. On exam patient is hyperventilating, anxious, tachycardic, blood pressure is elevated, abdomen soft nontender, mild dry mucous membranes, lungs clear, right ankle anterior and lateral tenderness with mild swelling and pain with all range of motion. Patient's father is at the bedside assisting, patient's had multiple issues in the past and she lives with her father.  Syncope, seizure-like activity, drug abuse, anxiety  Enid Skeens, MD 04/12/14 [phone]

## 2014-04-11 NOTE — ED Provider Notes (Signed)
Medical screening examination/treatment/procedure(s) were performed by non-physician practitioner and as supervising physician I was immediately available for consultation/collaboration.   EKG Interpretation   Date/Time:  Saturday April 10 2014 21:20:39 EDT Ventricular Rate:  124 PR Interval:  125 QRS Duration: 101 QT Interval:  336 QTC Calculation: 483 R Axis:   78 Text Interpretation:  Sinus tachycardia Paired ventricular premature  complexes Borderline repolarization abnormality Borderline prolonged QT  interval Confirmed by Kailah Pennel,  DO, Emmarae Cowdery [phone]) on 04/10/2014 9:46:47 PM        Layla Maw Shonte Soderlund, DO 04/11/14 1923

## 2014-08-06 ENCOUNTER — Encounter (HOSPITAL_COMMUNITY): Payer: Self-pay | Admitting: Emergency Medicine

## 2014-08-06 ENCOUNTER — Emergency Department (HOSPITAL_COMMUNITY)
Admission: EM | Admit: 2014-08-06 | Discharge: 2014-08-06 | Disposition: A | Discharge status: Home / Self Care | Payer: Medicaid Other | Attending: Emergency Medicine | Admitting: Emergency Medicine

## 2014-08-06 DIAGNOSIS — Y9289 Other specified places as the place of occurrence of the external cause: Secondary | ICD-10-CM | POA: Diagnosis not present

## 2014-08-06 DIAGNOSIS — Z79899 Other long term (current) drug therapy: Secondary | ICD-10-CM | POA: Diagnosis not present

## 2014-08-06 DIAGNOSIS — I10 Essential (primary) hypertension: Secondary | ICD-10-CM | POA: Insufficient documentation

## 2014-08-06 DIAGNOSIS — Y9389 Activity, other specified: Secondary | ICD-10-CM | POA: Insufficient documentation

## 2014-08-06 DIAGNOSIS — S81852A Open bite, left lower leg, initial encounter: Secondary | ICD-10-CM | POA: Diagnosis present

## 2014-08-06 DIAGNOSIS — Z720 Tobacco use: Secondary | ICD-10-CM | POA: Insufficient documentation

## 2014-08-06 DIAGNOSIS — F419 Anxiety disorder, unspecified: Secondary | ICD-10-CM | POA: Insufficient documentation

## 2014-08-06 DIAGNOSIS — F329 Major depressive disorder, single episode, unspecified: Secondary | ICD-10-CM | POA: Insufficient documentation

## 2014-08-06 DIAGNOSIS — W540XXA Bitten by dog, initial encounter: Secondary | ICD-10-CM | POA: Insufficient documentation

## 2014-08-06 DIAGNOSIS — Z23 Encounter for immunization: Secondary | ICD-10-CM | POA: Diagnosis not present

## 2014-08-06 DIAGNOSIS — Z91040 Latex allergy status: Secondary | ICD-10-CM | POA: Insufficient documentation

## 2014-08-06 DIAGNOSIS — Y998 Other external cause status: Secondary | ICD-10-CM | POA: Insufficient documentation

## 2014-08-06 DIAGNOSIS — T148XXA Other injury of unspecified body region, initial encounter: Secondary | ICD-10-CM

## 2014-08-06 MED ORDER — AMOXICILLIN-POT CLAVULANATE 875-125 MG PO TABS
1.0000 | ORAL_TABLET | Freq: Once | ORAL | Status: AC
  Administered 2014-08-06: 1 via ORAL
  Filled 2014-08-06: qty 1

## 2014-08-06 MED ORDER — AMOXICILLIN-POT CLAVULANATE 875-125 MG PO TABS: 1.0000 | ORAL_TABLET | Freq: Two times a day (BID) | ORAL | Status: DC

## 2014-08-06 MED ORDER — KETOROLAC TROMETHAMINE 30 MG/ML IJ SOLN
30.0000 mg | Freq: Once | INTRAMUSCULAR | Status: AC
  Administered 2014-08-06: 30 mg via INTRAMUSCULAR

## 2014-08-06 MED ORDER — IBUPROFEN 200 MG PO TABS: 800.0000 mg | ORAL_TABLET | Freq: Three times a day (TID) | ORAL | Status: AC

## 2014-08-06 MED ORDER — HYDROCODONE-ACETAMINOPHEN 5-325 MG PO TABS
1.0000 | ORAL_TABLET | Freq: Once | ORAL | Status: AC
Start: 2014-08-06 — End: 2014-08-06
  Administered 2014-08-06: 1 via ORAL
  Filled 2014-08-06: qty 1

## 2014-08-06 MED ORDER — TETANUS-DIPHTH-ACELL PERTUSSIS 5-2.5-18.5 LF-MCG/0.5 IM SUSP
0.5000 mL | Freq: Once | INTRAMUSCULAR | Status: AC
  Administered 2014-08-06: 0.5 mL via INTRAMUSCULAR
  Filled 2014-08-06: qty 0.5

## 2014-08-06 MED ORDER — KETOROLAC TROMETHAMINE 30 MG/ML IJ SOLN
30.0000 mg | Freq: Once | INTRAMUSCULAR | Status: DC
  Filled 2014-08-06: qty 1

## 2014-08-06 MED ORDER — HYDROCODONE-ACETAMINOPHEN 5-325 MG PO TABS: 1.0000 | ORAL_TABLET | ORAL | Status: DC | PRN

## 2014-08-06 NOTE — ED Notes (Signed)
Dr. Jeraldine Loots at bedside explaining discharge instructions.  Family at bedside.

## 2014-08-06 NOTE — ED Notes (Signed)
Pt. presents with bite marks at left lower calf , pt. stated she was bitten by her dog this morning ( dog's immunizations complete ) , minimal bleeding with mild swelling .

## 2014-08-06 NOTE — ED Provider Notes (Signed)
CSN: [id number]     Arrival date & time 08/06/14  0413 History   First MD Initiated Contact with Patient 08/06/14 [phone]     Chief Complaint  Patient presents with  . Animal Bite     HPI  Patient presents after sustaining a bite to her left posterior calf. Patient was bitten by her own dog, a pit bull. She suffered one bite to the posterior calf. Dog is rabies negative. No other injuries. Since the event there has been severe, nonradiating pain in the posterior of the calf.  No loss of capacity to flex or extend the foot.  No loss of sensation in the foot. No substantial active bleeding. No relief with anything. Pain is worse with palpation or motion.   Past Medical History  Diagnosis Date  . Anxiety   . Bipolar 1 disorder   . Depression   . Hypertension    Past Surgical History  Procedure Laterality Date  . Tubal ligation     No family history on file. History  Substance Use Topics  . Smoking status: Current Every Day Smoker -- 1.00 packs/day    Types: Cigarettes  . Smokeless tobacco: Never Used  . Alcohol Use: Yes     Comment: twisted tea and a few beers everyday   OB History    No data available     Review of Systems  All other systems reviewed and are negative.     Allergies  Latex  Home Medications   Prior to Admission medications   Medication Sig Start Date End Date Taking? Authorizing Provider  ARIPiprazole (ABILIFY) 2 MG tablet Take 2 mg by mouth daily.   Yes Historical Provider, MD  gabapentin (NEURONTIN) 800 MG tablet Take 800 mg by mouth 3 (three) times daily.   Yes Historical Provider, MD  ibuprofen (ADVIL,MOTRIN) 200 MG tablet Take 800 mg by mouth every 6 (six) hours as needed for pain.   Yes Historical Provider, MD  venlafaxine XR (EFFEXOR-XR) 150 MG 24 hr capsule Take 150 mg by mouth daily. Take with  capsule for a total of .   Yes Historical Provider, MD  venlafaxine XR (EFFEXOR-XR) 75 MG 24 hr capsule Take 75 mg by mouth daily with  breakfast. Take with  capsule for a total of .   Yes Historical Provider, MD   BP 119/61 mmHg  Pulse 91  Temp(Src) 98 F (36.7 C) (Oral)  Resp 20  Ht  (1.727 m)  Wt 178 lb (80.74 kg)  BMI 27.07 kg/m2  SpO2 98%  LMP 08/04/2014 Physical Exam  Constitutional: She is oriented to person, place, and time. She appears well-developed and well-nourished. No distress.  HENT:  Head: Normocephalic and atraumatic.  Eyes: Conjunctivae and EOM are normal.  Cardiovascular: Normal rate and regular rhythm.   Pulmonary/Chest: Effort normal and breath sounds normal. No stridor. No respiratory distress.  Abdominal: She exhibits no distension.  Musculoskeletal: She exhibits no edema.       Legs: Neurological: She is alert and oriented to person, place, and time. She displays no atrophy and no tremor. No cranial nerve deficit. She exhibits normal muscle tone. She displays no seizure activity.  Full rom of ankle and foot w normal sensation.  Skin: Skin is warm and dry.  Psychiatric: She has a normal mood and affect.  Nursing note and vitals reviewed.   ED Course  Procedures (including critical care time) After the initial evaluation the patient had wound care provided by nursing staff.  Patient improved with analgesia provided here.  MDM   Present patient presents after sustaining a dog bite to her left calf. No evidence for compartment syndrome nor neurovascular compromise.  Patient improved here with analgesia, was discharged with a course of antibiotics, to follow-up with primary care.    Gerhard Munch, MD 08/06/14 [phone]

## 2014-08-06 NOTE — Discharge Instructions (Signed)
As discussed, it is very important that you monitor your condition carefully, and do not hesitate to return here if you develop new, or concerning changes in your condition.  Please take all medication as directed.   Animal Bite An animal bite can result in a scratch on the skin, deep open cut, puncture of the skin, crush injury, or tearing away of the skin or a body part. Dogs are responsible for most animal bites. Children are bitten more often than adults. An animal bite can range from very mild to more serious. A small bite from your house pet is no cause for alarm. However, some animal bites can become infected or injure a bone or other tissue. You must seek medical care if:  The skin is broken and bleeding does not slow down or stop after 15 minutes.  The puncture is deep and difficult to clean (such as a cat bite).  Pain, warmth, redness, or pus develops around the wound.  The bite is from a stray animal or rodent. There may be a risk of rabies infection.  The bite is from a snake, raccoon, skunk, fox, coyote, or bat. There may be a risk of rabies infection.  The person bitten has a chronic illness such as diabetes, liver disease, or cancer, or the person takes medicine that lowers the immune system.  There is concern about the location and severity of the bite. It is important to clean and protect an animal bite wound right away to prevent infection. Follow these steps:  Clean the wound with plenty of water and soap.  Apply an antibiotic cream.  Apply gentle pressure over the wound with a clean towel or gauze to slow or stop bleeding.  Elevate the affected area above the heart to help stop any bleeding.  Seek medical care. Getting medical care within 8 hours of the animal bite leads to the best possible outcome. DIAGNOSIS  Your caregiver will most likely:  Take a detailed history of the animal and the bite injury.  Perform a wound exam.  Take your medical  history. Blood tests or X-rays may be performed. Sometimes, infected bite wounds are cultured and sent to a lab to identify the infectious bacteria.  TREATMENT  Medical treatment will depend on the location and type of animal bite as well as the patient's medical history. Treatment may include:  Wound care, such as cleaning and flushing the wound with saline solution, bandaging, and elevating the affected area.  Antibiotics.  Tetanus immunization.  Rabies immunization.  Leaving the wound open to heal. This is often done with animal bites, due to the high risk of infection. However, in certain cases, wound closure with stitches, wound adhesive, skin adhesive strips, or staples may be used. Infected bites that are left untreated may require intravenous (IV) antibiotics and surgical treatment in the hospital. HOME CARE INSTRUCTIONS  Follow your caregiver's instructions for wound care.  Take all medicines as directed.  If your caregiver prescribes antibiotics, take them as directed. Finish them even if you start to feel better.  Follow up with your caregiver for further exams or immunizations as directed. You may need a tetanus shot if:  You cannot remember when you had your last tetanus shot.  You have never had a tetanus shot.  The injury broke your skin. If you get a tetanus shot, your arm may swell, get red, and feel warm to the touch. This is common and not a problem. If you need a tetanus  shot and you choose not to have one, there is a rare chance of getting tetanus. Sickness from tetanus can be serious. SEEK MEDICAL CARE IF:  You notice warmth, redness, soreness, swelling, pus discharge, or a bad smell coming from the wound.  You have a red line on the skin coming from the wound.  You have a fever, chills, or a general ill feeling.  You have nausea or vomiting.  You have continued or worsening pain.  You have trouble moving the injured part.  You have other questions  or concerns. MAKE SURE YOU:  Understand these instructions.  Will watch your condition.  Will get help right away if you are not doing well or get worse. Document Released: 04/10/2011 Document Revised: 10/15/2011 Document Reviewed: 04/10/2011 [facility] Patient Information 2015 [place], [place]. This information is not intended to replace advice given to you by your health care provider. Make sure you discuss any questions you have with your health care provider.

## 2014-08-29 ENCOUNTER — Emergency Department (HOSPITAL_COMMUNITY): Payer: Medicaid Other

## 2014-08-29 ENCOUNTER — Inpatient Hospital Stay (HOSPITAL_COMMUNITY)
Admission: EM | Admit: 2014-08-29 | Discharge: 2014-09-01 | DRG: 603 | Disposition: A | Discharge status: Home / Self Care | Payer: Medicaid Other | Attending: Internal Medicine | Admitting: Internal Medicine

## 2014-08-29 ENCOUNTER — Encounter (HOSPITAL_COMMUNITY): Payer: Self-pay | Admitting: Cardiology

## 2014-08-29 DIAGNOSIS — R110 Nausea: Secondary | ICD-10-CM | POA: Diagnosis present

## 2014-08-29 DIAGNOSIS — L03114 Cellulitis of left upper limb: Secondary | ICD-10-CM | POA: Diagnosis present

## 2014-08-29 DIAGNOSIS — L0390 Cellulitis, unspecified: Secondary | ICD-10-CM | POA: Diagnosis present

## 2014-08-29 DIAGNOSIS — F418 Other specified anxiety disorders: Secondary | ICD-10-CM | POA: Diagnosis present

## 2014-08-29 DIAGNOSIS — F419 Anxiety disorder, unspecified: Secondary | ICD-10-CM

## 2014-08-29 DIAGNOSIS — F319 Bipolar disorder, unspecified: Secondary | ICD-10-CM | POA: Diagnosis present

## 2014-08-29 DIAGNOSIS — F17210 Nicotine dependence, cigarettes, uncomplicated: Secondary | ICD-10-CM | POA: Diagnosis present

## 2014-08-29 DIAGNOSIS — L02414 Cutaneous abscess of left upper limb: Secondary | ICD-10-CM | POA: Diagnosis present

## 2014-08-29 DIAGNOSIS — L0291 Cutaneous abscess, unspecified: Secondary | ICD-10-CM | POA: Diagnosis present

## 2014-08-29 DIAGNOSIS — I10 Essential (primary) hypertension: Secondary | ICD-10-CM | POA: Diagnosis present

## 2014-08-29 DIAGNOSIS — B9689 Other specified bacterial agents as the cause of diseases classified elsewhere: Secondary | ICD-10-CM | POA: Diagnosis present

## 2014-08-29 DIAGNOSIS — F329 Major depressive disorder, single episode, unspecified: Secondary | ICD-10-CM

## 2014-08-29 DIAGNOSIS — Z79899 Other long term (current) drug therapy: Secondary | ICD-10-CM | POA: Diagnosis not present

## 2014-08-29 LAB — URINE MICROSCOPIC-ADD ON

## 2014-08-29 LAB — CBC WITH DIFFERENTIAL/PLATELET
Basophils Absolute: 0 10*3/uL (ref 0.0–0.1)
Basophils Relative: 0 % (ref 0–1)
Eosinophils Absolute: 0 10*3/uL (ref 0.0–0.7)
Eosinophils Relative: 0 % (ref 0–5)
HCT: 42.4 % (ref 36.0–46.0)
Hemoglobin: 14.2 g/dL (ref 12.0–15.0)
Lymphocytes Relative: 17 % (ref 12–46)
Lymphs Abs: 1.8 10*3/uL (ref 0.7–4.0)
MCH: 30.4 pg (ref 26.0–34.0)
MCHC: 33.5 g/dL (ref 30.0–36.0)
MCV: 90.8 fL (ref 78.0–100.0)
Monocytes Absolute: 0.9 10*3/uL (ref 0.1–1.0)
Monocytes Relative: 9 % (ref 3–12)
Neutro Abs: 8 10*3/uL — ABNORMAL HIGH (ref 1.7–7.7)
Neutrophils Relative %: 74 % (ref 43–77)
Platelets: 256 10*3/uL (ref 150–400)
RBC: 4.67 MIL/uL (ref 3.87–5.11)
RDW: 14.6 % (ref 11.5–15.5)
WBC: 10.8 10*3/uL — ABNORMAL HIGH (ref 4.0–10.5)

## 2014-08-29 LAB — COMPREHENSIVE METABOLIC PANEL
ALT: 18 U/L (ref 0–35)
AST: 20 U/L (ref 0–37)
Albumin: 3.5 g/dL (ref 3.5–5.2)
Alkaline Phosphatase: 108 U/L (ref 39–117)
Anion gap: 9 (ref 5–15)
BUN: 11 mg/dL (ref 6–23)
CO2: 25 mmol/L (ref 19–32)
Calcium: 8.9 mg/dL (ref 8.4–10.5)
Chloride: 103 mmol/L (ref 96–112)
Creatinine, Ser: 0.79 mg/dL (ref 0.50–1.10)
GFR calc Af Amer: >90 mL/min (ref >90)
GFR calc non Af Amer: >90 mL/min (ref >90)
Glucose, Bld: 87 mg/dL (ref 70–99)
Potassium: 3.8 mmol/L (ref 3.5–5.1)
Sodium: 137 mmol/L (ref 135–145)
Total Bilirubin: 0.4 mg/dL (ref 0.3–1.2)
Total Protein: 7.3 g/dL (ref 6.0–8.3)

## 2014-08-29 LAB — GRAM STAIN: Special Requests: NORMAL

## 2014-08-29 LAB — URINALYSIS, ROUTINE W REFLEX MICROSCOPIC
Bilirubin Urine: NEGATIVE
Glucose, UA: NEGATIVE mg/dL
Hgb urine dipstick: NEGATIVE
Ketones, ur: NEGATIVE mg/dL
Nitrite: NEGATIVE
Protein, ur: NEGATIVE mg/dL
Specific Gravity, Urine: 1.021 (ref 1.005–1.030)
Urobilinogen, UA: 0.2 mg/dL (ref 0.0–1.0)
pH: 6 (ref 5.0–8.0)

## 2014-08-29 LAB — RAPID URINE DRUG SCREEN, HOSP PERFORMED
Amphetamines: NOT DETECTED
Barbiturates: NOT DETECTED
Benzodiazepines: NOT DETECTED
Cocaine: NOT DETECTED
Opiates: NOT DETECTED
Tetrahydrocannabinol: NOT DETECTED

## 2014-08-29 LAB — SURGICAL PCR SCREEN
MRSA, PCR: POSITIVE — AB
Staphylococcus aureus: POSITIVE — AB

## 2014-08-29 LAB — PREGNANCY, URINE: Preg Test, Ur: NEGATIVE

## 2014-08-29 MED ORDER — CHLORHEXIDINE GLUCONATE CLOTH 2 % EX PADS
6.0000 | MEDICATED_PAD | Freq: Every day | CUTANEOUS | Status: DC
  Administered 2014-08-30 – 2014-08-31 (×2): 6 via TOPICAL

## 2014-08-29 MED ORDER — CEFTRIAXONE SODIUM 1 G IJ SOLR
1.0000 g | Freq: Once | INTRAMUSCULAR | Status: AC
  Administered 2014-08-29: 1 g via INTRAVENOUS
  Filled 2014-08-29: qty 10

## 2014-08-29 MED ORDER — HEPARIN SODIUM (PORCINE) 5000 UNIT/ML IJ SOLN
5000.0000 [IU] | Freq: Three times a day (TID) | INTRAMUSCULAR | Status: DC
  Administered 2014-08-29 – 2014-09-01 (×9): 5000 [IU] via SUBCUTANEOUS
  Filled 2014-08-29 (×13): qty 1

## 2014-08-29 MED ORDER — GABAPENTIN 400 MG PO CAPS
800.0000 mg | ORAL_CAPSULE | Freq: Three times a day (TID) | ORAL | Status: DC
  Administered 2014-08-29 – 2014-09-01 (×8): 800 mg via ORAL
  Filled 2014-08-29 (×10): qty 2

## 2014-08-29 MED ORDER — ARIPIPRAZOLE 2 MG PO TABS
2.0000 mg | ORAL_TABLET | Freq: Every day | ORAL | Status: DC
  Administered 2014-08-30 – 2014-09-01 (×3): 2 mg via ORAL
  Filled 2014-08-29 (×3): qty 1

## 2014-08-29 MED ORDER — SODIUM CHLORIDE 0.9 % IV SOLN
INTRAVENOUS | Status: AC
  Administered 2014-08-29 – 2014-08-30 (×2): via INTRAVENOUS

## 2014-08-29 MED ORDER — HYDROCODONE-ACETAMINOPHEN 5-325 MG PO TABS
1.0000 | ORAL_TABLET | ORAL | Status: DC | PRN
  Administered 2014-08-29 – 2014-08-31 (×10): 2 via ORAL
  Filled 2014-08-29 (×10): qty 2

## 2014-08-29 MED ORDER — OXYCODONE-ACETAMINOPHEN 5-325 MG PO TABS
1.0000 | ORAL_TABLET | Freq: Once | ORAL | Status: AC
  Administered 2014-08-30: 1 via ORAL
  Filled 2014-08-29: qty 1

## 2014-08-29 MED ORDER — MUPIROCIN 2 % EX OINT
1.0000 "application " | TOPICAL_OINTMENT | Freq: Two times a day (BID) | CUTANEOUS | Status: DC
  Administered 2014-08-30 – 2014-09-01 (×6): 1 via NASAL
  Filled 2014-08-29: qty 22

## 2014-08-29 MED ORDER — VENLAFAXINE HCL ER 150 MG PO CP24
225.0000 mg | ORAL_CAPSULE | Freq: Every day | ORAL | Status: DC
  Administered 2014-08-30 – 2014-09-01 (×3): 225 mg via ORAL
  Filled 2014-08-29 (×4): qty 1

## 2014-08-29 MED ORDER — PROMETHAZINE HCL 25 MG PO TABS
12.5000 mg | ORAL_TABLET | Freq: Four times a day (QID) | ORAL | Status: DC | PRN
  Administered 2014-08-29: 12.5 mg via ORAL
  Filled 2014-08-29: qty 1

## 2014-08-29 MED ORDER — GABAPENTIN 800 MG PO TABS
800.0000 mg | ORAL_TABLET | Freq: Three times a day (TID) | ORAL | Status: DC
  Administered 2014-08-29: 800 mg via ORAL
  Filled 2014-08-29 (×2): qty 1

## 2014-08-29 MED ORDER — VANCOMYCIN HCL IN DEXTROSE 1-5 GM/200ML-% IV SOLN
1000.0000 mg | Freq: Three times a day (TID) | INTRAVENOUS | Status: DC
  Administered 2014-08-29 – 2014-08-30 (×4): 1000 mg via INTRAVENOUS
  Filled 2014-08-29 (×6): qty 200

## 2014-08-29 MED ORDER — VANCOMYCIN HCL IN DEXTROSE 1-5 GM/200ML-% IV SOLN
1000.0000 mg | Freq: Once | INTRAVENOUS | Status: AC
  Administered 2014-08-29: 1000 mg via INTRAVENOUS
  Filled 2014-08-29: qty 200

## 2014-08-29 MED ORDER — LIDOCAINE-EPINEPHRINE (PF) 2 %-1:200000 IJ SOLN
10.0000 mL | Freq: Once | INTRAMUSCULAR | Status: AC
  Administered 2014-08-29: 10 mL
  Filled 2014-08-29: qty 20

## 2014-08-29 NOTE — ED Provider Notes (Signed)
CSN: [id number]     Arrival date & time 08/29/14  1033 History   First MD Initiated Contact with Patient 08/29/14 1113     Chief Complaint  Patient presents with  . Abscess   Lindie SpruceChristy R Fronek is a 34 y.o. right hand dominant female with a history of substance abuse, anxiety, depression, hypertension and bipolar disorder who presents to the emergency department with an abscess to her left antecubital fossa that began 4 days ago. Patient reports a history of abscesses and has had one in her left before meals previously. Patient reports that last night she poked holes in it trying to drain it. Patient reports her pain is 10 out of 10 and worse with movement. Patient denies any IV drug use or illicit drug use currently. She reports feeling anxious about needles. The patient denies fevers, chills, numbness, tingling, weakness, vomiting, or history of MRSA.  (Consider location/radiation/quality/duration/timing/severity/associated sxs/prior Treatment) HPI  Past Medical History  Diagnosis Date  . Anxiety   . Bipolar 1 disorder   . Depression   . Hypertension    Past Surgical History  Procedure Laterality Date  . Tubal ligation     History reviewed. No pertinent family history. History  Substance Use Topics  . Smoking status: Current Every Day Smoker -- 1.00 packs/day    Types: Cigarettes  . Smokeless tobacco: Never Used  . Alcohol Use: Yes     Comment: twisted tea and a few beers everyday   OB History    No data available     Review of Systems  Constitutional: Negative for fever and chills.  Eyes: Negative for visual disturbance.  Respiratory: Negative for cough and shortness of breath.   Cardiovascular: Negative for chest pain and palpitations.  Gastrointestinal: Negative for nausea, vomiting, abdominal pain and diarrhea.  Genitourinary: Negative for dysuria.  Skin: Positive for color change and rash.       Abscess in left ac  Neurological: Negative for numbness and headaches.    Psychiatric/Behavioral: The patient is nervous/anxious.       Allergies  Latex  Home Medications   Prior to Admission medications   Medication Sig Start Date End Date Taking? Authorizing Provider  ARIPiprazole (ABILIFY) 2 MG tablet Take 2 mg by mouth daily.   Yes Historical Provider, MD  gabapentin (NEURONTIN) 800 MG tablet Take 800 mg by mouth 3 (three) times daily.   Yes Historical Provider, MD  venlafaxine XR (EFFEXOR-XR) 150 MG 24 hr capsule Take 150 mg by mouth daily. Take with 75mg  capsule for a total of 225mg .   Yes Historical Provider, MD  venlafaxine XR (EFFEXOR-XR) 75 MG 24 hr capsule Take 75 mg by mouth daily with breakfast. Take with 150mg  capsule for a total of 225mg .   Yes Historical Provider, MD  amoxicillin-clavulanate (AUGMENTIN) 875-125 MG per tablet Take 1 tablet by mouth 2 (two) times daily. Patient not taking: Reported on 08/29/2014 08/06/14   Gerhard Munchobert Lockwood, MD  HYDROcodone-acetaminophen (NORCO/VICODIN) 5-325 MG per tablet Take 1 tablet by mouth every 4 (four) hours as needed for moderate pain. Patient not taking: Reported on 08/29/2014 08/06/14   Gerhard Munchobert Lockwood, MD   BP 132/83 mmHg  Pulse 99  Temp(Src) 98 F (36.7 C) (Oral)  Resp 18  SpO2 98%  LMP 08/04/2014 Physical Exam  Constitutional: She appears well-developed and well-nourished. No distress.  HENT:  Head: Normocephalic and atraumatic.  Mouth/Throat: Oropharynx is clear and moist. No oropharyngeal exudate.  Eyes: Pupils are equal, round, and reactive to  light. Right eye exhibits no discharge. Left eye exhibits no discharge.  Cardiovascular: Normal rate, regular rhythm, normal heart sounds and intact distal pulses.  Exam reveals no gallop and no friction rub.   No murmur heard. Pulmonary/Chest: Effort normal and breath sounds normal. No respiratory distress.  Musculoskeletal: Normal range of motion.  Full range of motion of her left elbow with pain.   Lymphadenopathy:    She has no axillary adenopathy.   No left axillary adenopathy  Neurological: She is alert. Coordination normal.  Skin: She is not diaphoretic.  Abscess to her left antecubital fossa with surrounding induration and overlying erythema.   Psychiatric: She has a normal mood and affect. Her behavior is normal.          ED Course  Procedures (including critical care time) Labs Review Labs Reviewed  CBC WITH DIFFERENTIAL/PLATELET - Abnormal; Notable for the following:    WBC 10.8 (*)    Neutro Abs 8.0 (*)    All other components within normal limits  URINALYSIS, ROUTINE W REFLEX MICROSCOPIC - Abnormal; Notable for the following:    APPearance CLOUDY (*)    Leukocytes, UA TRACE (*)    All other components within normal limits  URINE MICROSCOPIC-ADD ON - Abnormal; Notable for the following:    Squamous Epithelial / LPF FEW (*)    All other components within normal limits  CULTURE, BLOOD (ROUTINE X 2)  CULTURE, BLOOD (ROUTINE X 2)  GRAM STAIN  CULTURE, ROUTINE-ABSCESS  COMPREHENSIVE METABOLIC PANEL  URINE RAPID DRUG SCREEN (HOSP PERFORMED)  PREGNANCY, URINE    Imaging Review Dg Elbow Complete Left  08/29/2014   CLINICAL DATA:  Left antecubital abscess with attempted drainage. Persistent Pain and swelling. History of drug abuse. Initial encounter.  EXAM: LEFT ELBOW - COMPLETE 3+ VIEW  COMPARISON:  MRI 02/16/2014.  FINDINGS: The mineralization and alignment are normal. There is no evidence of acute fracture or dislocation. The joint spaces are maintained. The anterior fat pad is visible, not significantly uplifted. No visible posterior fat pad identified. There is soft tissue swelling surrounding the elbow, especially anteriorly in the antecubital fossa. No foreign body or soft tissue emphysema demonstrated.  IMPRESSION: 1. Soft tissue swelling around the elbow, especially anteriorly. If there is concern of an abscess, follow up CT or MRI with contrast is suggested. 2. No acute osseous findings or evidence of  osteomyelitis.   Electronically Signed   By: Roxy Horseman M.D.   On: 08/29/2014 12:55     EKG Interpretation None      Filed Vitals:   08/29/14 1130 08/29/14 1215 08/29/14 1328 08/29/14 1447  BP: 136/77 128/79 119/63 132/83  Pulse: 85 88 97 99  Temp:      TempSrc:      Resp:    18  SpO2: 100% 95% 98% 98%     MDM   Meds given in ED:  Medications  lidocaine-EPINEPHrine (XYLOCAINE W/EPI) 2 %-1:200000 (PF) injection 10 mL (10 mLs Infiltration Given 08/29/14 1144)  cefTRIAXone (ROCEPHIN) 1 g in dextrose 5 % 50 mL IVPB (0 g Intravenous Stopped 08/29/14 1327)  vancomycin (VANCOCIN) IVPB 1000 mg/200 mL premix (0 mg Intravenous Stopped 08/29/14 1453)    New Prescriptions   No medications on file    Final diagnoses:  Cellulitis and abscess   This is a 34 year old female who presented and was going complaining of an abscess in her left antecubital fossa for the past 4 days. Patient has a history of previous arm  and hand infections requiring IV antibiotics. Patient denies any IV drug use. Patient has an abscess with a large amount of induration surrounding her antecubital fossa. Patient has pain with movement of her left elbow. Patient's white count is 10.8. Patient negative urine drug screen. Urinalysis and CMP is unremarkable. There is no acute osseous abnormality or evidence of osteomyelitis on x-ray. Patient given Rocephin and vancomycin in the ED. Hand surgeon Dr. Mina Marble consulted. Prior to consult the abscess burst on its own. Dr. Mina Marble assessed the patient and would like her to be admitted to medicine and he will see her again tomorrow. Patient was accepted for admission by Dr. Yetta Barre from internal medicine teaching service. Patient is in agreement with admission.   This patient was discussed with and evaluated by Dr. Donnald Garre who agrees with assessment and plan.      Lawana Chambers, PA-C 08/29/14 1605  Arby Barrette, MD 09/02/14 [phone]

## 2014-08-29 NOTE — Consult Note (Signed)
Reason for Consult:left elbow drainage Referring Physician: phieffer  [name] is an 34 y.o. female.  HPI: with h/o left arm infections in the past with drainage and swelling in left elbow  Past Medical History  Diagnosis Date  . Anxiety   . Bipolar 1 disorder   . Depression   . Hypertension     Past Surgical History  Procedure Laterality Date  . Tubal ligation      History reviewed. No pertinent family history.  Social History:  reports that she has been smoking Cigarettes.  She has been smoking about 1.00 pack per day. She has never used smokeless tobacco. She reports that she drinks alcohol. She reports that she uses illicit drugs (Cocaine and Marijuana).  Allergies:  Allergies  Allergen Reactions  . Latex Itching    gloves    Medications: Scheduled:  Results for orders placed or performed during the hospital encounter of 08/29/14 (from the past 48 hour(s))  Comprehensive metabolic panel     Status: None   Collection Time: 08/29/14 11:00 AM  Result Value Ref Range   Sodium 137 135 - 145 mmol/L   Potassium 3.8 3.5 - 5.1 mmol/L   Chloride 103 96 - 112 mmol/L   CO2 25 19 - 32 mmol/L   Glucose, Bld 87 70 - 99 mg/dL   BUN 11 6 - 23 mg/dL   Creatinine, Ser 0.79 0.50 - 1.10 mg/dL   Calcium 8.9 8.4 - 10.5 mg/dL   Total Protein 7.3 6.0 - 8.3 g/dL   Albumin 3.5 3.5 - 5.2 g/dL   AST 20 0 - 37 U/L   ALT 18 0 - 35 U/L   Alkaline Phosphatase 108 39 - 117 U/L   Total Bilirubin 0.4 0.3 - 1.2 mg/dL   GFR calc non Af Amer >90 >90 mL/min   GFR calc Af Amer >90 >90 mL/min    Comment: (NOTE) The eGFR has been calculated using the CKD EPI equation. This calculation has not been validated in all clinical situations. eGFR's persistently <90 mL/min signify possible Chronic Kidney Disease.    Anion gap 9 5 - 15  CBC with Differential     Status: Abnormal   Collection Time: 08/29/14 11:00 AM  Result Value Ref Range   WBC 10.8 (H) 4.0 - 10.5 K/uL   RBC 4.67 3.87 - 5.11  MIL/uL   Hemoglobin 14.2 12.0 - 15.0 g/dL   HCT 42.4 36.0 - 46.0 %   MCV 90.8 78.0 - 100.0 fL   MCH 30.4 26.0 - 34.0 pg   MCHC 33.5 30.0 - 36.0 g/dL   RDW 14.6 11.5 - 15.5 %   Platelets 256 150 - 400 K/uL   Neutrophils Relative % 74 43 - 77 %   Neutro Abs 8.0 (H) 1.7 - 7.7 K/uL   Lymphocytes Relative 17 12 - 46 %   Lymphs Abs 1.8 0.7 - 4.0 K/uL   Monocytes Relative 9 3 - 12 %   Monocytes Absolute 0.9 0.1 - 1.0 K/uL   Eosinophils Relative 0 0 - 5 %   Eosinophils Absolute 0.0 0.0 - 0.7 K/uL   Basophils Relative 0 0 - 1 %   Basophils Absolute 0.0 0.0 - 0.1 K/uL  Urinalysis, Routine w reflex microscopic     Status: Abnormal   Collection Time: 08/29/14 11:59 AM  Result Value Ref Range   Color, Urine YELLOW YELLOW   APPearance CLOUDY (A) CLEAR   Specific Gravity, Urine 1.021 1.005 - 1.030  pH 6.0 5.0 - 8.0   Glucose, UA NEGATIVE NEGATIVE mg/dL   Hgb urine dipstick NEGATIVE NEGATIVE   Bilirubin Urine NEGATIVE NEGATIVE   Ketones, ur NEGATIVE NEGATIVE mg/dL   Protein, ur NEGATIVE NEGATIVE mg/dL   Urobilinogen, UA 0.2 0.0 - 1.0 mg/dL   Nitrite NEGATIVE NEGATIVE   Leukocytes, UA TRACE (A) NEGATIVE  Urine rapid drug screen (hosp performed)     Status: None   Collection Time: 08/29/14 11:59 AM  Result Value Ref Range   Opiates NONE DETECTED NONE DETECTED   Cocaine NONE DETECTED NONE DETECTED   Benzodiazepines NONE DETECTED NONE DETECTED   Amphetamines NONE DETECTED NONE DETECTED   Tetrahydrocannabinol NONE DETECTED NONE DETECTED   Barbiturates NONE DETECTED NONE DETECTED    Comment:        DRUG SCREEN FOR MEDICAL PURPOSES ONLY.  IF CONFIRMATION IS NEEDED FOR ANY PURPOSE, NOTIFY LAB WITHIN 5 DAYS.        LOWEST DETECTABLE LIMITS FOR URINE DRUG SCREEN Drug Class       Cutoff (ng/mL) Amphetamine      1000 Barbiturate      200 Benzodiazepine   756 Tricyclics       433 Opiates          300 Cocaine          300 THC              50   Pregnancy, urine     Status: None    Collection Time: 08/29/14 11:59 AM  Result Value Ref Range   Preg Test, Ur NEGATIVE NEGATIVE    Comment:        THE SENSITIVITY OF THIS METHODOLOGY IS >20 mIU/mL.   Urine microscopic-add on     Status: Abnormal   Collection Time: 08/29/14 11:59 AM  Result Value Ref Range   Squamous Epithelial / LPF FEW (A) RARE   WBC, UA 3-6 <3 WBC/hpf    Dg Elbow Complete Left  08/29/2014   CLINICAL DATA:  Left antecubital abscess with attempted drainage. Persistent Pain and swelling. History of drug abuse. Initial encounter.  EXAM: LEFT ELBOW - COMPLETE 3+ VIEW  COMPARISON:  MRI 02/16/2014.  FINDINGS: The mineralization and alignment are normal. There is no evidence of acute fracture or dislocation. The joint spaces are maintained. The anterior fat pad is visible, not significantly uplifted. No visible posterior fat pad identified. There is soft tissue swelling surrounding the elbow, especially anteriorly in the antecubital fossa. No foreign body or soft tissue emphysema demonstrated.  IMPRESSION: 1. Soft tissue swelling around the elbow, especially anteriorly. If there is concern of an abscess, follow up CT or MRI with contrast is suggested. 2. No acute osseous findings or evidence of osteomyelitis.   Electronically Signed   By: Camie Patience M.D.   On: 08/29/2014 12:55    Review of Systems  All other systems reviewed and are negative.  Blood pressure 119/63, pulse 97, temperature 98 F (36.7 C), temperature source Oral, resp. rate 16, last menstrual period 08/04/2014, SpO2 98 %. Physical Exam  Constitutional: She is oriented to person, place, and time. She appears well-developed and well-nourished.  HENT:  Head: Normocephalic and atraumatic.  Cardiovascular: Normal rate.   Respiratory: Effort normal.  Musculoskeletal:       Left elbow: She exhibits swelling.  Left elbow erythema and spontaneous drainage from antecubital fossa  Neurological: She is alert and oriented to person, place, and time.   Skin: Skin is warm. There is  erythema.  Psychiatric: She has a normal mood and affect. Her behavior is normal. Judgment and thought content normal.    Assessment/Plan: As above   Wound discharge cultured at bedside with STAT gram stain  Would admit to medicine for IV ABX and local wound care     Shawntina Diffee A 08/29/2014, 2:35 PM

## 2014-08-29 NOTE — ED Notes (Signed)
Pt reports abscess opened on its own. Hand surgery at the bedside. Culture obtained and sent. Dressing placed to patients arm.

## 2014-08-29 NOTE — ED Notes (Signed)
Pt requesting pain medication, Provider informed.

## 2014-08-29 NOTE — H&P (Signed)
Date: 08/29/2014               Patient Name:  [name] MRN: [id number]  DOB: Mar 31, 1981 Age / Sex: 34 y.o., female   PCP: Provider Default, MD         Medical Service: Internal Medicine Teaching Service         Attending Physician: Dr. Earl Lagos, MD    First Contact: Dr. Eleonore Chiquito Pager: [phone]  Second Contact: Dr. Lauris Chroman Pager: [phone]       After Hours (After 5p/  First Contact Pager: [phone]  weekends / holidays): Second Contact Pager: [phone]   Chief Complaint: drain  History of Present Illness: [name] is a 34 yo woman with a history of anxiety, depression and multiple hospitalizations for abscesses with cellulitis who was admitted for care of her draining, erythematous left antecubital abscess. She first noticed the redness three days ago when she was in the shower. The area started to hurt the next day, to the point where she couldn't fully extend her arm due to pain. She had a friend poke a needle into the abscess last night when the pain and pressure were 10/10 in intensity and she could tell it was fluctuant. Since then, the redness and pain have worsened, and she developed nausea. She denies chills, subjective fever, recent scratches or trauma and any history of IV drug use. She decided to come into the ED.  She actually has had abscesses on her right neck, right groin and left arm in the past. She had a similar admission in 02/2014 for cellulitic change and mild myositis in the left hand and forearm (treated with vancomycin and cefazolin --> doxycycline and keflex; she did not require surgery). She also visited the [facility] ED for a dog bite earlier this month and was treated with augmentin and a tetanus shot; she did not complete her full course of antibiotics, but her wound has healed.    Meds: No current facility-administered medications for this encounter.   Current Outpatient Prescriptions  Medication Sig Dispense Refill  .  ARIPiprazole (ABILIFY) 2 MG tablet Take 2 mg by mouth daily.    Marland Kitchen gabapentin (NEURONTIN) 800 MG tablet Take 800 mg by mouth 3 (three) times daily.    Marland Kitchen venlafaxine XR (EFFEXOR-XR) 150 MG 24 hr capsule Take 150 mg by mouth daily. Take with  capsule for a total of .    . venlafaxine XR (EFFEXOR-XR) 75 MG 24 hr capsule Take 75 mg by mouth daily with breakfast. Take with  capsule for a total of .    . amoxicillin-clavulanate (AUGMENTIN) 875-125 MG per tablet Take 1 tablet by mouth 2 (two) times daily. (Patient not taking: Reported on 08/29/2014) 14 tablet 0  . HYDROcodone-acetaminophen (NORCO/VICODIN) 5-325 MG per tablet Take 1 tablet by mouth every 4 (four) hours as needed for moderate pain. (Patient not taking: Reported on 08/29/2014) 15 tablet 0    Allergies: Allergies as of 08/29/2014 - Review Complete 08/29/2014  Allergen Reaction Noted  . Latex Itching 02/16/2014   Past Medical History  Diagnosis Date  . Anxiety   . Bipolar 1 disorder   . Depression   . Hypertension    Past Surgical History  Procedure Laterality Date  . Tubal ligation     History reviewed. No pertinent family history. History   Social History  . Marital Status: Single    Spouse Name: N/A    Number of Children: N/A  .  Years of Education: N/A   Occupational History  . Not on file.   Social History Main Topics  . Smoking status: Current Every Day Smoker -- 1.00 packs/day    Types: Cigarettes  . Smokeless tobacco: Never Used  . Alcohol Use: Yes     Comment: twisted tea and a few beers everyday  . Drug Use: Yes    Special: Cocaine, Marijuana     Comment: today  . Sexual Activity: Not on file   Other Topics Concern  . Not on file   Social History Narrative    Review of Systems: General: has been feeling well until 3 days ago when she noticed arm redness, no chills, no subjective fever Skin: pain, redness, swelling and drainage from left elbow; friend drained it with a needle last  night HEENT: no headaches, no cough Cardiac: no chest pain or palpitations Respiratory: no shortness of breath GI: + nausea, no emesis, no changes in BMs Urinary: no changes in urination Msk: no joint swelling or joint pain Psychiatric: history of anxiety and depression; has been taking medications as prescribed  Physical Exam: Blood pressure 132/83, pulse 99, temperature 98 F (36.7 C), temperature source Oral, resp. rate 18, last menstrual period 08/04/2014, SpO2 98 %. Appearance: in NAD, lying in bed with dad at bedside, appears older than stated age HEENT: AT/[place], PERRL, EOMi,  Heart: RRR, normal S1S2, no murmurs Lungs: CTAB, no wheezes Abdomen: BS+, soft, nontender, no organomegaly Musculoskeletal: normal range of motion other than left arm, which could not be fully extended at elbow due to pain Extremities: multiple tattoos (lower back, left upper arm) Neurologic: A&Ox3, grossly intact Skin: arm initially wrapped; see below for image of left antecubital fossa, which was erythematous to the mid-forearm and mid humerus, tender to touch, warm to touch, edematous, with spontaneous drainage from central pustule     Lab results: Basic Metabolic Panel:  Recent Labs  16/05/9600/24/16 1100  NA 137  K 3.8  CL 103  CO2 25  GLUCOSE 87  BUN 11  CREATININE 0.79  CALCIUM 8.9   Liver Function Tests:  Recent Labs  08/29/14 1100  AST 20  ALT 18  ALKPHOS 108  BILITOT 0.4  PROT 7.3  ALBUMIN 3.5   CBC:  Recent Labs  08/29/14 1100  WBC 10.8*  NEUTROABS 8.0*  HGB 14.2  HCT 42.4  MCV 90.8  PLT 256   Urine Drug Screen: Drugs of Abuse     Component Value Date/Time   LABOPIA NONE DETECTED 08/29/2014 1159   COCAINSCRNUR NONE DETECTED 08/29/2014 1159   LABBENZ NONE DETECTED 08/29/2014 1159   AMPHETMU NONE DETECTED 08/29/2014 1159   THCU NONE DETECTED 08/29/2014 1159   LABBARB NONE DETECTED 08/29/2014 1159    Urinalysis:  Recent Labs  08/29/14 1159  COLORURINE YELLOW    LABSPEC 1.021  PHURINE 6.0  GLUCOSEU NEGATIVE  HGBUR NEGATIVE  BILIRUBINUR NEGATIVE  KETONESUR NEGATIVE  PROTEINUR NEGATIVE  UROBILINOGEN 0.2  NITRITE NEGATIVE  LEUKOCYTESUR TRACE*   Imaging results:  Dg Elbow Complete Left  08/29/2014   CLINICAL DATA:  Left antecubital abscess with attempted drainage. Persistent Pain and swelling. History of drug abuse. Initial encounter.  EXAM: LEFT ELBOW - COMPLETE 3+ VIEW  COMPARISON:  MRI 02/16/2014.  FINDINGS: The mineralization and alignment are normal. There is no evidence of acute fracture or dislocation. The joint spaces are maintained. The anterior fat pad is visible, not significantly uplifted. No visible posterior fat pad identified. There is soft tissue  swelling surrounding the elbow, especially anteriorly in the antecubital fossa. No foreign body or soft tissue emphysema demonstrated.  IMPRESSION: 1. Soft tissue swelling around the elbow, especially anteriorly. If there is concern of an abscess, follow up CT or MRI with contrast is suggested. 2. No acute osseous findings or evidence of osteomyelitis.   Electronically Signed   By: Roxy Horseman M.D.   On: 08/29/2014 12:55    Assessment & Plan by Problem: Active Problems:   Cellulitis and abscess  Ms. Anahis Furgeson is a 34 yo woman here for treatment of her left antecubital abscess and cellulitis. Hand surgery examined the patient, took cultures and a gram stain, and recommended admission for IV antibiotics.  Left Antecubital Cellulitis and Abscess: Traced with purple pen on admission. Significant erythema, pain and drainage, but no signs of compartment syndrome. Elbow XR revealed soft tissue swelling with no evidence of osteomyelitis. Ceftriaxone and vancomycin administered in the ED.  - Continue vancomycin per pharmacy - Appreciate hand surgery following - Wound culture and gram stain pending - Blood cultures pending - A1c pending - HIV pending - Norco 1 mg q4 hours - Wound care  consult - Keep left arm elevated  Nausea: Poor PO intake over past 2 days. - Phenergan 12.5 mg 16 hours PRN - IVF NS   Anxiety and Depression: On home Effexor, Abilify and Neurontin. - Continue home Effexor 24 hr by mouth 225 mg daily - Continue home Abilify 2 mg by mouth daily - Continue home Neurontin 800 mg TID  Diet: - Regular   DVT Ppx:  - [place] heparin  Dispo: Disposition is deferred at this time, awaiting improvement of current medical problems. Anticipated discharge in approximately 1-2 day(s).   The patient does not have a current PCP (Provider Default, MD) and does not know need an [facility] hospital follow-up appointment after discharge.  The patient does not have transportation limitations that hinder transportation to clinic appointments.  Signed: Dionne Ano, MD 08/29/2014, 3:57 PM

## 2014-08-29 NOTE — ED Notes (Signed)
Attempted report x1. 

## 2014-08-29 NOTE — ED Notes (Signed)
EDP at the bedside.  ?

## 2014-08-29 NOTE — ED Notes (Signed)
Provider at the bedside.  

## 2014-08-29 NOTE — ED Notes (Signed)
Admitting at bedside 

## 2014-08-29 NOTE — Progress Notes (Signed)
ANTIBIOTIC CONSULT NOTE - INITIAL  Pharmacy Consult for Vancomycin Indication: cellulitis  Allergies  Allergen Reactions  . Latex Itching    gloves    Patient Measurements: Height: 5\' 8"  (172.7 cm) Weight: 185 lb (83.915 kg) IBW/kg (Calculated) : 63.9  Vital Signs: Temp: 98.2 F (36.8 C) (01/24 1624) Temp Source: Oral (01/24 1624) BP: 107/80 mmHg (01/24 1624) Pulse Rate: 86 (01/24 1624)  Labs:  Recent Labs  08/29/14 1100  WBC 10.8*  HGB 14.2  PLT 256  CREATININE 0.79   Estimated Creatinine Clearance: 112.5 mL/min (by C-G formula based on Cr of 0.79).  Medical History: Past Medical History  Diagnosis Date  . Anxiety   . Bipolar 1 disorder   . Depression   . Hypertension    Assessment:     34 yrs old woman with hx multiple hospitalizations for abscesses with cellulitis admitted for left antecubital abscess.   Blood and wound cultures sent.  Afebrile, WBC 10.8.  Good renal function.      Vancomycin 1 gram IV given in ED ~ 1:30pm.  Goal of Therapy:  Vancomycin trough level 15-20 mcg/ml  Plan:   Vancomycin 1 gram IV q8hrs.  Will follow renal function, culture data and progress.  Will consider checking Vanc trough level later this week.  [name], [name], [place] Pager: [phone] 08/29/2014,4:53 PM

## 2014-08-29 NOTE — ED Notes (Signed)
Pt reports that [facility] and it has continued to get worse over the past couple of days. Reports a hx of cellulitis. Attempted to drain at home without success.

## 2014-08-30 LAB — HEMOGLOBIN A1C
Hgb A1c MFr Bld: 5.3 % (ref <5.7)
Mean Plasma Glucose: 105 mg/dL (ref <117)

## 2014-08-30 LAB — CBC WITH DIFFERENTIAL/PLATELET
Basophils Absolute: 0 10*3/uL (ref 0.0–0.1)
Basophils Relative: 1 % (ref 0–1)
Eosinophils Absolute: 0.1 10*3/uL (ref 0.0–0.7)
Eosinophils Relative: 1 % (ref 0–5)
HCT: 36.9 % (ref 36.0–46.0)
Hemoglobin: 12 g/dL (ref 12.0–15.0)
Lymphocytes Relative: 43 % (ref 12–46)
Lymphs Abs: 2.5 10*3/uL (ref 0.7–4.0)
MCH: 30 pg (ref 26.0–34.0)
MCHC: 32.5 g/dL (ref 30.0–36.0)
MCV: 92.3 fL (ref 78.0–100.0)
Monocytes Absolute: 0.6 10*3/uL (ref 0.1–1.0)
Monocytes Relative: 11 % (ref 3–12)
Neutro Abs: 2.5 10*3/uL (ref 1.7–7.7)
Neutrophils Relative %: 44 % (ref 43–77)
Platelets: 221 10*3/uL (ref 150–400)
RBC: 4 MIL/uL (ref 3.87–5.11)
RDW: 14.9 % (ref 11.5–15.5)
WBC: 5.8 10*3/uL (ref 4.0–10.5)

## 2014-08-30 MED ORDER — OXYCODONE HCL 5 MG PO TABS
5.0000 mg | ORAL_TABLET | Freq: Once | ORAL | Status: AC
  Administered 2014-08-30: 5 mg via ORAL
  Filled 2014-08-30: qty 1

## 2014-08-30 MED ORDER — CEFTRIAXONE SODIUM IN DEXTROSE 20 MG/ML IV SOLN
1.0000 g | INTRAVENOUS | Status: DC
  Administered 2014-08-30 – 2014-08-31 (×2): 1 g via INTRAVENOUS
  Filled 2014-08-30 (×3): qty 50

## 2014-08-30 NOTE — Progress Notes (Signed)
Subjective: [name] feels "okay" today - she is not too happy to be in the hospital. She continues to have some pain, but believes her arm is looking better today.  Objective: Vital signs in last 24 hours: Filed Vitals:   08/29/14 1624 08/29/14 2220 08/30/14 0736 08/30/14 1430  BP: 107/80 109/63 110/65 111/60  Pulse: 86 77 80 70  Temp: 98.2 F (36.8 C) 98.2 F (36.8 C) 99.3 F (37.4 C) 98.6 F (37 C)  TempSrc: Oral Oral Oral   Resp: Height:  (1.727 m)     Weight: 185 lb (83.915 kg)     SpO2: 100% 96% 96% 97%   Weight change:   Intake/Output Summary (Last 24 hours) at 08/30/14 1605 Last data filed at 08/30/14 1300  Gross per 24 hour  Intake   1120 ml  Output      0 ml  Net   1120 ml   Physical Exam: Appearance: in NAD, lying in bed with several family members at bedside, appears older than stated age HEENT: AT/[place], PERRL, EOMi, no lymphadenopathy, no right neck abscess (patient had one there in the past) Heart: RRR, normal S1S2, no murmurs Lungs: CTAB, no wheezes Abdomen: BS+, soft, nontender, no organomegaly Musculoskeletal: normal range of motion other than left arm, which could not be fully extended at elbow due to pain Extremities: multiple tattoos (lower back, left upper arm) Neurologic: A&Ox3, grossly intact Skin: arm not currently wrapped; improved erythematous border compared to yesterday. Still tender, warm and indurated. See below for image of left antecubital fossa on admission  On admission: 1/24 erythematous to the mid-forearm and mid humerus, tender to touch, warm to touch, edematous, with spontaneous drainage from central pustule    Lab Results: Basic Metabolic Panel:  Recent Labs Lab 08/29/14 1100  NA 137  K 3.8  CL 103  CO2 25  GLUCOSE 87  BUN 11  CREATININE 0.79  CALCIUM 8.9   Liver Function Tests:  Recent Labs Lab 08/29/14 1100  AST 20  ALT 18  ALKPHOS 108  BILITOT 0.4  PROT 7.3  ALBUMIN 3.5    CBC:  Recent Labs Lab 08/29/14 1100 08/30/14 0523  WBC 10.8* 5.8  NEUTROABS 8.0* 2.5  HGB 14.2 12.0  HCT 42.4 36.9  MCV 90.8 92.3  PLT 256 221   Hemoglobin A1C:  Recent Labs Lab 08/29/14 1810  HGBA1C 5.3   Urine Drug Screen: Drugs of Abuse     Component Value Date/Time   LABOPIA NONE DETECTED 08/29/2014 1159   COCAINSCRNUR NONE DETECTED 08/29/2014 1159   LABBENZ NONE DETECTED 08/29/2014 1159   AMPHETMU NONE DETECTED 08/29/2014 1159   THCU NONE DETECTED 08/29/2014 1159   LABBARB NONE DETECTED 08/29/2014 1159    Urinalysis:  Recent Labs Lab 08/29/14 1159  COLORURINE YELLOW  LABSPEC 1.021  PHURINE 6.0  GLUCOSEU NEGATIVE  HGBUR NEGATIVE  BILIRUBINUR NEGATIVE  KETONESUR NEGATIVE  PROTEINUR NEGATIVE  UROBILINOGEN 0.2  NITRITE NEGATIVE  LEUKOCYTESUR TRACE*    Micro Results: Recent Results (from the past 240 hour(s))  Culture, blood (routine x 2)     Status: None (Preliminary result)   Collection Time: 08/29/14 12:20 PM  Result Value Ref Range Status   Specimen Description BLOOD RIGHT ANTECUBITAL  Final   Special Requests   Final    BOTTLES DRAWN AEROBIC AND ANAEROBIC 10CC AER 4CC ANA   Culture   Final           BLOOD  CULTURE RECEIVED NO GROWTH TO DATE CULTURE WILL BE HELD FOR 5 DAYS BEFORE ISSUING A FINAL NEGATIVE REPORT [facility]formed [facility]    Report Status PENDING  Incomplete  Culture, blood (routine x 2)     Status: None (Preliminary result)   Collection Time: 08/29/14 12:26 PM  Result Value Ref Range Status   Specimen Description BLOOD RIGHT HAND  Final   Special Requests BOTTLES DRAWN AEROBIC ONLY 10CC  Final   Culture   Final           BLOOD CULTURE RECEIVED NO GROWTH TO DATE CULTURE WILL BE HELD FOR 5 DAYS BEFORE ISSUING A FINAL NEGATIVE REPORT Performed at [facility]    Report Status PENDING  Incomplete  Gram stain     Status: None   Collection Time: 08/29/14  2:16 PM  Result Value Ref Range Status   Specimen  Description ABSCESS LEFT ARM  Final   Special Requests Normal  Final   Gram Stain   Final    RARE WBC NO SQUAMOUS EPITHELIAL CELLS SEEN FEW GRAM NEGATIVE COCCOBACILLI Performed at [facility]    Report Status 08/29/2014 FINAL  Final  Culture, routine-abscess     Status: None (Preliminary result)   Collection Time: 08/29/14  2:16 PM  Result Value Ref Range Status   Specimen Description ABSCESS LEFT ARM  Final   Special Requests NONE  Final   Gram Stain   Final    RARE WBC NO SQUAMOUS EPITHELIAL CELLS SEEN FEW GRAM NEGATIVE COCCOBACILLI Performed at [facility]    Culture   Final    NO GROWTH 1 DAY Performed at [facility]    Report Status PENDING  Incomplete  Surgical pcr screen     Status: Abnormal   Collection Time: 08/29/14  9:24 PM  Result Value Ref Range Status   MRSA, PCR POSITIVE (A) NEGATIVE Final    Comment: RESULT CALLED TO, READ BACK BY AND VERIFIED WITH: R.LIGHT,RN 2305 06/30/15 M.CAMPBELL    Staphylococcus aureus POSITIVE (A) NEGATIVE Final    Comment:        The Xpert SA Assay (FDA approved for NASAL specimens in patients over 35 years of age), is one component of a comprehensive surveillance program.  Test performance has been validated by [facility] for patients greater than or equal to 664 year old. It is not intended to diagnose infection nor to guide or monitor treatment.    Studies/Results: Dg Elbow Complete Left  08/29/2014   CLINICAL DATA:  Left antecubital abscess with attempted drainage. Persistent Pain and swelling. History of drug abuse. Initial encounter.  EXAM: LEFT ELBOW - COMPLETE 3+ VIEW  COMPARISON:  MRI 02/16/2014.  FINDINGS: The mineralization and alignment are normal. There is no evidence of acute fracture or dislocation. The joint spaces are maintained. The anterior fat pad is visible, not significantly uplifted. No visible posterior fat pad identified. There is soft tissue swelling surrounding the elbow,  especially anteriorly in the antecubital fossa. No foreign body or soft tissue emphysema demonstrated.  IMPRESSION: 1. Soft tissue swelling around the elbow, especially anteriorly. If there is concern of an abscess, follow up CT or MRI with contrast is suggested. 2. No acute osseous findings or evidence of osteomyelitis.   Electronically Signed   By: Roxy HorsemanBill  Veazey M.D.   On: 08/29/2014 12:55   Medications: I have reviewed the patient's current medications. Scheduled Meds: . ARIPiprazole  2 mg Oral Daily  . cefTRIAXone (  ROCEPHIN)  IV  1 g Intravenous Q24H  . Chlorhexidine Gluconate Cloth  6 each Topical Q0600  . gabapentin  800 mg Oral TID  . heparin  5,000 Units Subcutaneous 3 times per day  . mupirocin ointment  1 application Nasal BID  . vancomycin  1,000 mg Intravenous Q8H  . venlafaxine XR  225 mg Oral Q breakfast   Continuous Infusions:  PRN Meds:.HYDROcodone-acetaminophen, promethazine Assessment/Plan: Active Problems:   Cellulitis and abscess   Abscess and cellulitis  Ms. Ryanna Teschner is a 34 yo woman here for treatment of her left antecubital abscess and cellulitis. Patient's wound culture resulted with gram negative coccobacillus.   Left Antecubital Cellulitis and Abscess: Traced with purple pen on admission. Significant erythema, pain and drainage, but no signs of compartment syndrome. Elbow XR revealed soft tissue swelling with no evidence of osteomyelitis. Ceftriaxone and vancomycin administered in the ED. A1c 5.3%. - Continue vancomycin and ceftriaxone per pharmacy - Appreciate hand surgery following - Awaiting sensitivities - Blood cultures NGTD - HIV pending - Norco 1 mg q4 hours - Appreciate wound care consult - Keep left arm elevated - Warm compresses - May consider MRI with contrast if patient's pain and picture have not improved by tomorrow (to evaluate for other abscesses)  Resolved Nausea: Patient had poor PO intake over the two days prior to admission. -  Phenergan 12.5 mg 16 hours PRN - IVF NS ran for 12 hours; now d/c  Anxiety and Depression: On home Effexor, Abilify and Neurontin. - Continue home Effexor 24 hr by mouth 225 mg daily - Continue home Abilify 2 mg by mouth daily - Continue home Neurontin 800 mg TID  Diet: - Regular   DVT Ppx:  - [place] heparin  Dispo: Disposition is deferred at this time, awaiting improvement of current medical problems.  Anticipated discharge in approximately 1-2 day(s).   The patient does not have a current PCP (Provider Default, MD) and does not need an [facility] hospital follow-up appointment after discharge.  The patient does not have transportation limitations that hinder transportation to clinic appointments.  .Services Needed at time of discharge: Y = Yes, Blank = No PT:   OT:   RN:   Equipment:   Other:     LOS: 1 day   Dionne Ano, MD 08/30/2014, 4:05 PM

## 2014-08-30 NOTE — Consult Note (Addendum)
WOC wound consult note Reason for Consult: Consult requested for left antecubital wound. Hand surgeon has already performed consult and recommended warm compresses. Refer to hand team progress notes on 1/24.   Wound type: Full thickness wound where abscess spontaneously ruptured. Measurement: Previous full thickness wound has evolved into 1.5X2X.1cm blistered area with skin intact over wound bed. Wound bed: Pink and moist Drainage (amount, consistency, odor) Small amt yellow drainage, no further pus, no odor Periwound: Previously marked areas of cellulitis have greatly decreased to 3X3cm surrounding wound Dressing procedure/placement/frequency: Pt is on IV antibiotics for systemic coverage. Continue present plan of care with warm compresses BID to reduce swelling and discomfort.  Foam dressing to protect and decrease pain with dressing changes. Discussed plan of care with patient and she denies further questions. Please re-consult if further assistance is needed.  Thank-you,  Cammie Mcgeeawn Corvin Sorbo MSN, RN, CWOCN, [place]AP, CNS [phone]

## 2014-08-30 NOTE — Progress Notes (Signed)
Utilization review completed. Gaither Biehn, RN, BSN. 

## 2014-08-31 LAB — BASIC METABOLIC PANEL
Anion gap: 9 (ref 5–15)
BUN: 7 mg/dL (ref 6–23)
CO2: 25 mmol/L (ref 19–32)
Calcium: 8.8 mg/dL (ref 8.4–10.5)
Chloride: 106 mmol/L (ref 96–112)
Creatinine, Ser: 0.76 mg/dL (ref 0.50–1.10)
GFR calc Af Amer: >90 mL/min (ref >90)
GFR calc non Af Amer: >90 mL/min (ref >90)
Glucose, Bld: 94 mg/dL (ref 70–99)
Potassium: 4.2 mmol/L (ref 3.5–5.1)
Sodium: 140 mmol/L (ref 135–145)

## 2014-08-31 LAB — HIV ANTIBODY (ROUTINE TESTING W REFLEX)
HIV 1/O/2 Abs-Index Value: <1 (ref <1.00)
HIV-1/HIV-2 Ab: NONREACTIVE

## 2014-08-31 LAB — VANCOMYCIN, TROUGH: Vancomycin Tr: 23.3 ug/mL — ABNORMAL HIGH (ref 10.0–20.0)

## 2014-08-31 MED ORDER — OXYCODONE HCL 5 MG PO TABS
10.0000 mg | ORAL_TABLET | ORAL | Status: DC | PRN
  Filled 2014-08-31 (×5): qty 2

## 2014-08-31 MED ORDER — SODIUM CHLORIDE 0.9 % IV SOLN
1250.0000 mg | Freq: Two times a day (BID) | INTRAVENOUS | Status: DC
  Administered 2014-08-31 (×2): 1250 mg via INTRAVENOUS
  Filled 2014-08-31 (×5): qty 1250

## 2014-08-31 MED ORDER — OXYCODONE HCL 5 MG PO TABS
10.0000 mg | ORAL_TABLET | ORAL | Status: DC | PRN
  Administered 2014-08-31 – 2014-09-01 (×6): 10 mg via ORAL
  Filled 2014-08-31: qty 2

## 2014-08-31 NOTE — Progress Notes (Signed)
Patient examined at bedside today  Much improved from initial exam in ER  WBC 5.8 yesterday and erythema resolving wit IV abx  Patient needs to soak left elbow in warm soapy water TID for 20 minutes and promote drainage from wound  Would switch to PO abx when sensitivities are obtained with follow up in my office 1/28   No need for MRI or surgery at this time

## 2014-08-31 NOTE — Progress Notes (Signed)
Subjective: [name] feels worse this morning. She is experiencing increased pain and an increased feeling of pressure in her left arm. She finds some relief from the heat pack. She and her father are very concerned.  Objective: Vital signs in last 24 hours: Filed Vitals:   08/30/14 0736 08/30/14 1430 08/30/14 1933 08/31/14 0533  BP: 110/65 111/60 117/72 103/57  Pulse: 80 70 72 63  Temp: 99.3 F (37.4 C) 98.6 F (37 C) 98.9 F (37.2 C) 98.9 F (37.2 C)  TempSrc: Oral     Resp: Height:      Weight:      SpO2: 96% 97% 98% 98%   Weight change:   Intake/Output Summary (Last 24 hours) at 08/31/14 1191 Last data filed at 08/30/14 1700  Gross per 24 hour  Intake    960 ml  Output      0 ml  Net    960 ml   Physical Exam: Appearance: in NAD, lying in bed with several family members at bedside, appears older than stated age HEENT: AT/[place], PERRL, EOMi, no lymphadenopathy, no right neck abscess (patient had one there in the past) Heart: RRR, normal S1S2, no murmurs Lungs: CTAB, no wheezes Abdomen: BS+, soft, nontender, no organomegaly Musculoskeletal: normal range of motion other than left arm, could not be fully extended at elbow due to pain Extremities: multiple tattoos (lower back, left upper arm) Neurologic: A&Ox3, grossly intact Skin: arm not currently wrapped but a heat pack is taped on top of the abscess area; improved erythematous border compared to yesterday. Exquisitely tender and warm. Harder, more indurated medially as compared to yesterday's exam. See below for image of left antecubital fossa on admission and today  On admission: 1/24 erythematous to the mid-forearm and mid humerus, tender to touch, warm to touch, edematous, with spontaneous drainage from central pustule   08/31/14:    Lab Results: Basic Metabolic Panel:  Recent Labs Lab 08/29/14 1100 08/31/14 0446  NA 137 140  K 3.8 4.2  CL 103 106  CO2 25 25  GLUCOSE 87 94  BUN 11 7    CREATININE 0.79 0.76  CALCIUM 8.9 8.8   Liver Function Tests:  Recent Labs Lab 08/29/14 1100  AST 20  ALT 18  ALKPHOS 108  BILITOT 0.4  PROT 7.3  ALBUMIN 3.5   CBC:  Recent Labs Lab 08/29/14 1100 08/30/14 0523  WBC 10.8* 5.8  NEUTROABS 8.0* 2.5  HGB 14.2 12.0  HCT 42.4 36.9  MCV 90.8 92.3  PLT 256 221   Hemoglobin A1C:  Recent Labs Lab 08/29/14 1810  HGBA1C 5.3   Urine Drug Screen: Drugs of Abuse     Component Value Date/Time   LABOPIA NONE DETECTED 08/29/2014 1159   COCAINSCRNUR NONE DETECTED 08/29/2014 1159   LABBENZ NONE DETECTED 08/29/2014 1159   AMPHETMU NONE DETECTED 08/29/2014 1159   THCU NONE DETECTED 08/29/2014 1159   LABBARB NONE DETECTED 08/29/2014 1159    Urinalysis:  Recent Labs Lab 08/29/14 1159  COLORURINE YELLOW  LABSPEC 1.021  PHURINE 6.0  GLUCOSEU NEGATIVE  HGBUR NEGATIVE  BILIRUBINUR NEGATIVE  KETONESUR NEGATIVE  PROTEINUR NEGATIVE  UROBILINOGEN 0.2  NITRITE NEGATIVE  LEUKOCYTESUR TRACE*    Micro Results: Recent Results (from the past 240 hour(s))  Culture, blood (routine x 2)     Status: None (Preliminary result)   Collection Time: 08/29/14 12:20 PM  Result Value Ref Range Status   Specimen Description BLOOD RIGHT ANTECUBITAL  Final   Special Requests   Final    BOTTLES DRAWN AEROBIC AND ANAEROBIC 10CC AER 4CC ANA   Culture   Final           BLOOD CULTURE RECEIVED NO GROWTH TO DATE CULTURE WILL BE HELD FOR 5 DAYS BEFORE ISSUING A FINAL NEGATIVE REPORT Performed at [facility]    Report Status PENDING  Incomplete  Culture, blood (routine x 2)     Status: None (Preliminary result)   Collection Time: 08/29/14 12:26 PM  Result Value Ref Range Status   Specimen Description BLOOD RIGHT HAND  Final   Special Requests BOTTLES DRAWN AEROBIC ONLY 10CC  Final   Culture   Final           BLOOD CULTURE RECEIVED NO GROWTH TO DATE CULTURE WILL BE HELD FOR 5 DAYS BEFORE ISSUING A FINAL NEGATIVE REPORT Performed at  [facility]    Report Status PENDING  Incomplete  Gram stain     Status: None   Collection Time: 08/29/14  2:16 PM  Result Value Ref Range Status   Specimen Description ABSCESS LEFT ARM  Final   Special Requests Normal  Final   Gram Stain   Final    RARE WBC NO SQUAMOUS EPITHELIAL CELLS SEEN FEW GRAM NEGATIVE COCCOBACILLI Performed at [facility]    Report Status 08/29/2014 FINAL  Final  Culture, routine-abscess     Status: None (Preliminary result)   Collection Time: 08/29/14  2:16 PM  Result Value Ref Range Status   Specimen Description ABSCESS LEFT ARM  Final   Special Requests NONE  Final   Gram Stain   Final    RARE WBC NO SQUAMOUS EPITHELIAL CELLS SEEN FEW GRAM NEGATIVE COCCOBACILLI Performed at [facility]    Culture   Final    NO GROWTH 1 DAY Performed at [facility]    Report Status PENDING  Incomplete  Surgical pcr screen     Status: Abnormal   Collection Time: 08/29/14  9:24 PM  Result Value Ref Range Status   MRSA, PCR POSITIVE (A) NEGATIVE Final    Comment: RESULT CALLED TO, READ BACK BY AND VERIFIED WITH: R.LIGHT,RN 2305 06/30/15 M.CAMPBELL    Staphylococcus aureus POSITIVE (A) NEGATIVE Final    Comment:        The Xpert SA Assay (FDA approved for NASAL specimens in patients over 88 years of age), is one component of a comprehensive surveillance program.  Test performance has been validated by [facility] for patients greater than or equal to 32 year old. It is not intended to diagnose infection nor to guide or monitor treatment.    Studies/Results: Dg Elbow Complete Left  08/29/2014   CLINICAL DATA:  Left antecubital abscess with attempted drainage. Persistent Pain and swelling. History of drug abuse. Initial encounter.  EXAM: LEFT ELBOW - COMPLETE 3+ VIEW  COMPARISON:  MRI 02/16/2014.  FINDINGS: The mineralization and alignment are normal. There is no evidence of acute fracture or dislocation. The joint spaces  are maintained. The anterior fat pad is visible, not significantly uplifted. No visible posterior fat pad identified. There is soft tissue swelling surrounding the elbow, especially anteriorly in the antecubital fossa. No foreign body or soft tissue emphysema demonstrated.  IMPRESSION: 1. Soft tissue swelling around the elbow, especially anteriorly. If there is concern of an abscess, follow up CT or MRI with contrast is suggested. 2. No acute osseous findings or evidence of osteomyelitis.  Electronically Signed   By: Roxy HorsemanBill  Veazey M.D.   On: 08/29/2014 12:55   Medications: I have reviewed the patient's current medications. Scheduled Meds: . ARIPiprazole  2 mg Oral Daily  . cefTRIAXone (ROCEPHIN)  IV  1 g Intravenous Q24H  . Chlorhexidine Gluconate Cloth  6 each Topical Q0600  . gabapentin  800 mg Oral TID  . heparin  5,000 Units Subcutaneous 3 times per day  . mupirocin ointment  1 application Nasal BID  . vancomycin  1,250 mg Intravenous Q12H  . venlafaxine XR  225 mg Oral Q breakfast   Continuous Infusions:  PRN Meds:.HYDROcodone-acetaminophen, promethazine Assessment/Plan: Active Problems:   Cellulitis and abscess   Abscess and cellulitis  Ms. [name] is a 34 yo woman here for treatment of her left antecubital abscess and cellulitis. Patient's wound culture resulted with gram negative coccobacillus.   Left Antecubital Cellulitis and Abscess: Traced with purple pen on admission. Significant erythema that is improving, but worsening pain and induration. No signs of compartment syndrome. Elbow XR revealed soft tissue swelling with no evidence of osteomyelitis. Ceftriaxone and vancomycin administered in the ED. A1c 5.3%. HIV negative. - Continue vancomycin and ceftriaxone per pharmacy - Appreciate hand surgery following; to see the patient today - Hand surgery recommended canceling MRI - Awaiting sensitivities - Blood cultures NGTD - Norco 1 mg q4 hours switched to oxycodone 10 mg  q4 hours PRN with 10 mg for breakthrough pain - Appreciate wound care consult - Keep left arm elevated - Warm compresses  Resolved Nausea: Patient had poor PO intake over the two days prior to admission. - Phenergan 12.5 mg 16 hours PRN - IVF NS ran for 12 hours; now d/c  Anxiety and Depression: On home Effexor, Abilify and Neurontin. - Continue home Effexor 24 hr by mouth 225 mg daily - Continue home Abilify 2 mg by mouth daily - Continue home Neurontin 800 mg TID  Diet: - Regular   DVT Ppx:  - [place] heparin  Dispo: Disposition is deferred at this time, awaiting improvement of current medical problems.  Anticipated discharge in approximately 1-2 day(s).   The patient does not have a current PCP (Provider Default, MD) and does not [facility] after discharge.  The patient does not have transportation limitations that hinder transportation to clinic appointments.  .Services Needed at time of discharge: Y = Yes, Blank = No PT:   OT:   RN:   Equipment:   Other:     LOS: 2 days   Dionne AnoJulia Abrian Hanover, MD 08/31/2014, 7:18 AM

## 2014-08-31 NOTE — Progress Notes (Signed)
ANTIBIOTIC CONSULT NOTE - FOLLOW UP  Pharmacy Consult for Vancomycin  Indication: Cellulitis  Allergies  Allergen Reactions  . Latex Itching    gloves   Patient Measurements: Height: 5\' 8"  (172.7 cm) Weight: 185 lb (83.915 kg) IBW/kg (Calculated) : 63.9  Vital Signs: Temp: 98.9 F (37.2 C) (01/26 0533) BP: 103/57 mmHg (01/26 0533) Pulse Rate: 63 (01/26 0533)  Labs:  Recent Labs  08/29/14 1100 08/30/14 0523 08/31/14 0446  WBC 10.8* 5.8  --   HGB 14.2 12.0  --   PLT 256 221  --   CREATININE 0.79  --  0.76    Recent Labs  08/31/14 0446  VANCOTROUGH 23.3*     Assessment: Supra-therapeutic vancomycin trough (drawn correctly)  Goal of Therapy:  Vancomycin trough level 15-20 mcg/ml  Plan:  -Change vancomycin to 1250 mg IV q12h -Re-check vancomycin trough at steady state  [name], [name] 08/31/2014,6:25 AM

## 2014-09-01 DIAGNOSIS — L03114 Cellulitis of left upper limb: Secondary | ICD-10-CM

## 2014-09-01 DIAGNOSIS — B9689 Other specified bacterial agents as the cause of diseases classified elsewhere: Secondary | ICD-10-CM

## 2014-09-01 LAB — CBC
HCT: 39.7 % (ref 36.0–46.0)
Hemoglobin: 12.8 g/dL (ref 12.0–15.0)
MCH: 29.8 pg (ref 26.0–34.0)
MCHC: 32.2 g/dL (ref 30.0–36.0)
MCV: 92.5 fL (ref 78.0–100.0)
Platelets: 260 10*3/uL (ref 150–400)
RBC: 4.29 MIL/uL (ref 3.87–5.11)
RDW: 14.8 % (ref 11.5–15.5)
WBC: 5.6 10*3/uL (ref 4.0–10.5)

## 2014-09-01 LAB — CULTURE, ROUTINE-ABSCESS

## 2014-09-01 MED ORDER — DOXYCYCLINE HYCLATE 100 MG PO TABS: 100.0000 mg | ORAL_TABLET | Freq: Two times a day (BID) | ORAL | Status: DC

## 2014-09-01 MED ORDER — OXYCODONE HCL 10 MG PO TABS: 10.0000 mg | ORAL_TABLET | ORAL | Status: DC | PRN

## 2014-09-01 MED ORDER — LEVOFLOXACIN 500 MG PO TABS: 500.0000 mg | ORAL_TABLET | Freq: Every day | ORAL | Status: DC

## 2014-09-01 NOTE — Discharge Instructions (Signed)
Please continue to soak your arm in warm, soapy baths 3 times per day.   Take the levaquin and doxycycline antibiotics as prescribed.  Attend your follow up appointment with Dr. Mina MarbleWeingold tomorrow, 09/02/14 at 9:30 AM.    Abscess An abscess is an infected area that contains a collection of pus and debris.It can occur in almost any part of the body. An abscess is also known as a furuncle or boil. CAUSES  An abscess occurs when tissue gets infected. This can occur from blockage of oil or sweat glands, infection of hair follicles, or a minor injury to the skin. As the body tries to fight the infection, pus collects in the area and creates pressure under the skin. This pressure causes pain. People with weakened immune systems have difficulty fighting infections and get certain abscesses more often.  SYMPTOMS Usually an abscess develops on the skin and becomes a painful mass that is red, warm, and tender. If the abscess forms under the skin, you may feel a moveable soft area under the skin. Some abscesses break open (rupture) on their own, but most will continue to get worse without care. The infection can spread deeper into the body and eventually into the bloodstream, causing you to feel ill.  DIAGNOSIS  Your caregiver will take your medical history and perform a physical exam. A sample of fluid may also be taken from the abscess to determine what is causing your infection. TREATMENT  Your caregiver may prescribe antibiotic medicines to fight the infection. However, taking antibiotics alone usually does not cure an abscess. Your caregiver may need to make a small cut (incision) in the abscess to drain the pus. In some cases, gauze is packed into the abscess to reduce pain and to continue draining the area. HOME CARE INSTRUCTIONS   Only take over-the-counter or prescription medicines for pain, discomfort, or fever as directed by your caregiver.  If you were prescribed antibiotics, take them as  directed. Finish them even if you start to feel better.  If gauze is used, follow your caregiver's directions for changing the gauze.  To avoid spreading the infection:  Keep your draining abscess covered with a bandage.  Wash your hands well.  Do not share personal care items, towels, or whirlpools with others.  Avoid skin contact with others.  Keep your skin and clothes clean around the abscess.  Keep all follow-up appointments as directed by your caregiver. SEEK MEDICAL CARE IF:   You have increased pain, swelling, redness, fluid drainage, or bleeding.  You have muscle aches, chills, or a general ill feeling.  You have a fever. MAKE SURE YOU:   Understand these instructions.  Will watch your condition.  Will get help right away if you are not doing well or get worse. Document Released: 05/02/2005 Document Revised: 01/22/2012 Document Reviewed: 10/05/2011 [facility] Patient Information 2015 [place]. This information is not intended to replace advice given to you by your health care provider. Make sure you discuss any questions you have with your health care provider.

## 2014-09-01 NOTE — Progress Notes (Signed)
Subjective: [name] feels better today; her pain is under better control. She cannot wait to go home.  Patient's temperature did peak at 99.2 yesterday, but she denies chills or subjective fever.  Objective: Vital signs in last 24 hours: Filed Vitals:   08/31/14 0533 08/31/14 1253 08/31/14 2023 09/01/14 0651  BP: 103/57 104/58 106/58 104/56  Pulse: 63 73 74 72  Temp: 98.9 F (37.2 C) 99.2 F (37.3 C) 99 F (37.2 C) 99.1 F (37.3 C)  TempSrc:      Resp: Height:      Weight:      SpO2: 98% 98% 98% 98%   Weight change:   Intake/Output Summary (Last 24 hours) at 09/01/14 1610 Last data filed at 08/31/14 1808  Gross per 24 hour  Intake    920 ml  Output      0 ml  Net    920 ml   Physical Exam: Appearance: in NAD, lying in bed with no family members at bedside today, appears older than stated age HEENT: AT/[place], PERRL, EOMi, no lymphadenopathy Heart: RRR, normal S1S2, no murmurs Lungs: CTAB, no wheezes Abdomen: BS+, soft, nontender, no organomegaly Musculoskeletal: normal range of motion, able to fully extend left arm today Extremities: multiple tattoos (lower back, left upper arm) Neurologic: A&Ox3, grossly intact Skin: arm currently covered with bandage; when removed, an improved erythematous border was present. Slightly tender and warm. Less indurated medially as compared to yesterday's exam. See below for image of left antecubital fossa on admission and today  On admission: 1/24 erythematous to the mid-forearm and mid humerus, tender to touch, warm to touch, edematous, with spontaneous drainage from central pustule   09/01/14:     Lab Results: Basic Metabolic Panel:  Recent Labs Lab 08/29/14 1100 08/31/14 0446  NA 137 140  K 3.8 4.2  CL 103 106  CO2 25 25  GLUCOSE 87 94  BUN 11 7  CREATININE 0.79 0.76  CALCIUM 8.9 8.8   Liver Function Tests:  Recent Labs Lab 08/29/14 1100  AST 20  ALT 18  ALKPHOS 108  BILITOT 0.4  PROT 7.3    ALBUMIN 3.5   CBC:  Recent Labs Lab 08/29/14 1100 08/30/14 0523 09/01/14 0556  WBC 10.8* 5.8 5.6  NEUTROABS 8.0* 2.5  --   HGB 14.2 12.0 12.8  HCT 42.4 36.9 39.7  MCV 90.8 92.3 92.5  PLT 256 221 260   Hemoglobin A1C:  Recent Labs Lab 08/29/14 1810  HGBA1C 5.3   Urine Drug Screen: Drugs of Abuse     Component Value Date/Time   LABOPIA NONE DETECTED 08/29/2014 1159   COCAINSCRNUR NONE DETECTED 08/29/2014 1159   LABBENZ NONE DETECTED 08/29/2014 1159   AMPHETMU NONE DETECTED 08/29/2014 1159   THCU NONE DETECTED 08/29/2014 1159   LABBARB NONE DETECTED 08/29/2014 1159    Urinalysis:  Recent Labs Lab 08/29/14 1159  COLORURINE YELLOW  LABSPEC 1.021  PHURINE 6.0  GLUCOSEU NEGATIVE  HGBUR NEGATIVE  BILIRUBINUR NEGATIVE  KETONESUR NEGATIVE  PROTEINUR NEGATIVE  UROBILINOGEN 0.2  NITRITE NEGATIVE  LEUKOCYTESUR TRACE*    Micro Results: Recent Results (from the past 240 hour(s))  Culture, blood (routine x 2)     Status: None (Preliminary result)   Collection Time: 08/29/14 12:20 PM  Result Value Ref Range Status   Specimen Description BLOOD RIGHT ANTECUBITAL  Final   Special Requests   Final    BOTTLES DRAWN AEROBIC AND ANAEROBIC 10CC AER 4CC ANA  Culture   Final           BLOOD CULTURE RECEIVED NO GROWTH TO DATE CULTURE WILL BE HELD FOR 5 DAYS BEFORE ISSUING A FINAL NEGATIVE REPORT [facility]    Report Status PENDING  Incomplete  Culture, blood (routine x 2)     Status: None (Preliminary result)   Collection Time: 08/29/14 12:26 PM  Result Value Ref Range Status   Specimen Description BLOOD RIGHT HAND  Final   Special Requests BOTTLES DRAWN AEROBIC ONLY 10CC  Final   Culture   Final           BLOOD CULTURE RECEIVED NO GROWTH TO DATE CULTURE WILL BE HELD FOR 5 DAYS BEFORE ISSUING A FINAL NEGATIVE REPORT Performed at [facility]    Report Status PENDING  Incomplete  Gram stain     Status: None   Collection Time: 08/29/14   2:16 PM  Result Value Ref Range Status   Specimen Description ABSCESS LEFT ARM  Final   Special Requests Normal  Final   Gram Stain   Final    RARE WBC NO SQUAMOUS EPITHELIAL CELLS SEEN FEW GRAM NEGATIVE COCCOBACILLI Performed at [facility]    Report Status 08/29/2014 FINAL  Final  Culture, routine-abscess     Status: None (Preliminary result)   Collection Time: 08/29/14  2:16 PM  Result Value Ref Range Status   Specimen Description ABSCESS LEFT ARM  Final   Special Requests NONE  Final   Gram Stain   Final    RARE WBC NO SQUAMOUS EPITHELIAL CELLS SEEN FEW GRAM NEGATIVE COCCOBACILLI [facility] at [facility]    Culture   Final    Culture reincubated for better growth Performed at [facility]    Report Status PENDING  Incomplete  Surgical pcr screen     Status: Abnormal   Collection Time: 08/29/14  9:24 PM  Result Value Ref Range Status   MRSA, PCR POSITIVE (A) NEGATIVE Final    Comment: RESULT CALLED TO, READ BACK BY AND VERIFIED WITH: R.LIGHT,RN 2305 06/30/15 M.CAMPBELL    Staphylococcus aureus POSITIVE (A) NEGATIVE Final    Comment:        The Xpert SA Assay (FDA approved for NASAL specimens in patients over 35 years of age), is one component of a comprehensive surveillance program.  Test performance has been validated by [facility] for patients greater than or equal to 35 year old. It is not intended to diagnose infection nor to guide or monitor treatment.    Studies/Results: No results found. Medications: I have reviewed the patient's current medications. Scheduled Meds: . ARIPiprazole  2 mg Oral Daily  . cefTRIAXone (ROCEPHIN)  IV  1 g Intravenous Q24H  . Chlorhexidine Gluconate Cloth  6 each Topical Q0600  . gabapentin  800 mg Oral TID  . heparin  5,000 Units Subcutaneous 3 times per day  . mupirocin ointment  1 application Nasal BID  . vancomycin  1,250 mg Intravenous Q12H  . venlafaxine XR  225 mg Oral Q breakfast    Continuous Infusions:  PRN Meds:.oxyCODONE, oxyCODONE, promethazine Assessment/Plan: Active Problems:   Cellulitis and abscess   Abscess and cellulitis  [name] is a 34 yo woman here for treatment of her left antecubital abscess and cellulitis. Patient's wound culture resulted with gram negative coccobacillus and sensitivities are still pending. Patient's pain and exam are much improved today and she is ready for discharge with PO  antibiotic coverage with orthopaedics follow-up tomorrow. If her antibiotics need to be adjusted once the sensitivities result, will contact the patient.  Left Antecubital Cellulitis and Abscess: Traced with purple pen on admission. Significant erythema that is improved significantly. Pain and induration also improving. Elbow XR revealed soft tissue swelling with no evidence of osteomyelitis. Ceftriaxone and vancomycin administered for 3.5 days. A1c 5.3%. HIV negative. - Transition to PO levaquin and doxycycline at discharge - Appreciate hand surgery following; to see the patient in clinic tomorrow - Awaiting sensitivities - Blood cultures NGTD - Will send home with enough oxycodone 10 mg to get her to her follow up appointment - Appreciate wound care consult - Keep left arm elevated - Soapy soaks TID for 20 minutes  Anxiety and Depression: On home Effexor, Abilify and Neurontin. - Continue home Effexor 24 hr by mouth 225 mg daily - Continue home Abilify 2 mg by mouth daily - Continue home Neurontin 800 mg TID  Diet: - Regular   DVT Ppx:  - [place] heparin  Dispo: Disposition is deferred at this time, awaiting improvement of current medical problems.  Anticipated discharge in approximately 0 day(s).   The patient does not have a current PCP (Provider Default, MD) and does not need an [facility] hospital follow-up appointment after discharge.  The patient does not have transportation limitations that hinder transportation to clinic  appointments.  .Services Needed at time of discharge: Y = Yes, Blank = No PT:   OT:   RN:   Equipment:   Other:     LOS: 3 days   Dionne Ano, MD 09/01/2014, 9:58 AM

## 2014-09-01 NOTE — Discharge Summary (Signed)
Name: [name] MRN: [id number] DOB: 12/17/1980 34 y.o. PCP: Provider Default, MD  Date of Admission: 08/29/2014 10:33 AM Date of Discharge: 09/01/2014 Attending Physician: Earl Lagos, MD  Discharge Diagnosis: Active Problems:   Cellulitis and abscess  Discharge Medications:   Medication List    STOP taking these medications        amoxicillin-clavulanate 875-125 MG per tablet  Commonly known as:  AUGMENTIN     HYDROcodone-acetaminophen 5-325 MG per tablet  Commonly known as:  NORCO/VICODIN      TAKE these medications        ARIPiprazole 2 MG tablet  Commonly known as:  ABILIFY  Take 2 mg by mouth daily.     doxycycline 100 MG tablet  Commonly known as:  VIBRA-TABS  Take 1 tablet (100 mg total) by mouth 2 (two) times daily.     gabapentin 800 MG tablet  Commonly known as:  NEURONTIN  Take 800 mg by mouth 3 (three) times daily.     levofloxacin 500 MG tablet  Commonly known as:  LEVAQUIN  Take 1 tablet (500 mg total) by mouth daily.     Oxycodone HCl 10 MG Tabs  Take 1 tablet (10 mg total) by mouth every 4 (four) hours as needed for severe pain.     venlafaxine XR 75 MG 24 hr capsule  Commonly known as:  EFFEXOR-XR  Take 75 mg by mouth daily with breakfast. Take with  capsule for a total of .        Disposition and follow-up:   [name] was discharged from [facility] in Stable condition.  At the hospital follow up visit please address:  1.  Left Antecubital Abscess / Cellulitis: Patient improved with ceftriaxone and vancomycin and was transitioned to levaquin and doxycycline. Wound culture sensitivities did not result while patient was in hospital, so will call her if she needs a change in coverage. Please follow for resolution of symptoms.   Pain: Patient's pain was controlled on oxycodone 10 mg q4 hours; she was given 6 tablets to address her pain until her follow up appointment with hand surgery  (09/02/14).  2.  Labs / imaging needed at time of follow-up: none  3.  Pending labs/ test needing follow-up: wound culture sensitivities  Follow-up Appointments: Follow-up Information    Follow up with Marlowe Shores, MD On 09/02/2014.   Specialty:  Orthopedic Surgery   Why:  9:30AM   Contact information:   [address] [place] [address] [phone]       [facility] with [facility]     On 09/03/2014.   Why:  9:00AM   Contact information:   [address] [place] [address] [phone]      Discharge Instructions:  Please continue to soak your arm in warm, soapy baths 3 times per day.   Take the levaquin and doxycycline antibiotics as prescribed.  Attend your follow up appointment with Dr. Mina Marble tomorrow, 09/02/14 at 9:30 AM.    Abscess An abscess is an infected area that contains a collection of pus and debris.It can occur in almost any part of the body. An abscess is also known as a furuncle or boil. CAUSES  An abscess occurs when tissue gets infected. This can occur from blockage of oil or sweat glands, infection of hair follicles, or a minor injury to the skin. As the body tries to fight the infection, pus collects in  the area and creates pressure under the skin. This pressure causes pain. People with weakened immune systems have difficulty fighting infections and get certain abscesses more often.  SYMPTOMS Usually an abscess develops on the skin and becomes a painful mass that is red, warm, and tender. If the abscess forms under the skin, you may feel a moveable soft area under the skin. Some abscesses break open (rupture) on their own, but most will continue to get worse without care. The infection can spread deeper into the body and eventually into the bloodstream, causing you to feel ill.  DIAGNOSIS  Your caregiver will take your medical history and perform a physical exam. A sample of fluid may also be  taken from the abscess to determine what is causing your infection. TREATMENT  Your caregiver may prescribe antibiotic medicines to fight the infection. However, taking antibiotics alone usually does not cure an abscess. Your caregiver may need to make a small cut (incision) in the abscess to drain the pus. In some cases, gauze is packed into the abscess to reduce pain and to continue draining the area. HOME CARE INSTRUCTIONS   Only take over-the-counter or prescription medicines for pain, discomfort, or fever as directed by your caregiver.  If you were prescribed antibiotics, take them as directed. Finish them even if you start to feel better.  If gauze is used, follow your caregiver's directions for changing the gauze.  To avoid spreading the infection:  Keep your draining abscess covered with a bandage.  Wash your hands well.  Do not share personal care items, towels, or whirlpools with others.  Avoid skin contact with others.  Keep your skin and clothes clean around the abscess.  Keep all follow-up appointments as directed by your caregiver. SEEK MEDICAL CARE IF:   You have increased pain, swelling, redness, fluid drainage, or bleeding.  You have muscle aches, chills, or a general ill feeling.  You have a fever. MAKE SURE YOU:   Understand these instructions.  Will watch your condition.  Will get help right away if you are not doing well or get worse. Document Released: 05/02/2005 Document Revised: 01/22/2012 Document Reviewed: 10/05/2011 [facility] Patient Information 2015 [place], [place]. This information is not intended to replace advice given to you by your health care provider. Make sure you discuss any questions you have with your health care provider.   Consultations:    Procedures Performed:  Dg Elbow Complete Left  08/29/2014   CLINICAL DATA:  Left antecubital abscess with attempted drainage. Persistent Pain and swelling. History of drug abuse. Initial  encounter.  EXAM: LEFT ELBOW - COMPLETE 3+ VIEW  COMPARISON:  MRI 02/16/2014.  FINDINGS: The mineralization and alignment are normal. There is no evidence of acute fracture or dislocation. The joint spaces are maintained. The anterior fat pad is visible, not significantly uplifted. No visible posterior fat pad identified. There is soft tissue swelling surrounding the elbow, especially anteriorly in the antecubital fossa. No foreign body or soft tissue emphysema demonstrated.  IMPRESSION: 1. Soft tissue swelling around the elbow, especially anteriorly. If there is concern of an abscess, follow up CT or MRI with contrast is suggested. 2. No acute osseous findings or evidence of osteomyelitis.   Electronically Signed   By: Roxy Horseman M.D.   On: 08/29/2014 12:55    Admission HPI: Ms. [name] is a 34 yo woman with a history of anxiety, depression and multiple hospitalizations for abscesses with cellulitis who was admitted for care of her draining,  erythematous left antecubital abscess. She first noticed the redness three days ago when she was in the shower. The area started to hurt the next day, to the point where she couldn't fully extend her arm due to pain. She had a friend poke a needle into the abscess last night when the pain and pressure were 10/10 in intensity and she could tell it was fluctuant. Since then, the redness and pain have worsened, and she developed nausea. She denies chills, subjective fever, recent scratches or trauma and any history of IV drug use. She decided to come into the ED.  She actually has had abscesses on her right neck, right groin and left arm in the past. She had a similar admission in 02/2014 for cellulitic change and mild myositis in the left hand and forearm (treated with vancomycin and cefazolin --> doxycycline and keflex; she did not require surgery). [facility] this month and was treated with augmentin and a tetanus  shot; she did not complete her full course of antibiotics, but her wound has healed.   08/29/14: left antecubital fossa on admission:   09/01/14: left antecubital fossa on day of discharge:    Hospital Course by problem list: Active Problems:   Cellulitis and abscess   Ms. Cleopatra CedarChristy Nier is a 34 yo woman here for treatment of her left antecubital abscess and cellulitis. Patient's wound culture resulted with gram negative coccobacillus and sensitivities are still pending. Patient's pain and exam improved markedly; she has orthopaedics follow-up the day after discharge. If her antibiotics need to be adjusted once the sensitivities result, primary team will contact the patient.  Left Antecubital Cellulitis and Abscess: Traced with purple pen on admission. Significant erythema, pain, edema and induration that improved significantly. Elbow XR revealed soft tissue swelling with no evidence of osteomyelitis. Ceftriaxone and vancomycin administered for 3.5 days and she was transitioned to a 5 day course of PO levaquin 500 daily and doxycycline 100 mg BID. Wound culture sensitivities pending. Blood cultures with NGTD. A1c 5.3%. HIV negative. She is to perform soapy soaks three times daily.  Anxiety and Depression: Continued with home Effexor, Abilify and Neurontin.  Discharge Vitals:   BP 104/56 mmHg  Pulse 72  Temp(Src) 99.1 F (37.3 C) (Oral)  Resp 18  Ht 5\' 8"  (1.727 m)  Wt 185 lb (83.915 kg)  BMI 28.14 kg/m2  SpO2 98%  LMP 08/04/2014  Discharge Labs:  Results for orders placed or performed during the hospital encounter of 08/29/14 (from the past 24 hour(s))  CBC     Status: None   Collection Time: 09/01/14  5:56 AM  Result Value Ref Range   WBC 5.6 4.0 - 10.5 K/uL   RBC 4.29 3.87 - 5.11 MIL/uL   Hemoglobin 12.8 12.0 - 15.0 g/dL   HCT 64.439.7 03.436.0 - 74.246.0 %   MCV 92.5 78.0 - 100.0 fL   MCH 29.8 26.0 - 34.0 pg   MCHC 32.2 30.0 - 36.0 g/dL   RDW 59.514.8 63.811.5 - 75.615.5 %   Platelets 260 150 -  400 K/uL    Signed: Dionne AnoJulia Bijan Ridgley, MD 09/01/2014, 10:21 AM    Services Ordered on Discharge: none Equipment Ordered on Discharge: none

## 2014-09-03 ENCOUNTER — Inpatient Hospital Stay: Payer: Medicaid Other

## 2014-09-04 LAB — CULTURE, BLOOD (ROUTINE X 2)
Culture: NO GROWTH
Culture: NO GROWTH

## 2015-09-13 ENCOUNTER — Ambulatory Visit: Payer: Medicaid Other | Admitting: Gastroenterology

## 2015-12-08 ENCOUNTER — Ambulatory Visit: Payer: Self-pay

## 2016-04-06 ENCOUNTER — Emergency Department
Admission: EM | Admit: 2016-04-06 | Discharge: 2016-04-06 | Disposition: A | Discharge status: Home / Self Care | Payer: Self-pay | Attending: Emergency Medicine | Admitting: Emergency Medicine

## 2016-04-06 DIAGNOSIS — Z79899 Other long term (current) drug therapy: Secondary | ICD-10-CM | POA: Insufficient documentation

## 2016-04-06 DIAGNOSIS — I10 Essential (primary) hypertension: Secondary | ICD-10-CM | POA: Insufficient documentation

## 2016-04-06 DIAGNOSIS — M25561 Pain in right knee: Secondary | ICD-10-CM | POA: Insufficient documentation

## 2016-04-06 DIAGNOSIS — F17210 Nicotine dependence, cigarettes, uncomplicated: Secondary | ICD-10-CM | POA: Insufficient documentation

## 2016-04-06 MED ORDER — TRAMADOL HCL 50 MG PO TABS: 50.0000 mg | ORAL_TABLET | Freq: Four times a day (QID) | ORAL | 0 refills | Status: AC | PRN

## 2016-04-06 NOTE — ED Provider Notes (Signed)
[facility] Emergency Department Provider Note   ____________________________________________    I have reviewed the triage vital signs and the nursing notes.   HISTORY  Chief Complaint Knee Pain     HPI [name] is a 35 y.o. female who presents with complaints of right knee pain. This is a chronic pain which she has had for years. She reports it seems to have worsened today at work and she was sent home from her job. She denies injury. She reports it feels like it will buckle at times. She complains of pain primarily along the right lateral aspect which is moderate.She reports she came to the ED for referral to orthopedics   Past Medical History:  Diagnosis Date  . Anxiety   . Bipolar 1 disorder (HCC)   . Depression   . Hypertension     Patient Active Problem List   Diagnosis Date Noted  . Cellulitis and abscess 08/29/2014  . Myositis 02/16/2014  . Anxiety and depression 02/16/2014    Past Surgical History:  Procedure Laterality Date  . TUBAL LIGATION      Prior to Admission medications   Medication Sig Start Date End Date Taking? Authorizing Provider  ARIPiprazole (ABILIFY) 2 MG tablet Take 2 mg by mouth daily.    Historical Provider, MD  doxycycline (VIBRA-TABS) 100 MG tablet Take 1 tablet (100 mg total) by mouth 2 (two) times daily. 09/01/14   Stark BrayJulia E Mallory, MD  gabapentin (NEURONTIN) 800 MG tablet Take 800 mg by mouth 3 (three) times daily.    Historical Provider, MD  levofloxacin (LEVAQUIN) 500 MG tablet Take 1 tablet (500 mg total) by mouth daily. 09/01/14   Stark BrayJulia E Mallory, MD  oxyCODONE 10 MG TABS Take 1 tablet (10 mg total) by mouth every 4 (four) hours as needed for severe pain. 09/01/14   Stark BrayJulia E Mallory, MD  traMADol (ULTRAM) 50 MG tablet Take 1 tablet (50 mg total) by mouth every 6 (six) hours as needed. 04/06/16 04/06/17  Jene Everyobert Dorn Hartshorne, MD  venlafaxine XR (EFFEXOR-XR) 75 MG 24 hr capsule Take 75 mg by mouth daily with  breakfast. Take with 150mg  capsule for a total of 225mg .    Historical Provider, MD     Allergies Latex  No family history on file.  Social History Social History  Substance Use Topics  . Smoking status: Current Every Day Smoker    Packs/day: 1.00    Types: Cigarettes  . Smokeless tobacco: Never Used  . Alcohol use Yes     Comment: twisted tea and a few beers everyday    Review of Systems  Constitutional: No fever/chills  ENT: No sore throat.   Gastrointestinal: No abdominal pain.  No nausea, no vomiting.   Genitourinary: Negative for dysuria. Musculoskeletal: Negative for back pain. Skin: Negative for rash. Neurological: Negative for headaches     ____________________________________________   PHYSICAL EXAM:  VITAL SIGNS: ED Triage Vitals  Enc Vitals Group     BP 04/06/16 1544 126/76     Pulse Rate 04/06/16 1544 77     Resp 04/06/16 1544 16     Temp 04/06/16 1544 98.1 F (36.7 C)     Temp Source 04/06/16 1544 Oral     SpO2 04/06/16 1544 95 %     Weight --      Height --      Head Circumference --      Peak Flow --      Pain Score  04/06/16 1648 5     Pain Loc --      Pain Edu? --      Excl. in GC? --      Constitutional: Alert and oriented. No acute distress. Pleasant and interactive  Head: Atraumatic.  Genitourinary: deferred Musculoskeletal: Mild tenderness along the lateral aspect of right knee, no bony other maladies. No swelling. No effusion. No erythema. Warm and well perfused  Neurologic:  Normal speech and language. No gross focal neurologic deficits are appreciated.   Skin:  Skin is warm, dry and intact. No rash noted.   ____________________________________________   LABS (all labs ordered are listed, but only abnormal results are displayed)  Labs Reviewed - No data to  display ____________________________________________  EKG   ____________________________________________  RADIOLOGY  None ____________________________________________   PROCEDURES  Procedure(s) performed: No    Critical Care performed: No ____________________________________________   INITIAL IMPRESSION / ASSESSMENT AND PLAN / ED COURSE  Pertinent labs & imaging results that were available during my care of the patient were reviewed by me and considered in my medical decision making (see chart for details). Orthopedics referral given, conservative management ____________________________________________   FINAL CLINICAL IMPRESSION(S) / ED DIAGNOSES  Final diagnoses:  Right knee pain      NEW MEDICATIONS STARTED DURING THIS VISIT:  Discharge Medication List as of 04/06/2016  4:42 PM    START taking these medications   Details  traMADol (ULTRAM) 50 MG tablet Take 1 tablet (50 mg total) by mouth every 6 (six) hours as needed., Starting Fri 04/06/2016, Until Sat 04/06/2017, Print         Note:  This document was prepared using Dragon voice recognition software and may include unintentional dictation errors.    Jene Every, MD 04/06/16 1950

## 2016-04-06 NOTE — ED Triage Notes (Signed)
Pt c/o right knee pain that has progressed over the past 6 years and in the past week and is keeping her up at night..Marland Kitchen

## 2016-04-07 MED ORDER — HYDROCODONE-ACETAMINOPHEN 5-325 MG PO TABS
ORAL_TABLET | ORAL | Status: AC
Start: 2016-04-07 — End: 2016-04-07
  Filled 2016-04-07: qty 2

## 2016-10-25 ENCOUNTER — Encounter (HOSPITAL_COMMUNITY): Payer: Self-pay

## 2016-10-25 ENCOUNTER — Emergency Department (HOSPITAL_COMMUNITY): Payer: Self-pay

## 2016-10-25 ENCOUNTER — Emergency Department (HOSPITAL_COMMUNITY)
Admission: EM | Admit: 2016-10-25 | Discharge: 2016-10-25 | Disposition: A | Discharge status: Home / Self Care | Payer: Self-pay | Attending: Emergency Medicine | Admitting: Emergency Medicine

## 2016-10-25 DIAGNOSIS — R062 Wheezing: Secondary | ICD-10-CM | POA: Insufficient documentation

## 2016-10-25 DIAGNOSIS — Z91040 Latex allergy status: Secondary | ICD-10-CM | POA: Insufficient documentation

## 2016-10-25 DIAGNOSIS — F17210 Nicotine dependence, cigarettes, uncomplicated: Secondary | ICD-10-CM | POA: Insufficient documentation

## 2016-10-25 DIAGNOSIS — B9789 Other viral agents as the cause of diseases classified elsewhere: Secondary | ICD-10-CM

## 2016-10-25 DIAGNOSIS — J069 Acute upper respiratory infection, unspecified: Secondary | ICD-10-CM | POA: Insufficient documentation

## 2016-10-25 DIAGNOSIS — Z79899 Other long term (current) drug therapy: Secondary | ICD-10-CM | POA: Insufficient documentation

## 2016-10-25 DIAGNOSIS — I10 Essential (primary) hypertension: Secondary | ICD-10-CM | POA: Insufficient documentation

## 2016-10-25 MED ORDER — HYDROCODONE-HOMATROPINE 5-1.5 MG/5ML PO SYRP: 5.0000 mL | ORAL_SOLUTION | Freq: Four times a day (QID) | ORAL | 0 refills | Status: DC | PRN

## 2016-10-25 MED ORDER — PREDNISONE 20 MG PO TABS: ORAL_TABLET | ORAL | 0 refills | Status: DC

## 2016-10-25 MED ORDER — ALBUTEROL SULFATE HFA 108 (90 BASE) MCG/ACT IN AERS
2.0000 | INHALATION_SPRAY | RESPIRATORY_TRACT | Status: DC | PRN
  Administered 2016-10-25: 2 via RESPIRATORY_TRACT
  Filled 2016-10-25: qty 6.7

## 2016-10-25 NOTE — ED Triage Notes (Signed)
Patient complains of cough x 2 weeks that is worse at night with chills and weakness. Cough worse when she lays down. States taking otc meds with no relief. Smoker. Had diarrhea earlier in week.

## 2016-10-25 NOTE — ED Provider Notes (Signed)
[facility]-EMERGENCY DEPT Provider Note   CSN: [id number] Arrival date & time: 10/25/16  1401   By signing my name below, I, Soijett Blue, attest that this documentation has been prepared under the direction and in the presence of Rolan BuccoMelanie Shaley Leavens, MD. Electronically Signed: Soijett Blue, ED Scribe. 10/25/16. 3:38 PM.  History   Chief Complaint Chief Complaint  Patient presents with  . cough, congestion, insect bite    HPI [name] is a 36 y.o. female with a PMHx of HTN, who presents to the Emergency Department complaining of cough onset 5 days ago. Pt reports associated fever (TMAX 103), nasal congestion, sneezing, diarrhea, and decreased appetite. Pt has nyquil, dayquil, robitussin, friends Rx Zithromax, and spouses albuteral inhaler with no relief of her symptoms. She also reports that she was bit to her right sided neck by an insect with welts noticed following. Pt states that her symptoms have resolved at this time. Denies vomiting and any other symptoms. She states that she smokes cigarettes at this time, but hasn't been able to smoke since the onset of her symptoms.    The history is provided by the patient and the spouse. No language interpreter was used.    Past Medical History:  Diagnosis Date  . Anxiety   . Bipolar 1 disorder (HCC)   . Depression   . Hypertension     Patient Active Problem List   Diagnosis Date Noted  . Cellulitis and abscess 08/29/2014  . Myositis 02/16/2014  . Anxiety and depression 02/16/2014    Past Surgical History:  Procedure Laterality Date  . TUBAL LIGATION      OB History    No data available       Home Medications    Prior to Admission medications   Medication Sig Start Date End Date Taking? Authorizing Provider  ARIPiprazole (ABILIFY) 2 MG tablet Take 2 mg by mouth daily.    Historical Provider, MD  doxycycline (VIBRA-TABS) 100 MG tablet Take 1 tablet (100 mg total) by mouth 2 (two) times daily. 09/01/14   Stark BrayJulia E Mallory,  MD  gabapentin (NEURONTIN) 800 MG tablet Take 800 mg by mouth 3 (three) times daily.    Historical Provider, MD  HYDROcodone-homatropine (HYCODAN) 5-1.5 MG/5ML syrup Take 5 mLs by mouth every 6 (six) hours as needed for cough. 10/25/16   Rolan BuccoMelanie Leonia Heatherly, MD  levofloxacin (LEVAQUIN) 500 MG tablet Take 1 tablet (500 mg total) by mouth daily. 09/01/14   Stark BrayJulia E Mallory, MD  oxyCODONE 10 MG TABS Take 1 tablet (10 mg total) by mouth every 4 (four) hours as needed for severe pain. 09/01/14   Stark BrayJulia E Mallory, MD  predniSONE (DELTASONE) 20 MG tablet 3 tabs po day one, then 2 tabs daily x 4 days 10/25/16   Rolan BuccoMelanie Rey Dansby, MD  traMADol (ULTRAM) 50 MG tablet Take 1 tablet (50 mg total) by mouth every 6 (six) hours as needed. 04/06/16 04/06/17  Jene Everyobert Kinner, MD  venlafaxine XR (EFFEXOR-XR) 75 MG 24 hr capsule Take 75 mg by mouth daily with breakfast. Take with 150mg  capsule for a total of 225mg .    Historical Provider, MD    Family History No family history on file.  Social History Social History  Substance Use Topics  . Smoking status: Current Every Day Smoker    Packs/day: 1.00    Types: Cigarettes  . Smokeless tobacco: Never Used  . Alcohol use Yes     Comment: twisted tea and a few beers everyday  Allergies   Latex   Review of Systems Review of Systems  Constitutional: Positive for appetite change and fever (TMAX 103). Negative for chills, diaphoresis and fatigue.  HENT: Positive for congestion, rhinorrhea and sneezing.   Eyes: Negative.   Respiratory: Positive for cough and wheezing. Negative for chest tightness and shortness of breath.   Cardiovascular: Negative for chest pain and leg swelling.  Gastrointestinal: Positive for diarrhea. Negative for abdominal pain, blood in stool, nausea and vomiting.  Genitourinary: Negative for difficulty urinating, flank pain, frequency and hematuria.  Musculoskeletal: Negative for arthralgias and back pain.  Skin: Negative for rash.  Neurological:  Negative for dizziness, speech difficulty, weakness, numbness and headaches.     Physical Exam Updated Vital Signs BP (!) 133/95 (BP Location: Right Arm)   Pulse 94   Temp 98.2 F (36.8 C)   Resp 19   Ht 5\' 7"  (1.702 m)   Wt 160 lb (72.6 kg)   LMP 10/02/2016   SpO2 95%   BMI 25.06 kg/m   Physical Exam  Constitutional: She is oriented to person, place, and time. She appears well-developed and well-nourished.  HENT:  Head: Normocephalic and atraumatic.  Right Ear: External ear normal.  Left Ear: External ear normal.  Eyes: Pupils are equal, round, and reactive to light.  Neck: Normal range of motion. Neck supple.  Cardiovascular: Normal rate, regular rhythm and normal heart sounds.   Pulmonary/Chest: Effort normal. No respiratory distress. She has wheezes. She has no rales. She exhibits no tenderness.  Mild expiratory wheezing bilaterally.   Abdominal: Soft. Bowel sounds are normal. There is no tenderness. There is no rebound and no guarding.  Musculoskeletal: Normal range of motion. She exhibits no edema.  Lymphadenopathy:    She has no cervical adenopathy.  Neurological: She is alert and oriented to person, place, and time.  Skin: Skin is warm and dry. No rash noted.  Psychiatric: She has a normal mood and affect.     ED Treatments / Results  DIAGNOSTIC STUDIES: Oxygen Saturation is 100% on RA, nl by my interpretation.    COORDINATION OF CARE: 3:36 PM Discussed treatment plan with pt at bedside which includes CXR and pt agreed to plan.  Radiology Dg Chest 2 View  Result Date: 10/25/2016 CLINICAL DATA:  Cough EXAM: CHEST  2 VIEW COMPARISON:  08/26/2014 FINDINGS: The heart size and mediastinal contours are within normal limits. Both lungs are clear. The visualized skeletal structures are unremarkable. IMPRESSION: No active cardiopulmonary disease. Electronically Signed   By: Marlan Palau M.D.   On: 10/25/2016 14:27    Procedures Procedures (including critical care  time)  Medications Ordered in ED Medications  albuterol (PROVENTIL HFA;VENTOLIN HFA) 108 (90 Base) MCG/ACT inhaler 2 puff (not administered)     Initial Impression / Assessment and Plan / ED Course  I have reviewed the triage vital signs and the nursing notes.  Pertinent imaging results that were available during my care of the patient were reviewed by me and considered in my medical decision making (see chart for details).     Patient presents with upper respiratory symptoms. There is no evidence of pneumonia. She is well-appearing with wheezing on exam but no increased work of breathing. She has no hypoxia. No persistent tachycardia. She was discharged home in good condition. She was dispensed an albuterol inhaler in the ED and given prescriptions for a five-day course of prednisone as well as Hycodan cough syrup. She was advised to return if she has any  worsening symptoms.  Final Clinical Impressions(s) / ED Diagnoses   Final diagnoses:  Viral URI with cough  Wheezing    New Prescriptions New Prescriptions   HYDROCODONE-HOMATROPINE (HYCODAN) 5-1.5 MG/5ML SYRUP    Take 5 mLs by mouth every 6 (six) hours as needed for cough.   PREDNISONE (DELTASONE) 20 MG TABLET    3 tabs po day one, then 2 tabs daily x 4 days   I personally performed the services described in this documentation, which was scribed in my presence.  The recorded information has been reviewed and considered.     Rolan Bucco, MD 10/25/16 [phone]

## 2017-11-14 DIAGNOSIS — M19072 Primary osteoarthritis, left ankle and foot: Secondary | ICD-10-CM | POA: Diagnosis not present

## 2017-11-14 DIAGNOSIS — S96912A Strain of unspecified muscle and tendon at ankle and foot level, left foot, initial encounter: Secondary | ICD-10-CM | POA: Diagnosis not present

## 2017-12-11 DIAGNOSIS — J0100 Acute maxillary sinusitis, unspecified: Secondary | ICD-10-CM | POA: Diagnosis not present

## 2018-01-17 ENCOUNTER — Ambulatory Visit: Payer: Self-pay | Admitting: Family Medicine

## 2018-02-11 ENCOUNTER — Telehealth: Payer: Self-pay

## 2018-02-11 DIAGNOSIS — M779 Enthesopathy, unspecified: Secondary | ICD-10-CM | POA: Diagnosis not present

## 2018-02-11 NOTE — Telephone Encounter (Signed)
Copied from CRM [id number]. Topic: General - Other >> Feb 11, 2018 10:14 AM Gaynelle AduPoole, Shalonda wrote: Reason for CRM: patient has an New patient appt for 7-12,  with Dr. Shirlee LatchMclean she is calling because she injury herself at work yesterday and she thinking her same issues in regards to her  Cellulitis and abscess is back.  She wants to know is there anything she can do until her appt. Please advise

## 2018-02-11 NOTE — Telephone Encounter (Signed)
Patient needs to be evaluated.  She can not do anything until patient has established care here

## 2018-02-14 ENCOUNTER — Encounter: Payer: Self-pay | Admitting: Internal Medicine

## 2018-02-14 ENCOUNTER — Ambulatory Visit: Payer: BLUE CROSS/BLUE SHIELD | Admitting: Internal Medicine

## 2018-02-14 VITALS — BP 110/70 | HR 78 | Temp 98.1°F | Ht 67.0 in | Wt 181.2 lb

## 2018-02-14 DIAGNOSIS — F419 Anxiety disorder, unspecified: Secondary | ICD-10-CM | POA: Diagnosis not present

## 2018-02-14 DIAGNOSIS — F319 Bipolar disorder, unspecified: Secondary | ICD-10-CM | POA: Diagnosis not present

## 2018-02-14 DIAGNOSIS — Z1329 Encounter for screening for other suspected endocrine disorder: Secondary | ICD-10-CM | POA: Diagnosis not present

## 2018-02-14 DIAGNOSIS — Z1159 Encounter for screening for other viral diseases: Secondary | ICD-10-CM | POA: Diagnosis not present

## 2018-02-14 DIAGNOSIS — Z1389 Encounter for screening for other disorder: Secondary | ICD-10-CM | POA: Diagnosis not present

## 2018-02-14 DIAGNOSIS — G4700 Insomnia, unspecified: Secondary | ICD-10-CM | POA: Diagnosis not present

## 2018-02-14 DIAGNOSIS — Z13220 Encounter for screening for lipoid disorders: Secondary | ICD-10-CM

## 2018-02-14 DIAGNOSIS — F329 Major depressive disorder, single episode, unspecified: Secondary | ICD-10-CM

## 2018-02-14 DIAGNOSIS — Z0184 Encounter for antibody response examination: Secondary | ICD-10-CM | POA: Diagnosis not present

## 2018-02-14 DIAGNOSIS — Z113 Encounter for screening for infections with a predominantly sexual mode of transmission: Secondary | ICD-10-CM

## 2018-02-14 DIAGNOSIS — Z0000 Encounter for general adult medical examination without abnormal findings: Secondary | ICD-10-CM

## 2018-02-14 DIAGNOSIS — Z1283 Encounter for screening for malignant neoplasm of skin: Secondary | ICD-10-CM | POA: Diagnosis not present

## 2018-02-14 DIAGNOSIS — F1910 Other psychoactive substance abuse, uncomplicated: Secondary | ICD-10-CM

## 2018-02-14 DIAGNOSIS — E559 Vitamin D deficiency, unspecified: Secondary | ICD-10-CM

## 2018-02-14 DIAGNOSIS — Z13818 Encounter for screening for other digestive system disorders: Secondary | ICD-10-CM

## 2018-02-14 DIAGNOSIS — F32A Depression, unspecified: Secondary | ICD-10-CM

## 2018-02-14 MED ORDER — TRAZODONE HCL 100 MG PO TABS
100.0000 mg | ORAL_TABLET | Freq: Every evening | ORAL | 2 refills | Status: DC | PRN
Start: 2018-02-14 — End: 2018-05-02

## 2018-02-14 MED ORDER — GABAPENTIN 400 MG PO CAPS: 400.0000 mg | ORAL_CAPSULE | Freq: Three times a day (TID) | ORAL | 2 refills | Status: DC

## 2018-02-14 MED ORDER — LAMOTRIGINE 200 MG PO TABS: 200.0000 mg | ORAL_TABLET | Freq: Every day | ORAL | 2 refills | Status: DC

## 2018-02-14 MED ORDER — BUPROPION HCL ER (XL) 300 MG PO TB24: 300.0000 mg | ORAL_TABLET | Freq: Every day | ORAL | 2 refills | Status: DC

## 2018-02-14 MED ORDER — ZIPRASIDONE HCL 40 MG PO CAPS: 40.0000 mg | ORAL_CAPSULE | Freq: Every day | ORAL | 2 refills | Status: DC

## 2018-02-14 MED ORDER — NALTREXONE HCL 50 MG PO TABS: 100.0000 mg | ORAL_TABLET | Freq: Every day | ORAL | 2 refills | Status: DC

## 2018-02-14 NOTE — Patient Instructions (Addendum)
F/u 1 month pap and physical  Labs sch next week fasting     Living With Bipolar Disorder If you have been diagnosed with bipolar disorder, you may be relieved that you now know why you have felt or behaved a certain way. You may also feel overwhelmed about the treatment ahead, how to get the support you need, and how to deal with the condition day-to-day. With care and support, you can learn to manage your symptoms and live with bipolar disorder. How to manage lifestyle changes Managing stress Stress is your body's reaction to life changes and events, both good and bad. Stress can play a major role in bipolar disorder, so it is important to learn how to cope with stress. Some techniques to cope with stress include:  Meditation, muscle relaxation, and breathing exercises.  Exercise. Even a short daily walk can help to lower stress levels.  Getting enough good-quality sleep. Too little sleep can cause mania to start (can trigger mania).  Making a schedule to manage your time. Knowing your daily schedule can help to keep you from feeling overwhelmed by tasks and deadlines.  Spending time on hobbies that you enjoy.  Medicines Your health care provider may suggest certain medicines if he or she feels that they will help improve your condition. Avoid using caffeine, alcohol, and other substances that may prevent your medicines from working properly (may interact). It is also important to:  Talk with your pharmacist or health care provider about all the medicines that you take, their possible side effects, and which medicines are safe to take together.  Make it your goal to take part in all treatment decisions (shared decision-making). Ask about possible side effects of medicines that your health care provider recommends, and tell him or her how you feel about having those side effects. It is best if shared decision-making with your health care provider is part of your total treatment plan.  If  you are taking medicines as part of your treatment, do not stop taking medicines before you ask your health care provider if it is safe to stop. You may need to have the medicine slowly decreased (tapered) over time to decrease the risk of harmful side effects. Relationships Spend time with people that you trust and with whom you feel a sense of understanding and calm. Try to find friends or family members who make you feel safe and can help you control feelings of mania. Consider going to couples counseling, family education classes, or family therapy to:  Educate your loved ones about your condition and offer suggestions about how they can support you.  Help resolve conflicts.  Help develop communication skills in your relationships.  How to recognize changes in your condition Everyone responds differently to treatment for bipolar disorder. Some signs that your condition is improving include:  Leveling of your mood. You may have less anger and excitement about daily activities, and your low moods may not be as bad.  Your symptoms being less intense.  Feeling calm more often.  Thinking clearly.  Not experiencing consequences for extreme behavior.  Feeling like your life is settling down.  Your behavior seeming more normal to you and to other people.  Some signs that your condition may be getting worse include:  Sleep problems.  Moods cycling between deep lows and unusually high (excess) energy.  Extreme emotions.  More anger at loved ones.  Staying away from others (isolating yourself).  A feeling of power or superiority.  Completing  a lot of tasks in a very short amount of time.  Unusual thoughts and behaviors.  Suicidal thoughts.  Where to find support Talking to others  Try making a list of the people you may want to tell about your condition, such as the people you trust most.  Plan what you are willing to talk about and what you do not want to discuss. Think  about your needs ahead of time, and how your friends and family members can support you.  Let your loved ones know when they can share advice and when you would just like them to listen.  Give your loved ones information about bipolar disorder, and encourage them to learn about the condition. Finances Not all insurance plans cover mental health care, so it is important to check with your insurance carrier. If paying for co-pays or counseling services is a problem, search for a local or county mental health care center. Public mental health care services may be offered there at a low cost or no cost when you are not able to see a private health care provider. If you are taking medicine for depression, you may be able to get the generic form, which may be less expensive than brand-name medicine. Some makers of prescription medicines also offer help to patients who cannot afford the medicines they need. Follow these instructions at home: Medicines  Take over-the-counter and prescription medicines only as told by your health care provider or pharmacist.  Ask your pharmacist what over-the-counter cold medicines you should avoid. Some medicines can make symptoms worse. General instructions  Ask for support from trusted family members or friends to make sure you stay on track with your treatment.  Keep a journal to write down your daily moods, medicines, sleep habits, and life events. This may help you have more success with your treatment.  Make and follow a routine for daily meal times. Eat healthy foods, such as whole grains, vegetables, and fresh fruit.  Try to go to sleep and wake up around the same time every day.  Keep all follow-up visits as told by your health care provider. This is important. Questions to ask your health care provider:  If you are taking medicines: ? How long do I need to take medicine? ? Are there any long-term side effects of my medicine? ? Are there any  alternatives to taking medicine?  How would I benefit from therapy?  How often should I follow up with a health care provider? Contact a health care provider if:  Your symptoms get worse or they do not get better with treatment. Get help right away if:  You have thoughts about harming yourself or others. If you ever feel like you may hurt yourself or others, or have thoughts about taking your own life, get help right away. You can go to your nearest emergency department or call:  Your local emergency services (911 in the [place]  A suicide crisis helpline, such as the National Suicide Prevention Lifeline at 541-399-74601-404-081-7441. This is open 24-hours a day.  Summary  Learning ways to deal with stress can help to calm you and may also help your treatment work better.  There is a wide range of medicines that can help to treat bipolar disorder.  Having healthy relationships can help to make your moods more stable.  Contact a health care provider if your symptoms get worse or they do not get better with treatment. This information is not intended to replace advice given  to you by your health care provider. Make sure you discuss any questions you have with your health care provider. Document Released: 11/22/2016 Document Revised: 11/22/2016 Document Reviewed: 11/22/2016 Elsevier Interactive Patient Education  [employer].

## 2018-02-14 NOTE — Progress Notes (Signed)
Pre visit review using our clinic review tool, if applicable. No additional management support is needed unless otherwise documented below in the visit note. 

## 2018-02-14 NOTE — Progress Notes (Addendum)
Chief Complaint  Patient presents with  . Establish Care    NP   NP with son 38 y.o today 1. H/o depression/anxiety and bipolar h/o substance abuse (see A&P) PHQ 9 10 today. Tried vistaril for sleep in the past for anxiety made sleepy. She needs temp refill of medications until can establish with psychiatry and substance tx. On gabapentin 300 mg tid but wants to increase the dose  2. Would like dermatology referral  3. 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 She has sling today but currently has sling off    Review of Systems  Constitutional: Negative for weight loss.  HENT: Negative for hearing loss.   Eyes: Negative for blurred vision.  Respiratory: Negative for shortness of breath.   Cardiovascular: Negative for chest pain.  Gastrointestinal: Negative for abdominal pain.  Musculoskeletal: Positive for joint pain.  Skin: Negative for rash.  Neurological: Negative for headaches.  Psychiatric/Behavioral: Positive for depression. Negative for substance abuse and suicidal ideas. The patient is nervous/anxious and has insomnia.    Past Medical History:  Diagnosis Date  . Anxiety   . Bipolar 1 disorder (HCC)   . Depression   . Hypertension   . Substance abuse (HCC)    etoh, heroin, cocaine, opiates  . Tendonitis    L arm    Past Surgical History:  Procedure Laterality Date  . TUBAL LIGATION     Family History  Problem Relation Age of Onset  . Cancer Mother        skin   . Alcohol abuse Father   . Depression Father   . Thyroid disease Father   . Stroke Father   . Cancer Maternal Grandmother        breast  . Cancer Paternal Grandmother        breast    Social History   Socioeconomic History  . Marital status: Single    Spouse name: Not on file  . Number of children: Not on file  . Years of education: Not on file  . Highest  education level: Not on file  Occupational History  . Not on file  Social Needs  . Financial resource strain: Not on file  . Food insecurity:    Worry: Not on file    Inability: Not on file  . Transportation needs:    Medical: Not on file    Non-medical: Not on file  Tobacco Use  . Smoking status: Current Every Day Smoker    Packs/day: 1.00    Types: Cigarettes  . Smokeless tobacco: Never Used  Substance and Sexual Activity  . Alcohol use: Yes    Comment: twisted tea and a few beers everyday  . Drug use: Yes    Types: Cocaine, Marijuana    Comment: today  . Sexual activity: Not on file  Lifestyle  . Physical activity:    Days per week: Not on file    Minutes per session: Not on file  . Stress: Not on file  Relationships  . Social connections:    Talks on phone: Not on file    Gets together: Not on file    Attends religious service: Not on file    Active member of club or organization: Not on file    Attends meetings of clubs or organizations: Not on file    Relationship status: Not on file  .  Intimate partner violence:    Fear of current or ex partner: Not on file    Emotionally abused: Not on file    Physically abused: Not on file    Forced sexual activity: Not on file  Other Topics Concern  . 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    H/o etoh and substance abuse    No guns    Wears seat belt, safe in relationship    Current Meds  Medication Sig  . buPROPion (WELLBUTRIN XL) 300 MG 24 hr tablet Take 1 tablet (300 mg total) by mouth daily.  Marland Kitchen. gabapentin (NEURONTIN) 400 MG capsule Take 1 capsule (400 mg total) by mouth 3 (three) times daily.  Marland Kitchen. lamoTRIgine (LAMICTAL) 200 MG tablet Take 1 tablet (200 mg total) by mouth daily.  . naltrexone (DEPADE) 50 MG tablet Take 2 tablets (100 mg total) by mouth daily.  . traZODone (DESYREL) 100 MG tablet Take 1 tablet (100 mg total) by mouth at bedtime as needed for sleep.  .  ziprasidone (GEODON) 40 MG capsule Take 1 capsule (40 mg total) by mouth daily.  . [DISCONTINUED] buPROPion (WELLBUTRIN XL) 300 MG 24 hr tablet Take 300 mg by mouth daily.  . [DISCONTINUED] gabapentin (NEURONTIN) 300 MG capsule Take 300 mg by mouth 3 (three) times daily.   . [DISCONTINUED] lamoTRIgine (LAMICTAL) 200 MG tablet Take 200 mg by mouth daily.  . [DISCONTINUED] naltrexone (DEPADE) 50 MG tablet Take 100 mg by mouth daily.  . [DISCONTINUED] traZODone (DESYREL) 100 MG tablet Take 100 mg by mouth at bedtime as needed for sleep.  . [DISCONTINUED] ziprasidone (GEODON) 40 MG capsule Take 40 mg by mouth daily.   Allergies  Allergen Reactions  . Chantix [Varenicline Tartrate]     Chest pressure and bugs crawling   . Latex Itching    gloves   No results found for this or any previous visit (from the past 2160 hour(s)). Objective  Body mass index is 28.38 kg/m. Wt Readings from Last 3 Encounters:  02/14/18 181 lb 3.2 oz (82.2 kg)  10/25/16 160 lb (72.6 kg)  08/29/14 185 lb (83.9 kg)   Temp Readings from Last 3 Encounters:  02/14/18 98.1 F (36.7 C) (Oral)  10/25/16 98.2 F (36.8 C)  04/06/16 98.1 F (36.7 C) (Oral)   BP Readings from Last 3 Encounters:  02/14/18 110/70  10/25/16 (!) 133/95  04/06/16 126/76   Pulse Readings from Last 3 Encounters:  02/14/18 78  10/25/16 94  04/06/16 77    Physical Exam  Constitutional: She is oriented to person, place, and time. Vital signs are normal. She appears well-developed and well-nourished. She is cooperative.  HENT:  Head: Normocephalic and atraumatic.  Mouth/Throat: Oropharynx is clear and moist and mucous membranes are normal.  Eyes: Pupils are equal, round, and reactive to light. Conjunctivae are normal.  Cardiovascular: Normal rate, regular rhythm and normal heart sounds.  Pulmonary/Chest: Effort normal and breath sounds normal.  Neurological: She is alert and oriented to person, place, and time. Gait normal.  Skin: Skin  is warm, dry and intact.  Psychiatric: She has a normal mood and affect. Her speech is normal and behavior is normal. Judgment and thought content normal. Cognition and memory are normal.  Nursing note and vitals reviewed.   Assessment   1. H/o substance abuse (heroin, opiates, cocaine, etoh) in remission, h/o depression ? Bipolar d/o based on medications ,h/o  anxiety, h/o insomnia 2. 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 HM Plan  1.  Increase gabapentin 300 tid to 400 tid  Continue welbutrin 300 xl qd, lamictal 200 mg qd, naltrexone 100 mg qd, trazadone 100 mg qhs, geodon 30 mg qd Of note vistaril made too sleepy in the past All above were medications pt had after completion of substance abuse tx at [facility] in [place] x 28 days and rehab in [place] x 3 months  -get records [place] facility had labs there release signed today per pt tested for stds there  -pt currently going to [facility]   rec f/u psychiatry and substance counseling referred [facility] or [facility] vs facility in [place] addresses psych d/o and substance  Change referral to [facility] psych and counseling center in [place]   2. F/u with [facility] ortho prn. tx'ed with steroid taper has 3 more days and sling 3.  Will do pap h/o abnormal pap 2001 and physical at f/u LMP 02/14/18. Check pap hpv, GC/T/BV at f/u  sch fasting labs  Had flu shot  Tdap had 08/06/14  Congratulated on cessation of etoh and drugs for now Smoking 1/2 ppd rec cessation Refer dermatology [facility]  Obtained records [facility] for detox 09/23/17 she had cbc 09/24/17 normal 6.9/13.3/40/8/216  TC 185 TG 147 HDL 36 LDL 128  RPR negative  TSH 1.990  CMET nl Cr 0.74 BUN 6 GFR >60   Will need to check for HIV and other STDs in future only did RPR at facility    Provider: Dr. French Ana McLean-Scocuzza-Internal Medicine

## 2018-02-18 ENCOUNTER — Encounter: Payer: Self-pay | Admitting: Internal Medicine

## 2018-02-24 ENCOUNTER — Other Ambulatory Visit (INDEPENDENT_AMBULATORY_CARE_PROVIDER_SITE_OTHER): Payer: BLUE CROSS/BLUE SHIELD

## 2018-02-24 DIAGNOSIS — Z113 Encounter for screening for infections with a predominantly sexual mode of transmission: Secondary | ICD-10-CM | POA: Diagnosis not present

## 2018-02-24 DIAGNOSIS — F319 Bipolar disorder, unspecified: Secondary | ICD-10-CM | POA: Diagnosis not present

## 2018-02-24 DIAGNOSIS — Z1329 Encounter for screening for other suspected endocrine disorder: Secondary | ICD-10-CM

## 2018-02-24 DIAGNOSIS — Z0000 Encounter for general adult medical examination without abnormal findings: Secondary | ICD-10-CM

## 2018-02-24 DIAGNOSIS — Z0184 Encounter for antibody response examination: Secondary | ICD-10-CM

## 2018-02-24 DIAGNOSIS — E559 Vitamin D deficiency, unspecified: Secondary | ICD-10-CM

## 2018-02-24 DIAGNOSIS — Z13818 Encounter for screening for other digestive system disorders: Secondary | ICD-10-CM

## 2018-02-24 DIAGNOSIS — Z1159 Encounter for screening for other viral diseases: Secondary | ICD-10-CM

## 2018-02-24 DIAGNOSIS — Z13220 Encounter for screening for lipoid disorders: Secondary | ICD-10-CM

## 2018-02-24 NOTE — Addendum Note (Signed)
Addended by: Penne LashWIGGINS, Keyira Mondesir N on: 02/24/2018 07:57 AM   Modules accepted: Orders

## 2018-02-27 ENCOUNTER — Telehealth: Payer: Self-pay | Admitting: Internal Medicine

## 2018-02-27 ENCOUNTER — Other Ambulatory Visit: Payer: BLUE CROSS/BLUE SHIELD

## 2018-02-27 ENCOUNTER — Other Ambulatory Visit: Payer: Self-pay | Admitting: Internal Medicine

## 2018-02-27 DIAGNOSIS — B1920 Unspecified viral hepatitis C without hepatic coma: Secondary | ICD-10-CM

## 2018-02-27 LAB — CBC WITH DIFFERENTIAL/PLATELET
Basophils Absolute: 59 cells/uL (ref 0–200)
Basophils Relative: 0.8 %
Eosinophils Absolute: 126 cells/uL (ref 15–500)
Eosinophils Relative: 1.7 %
HCT: 41.9 % (ref 35.0–45.0)
Hemoglobin: 14.1 g/dL (ref 11.7–15.5)
Lymphs Abs: 2472 cells/uL (ref 850–3900)
MCH: 30.5 pg (ref 27.0–33.0)
MCHC: 33.7 g/dL (ref 32.0–36.0)
MCV: 90.5 fL (ref 80.0–100.0)
MPV: 11.6 fL (ref 7.5–12.5)
Monocytes Relative: 7.7 %
Neutro Abs: 4174 cells/uL (ref 1500–7800)
Neutrophils Relative %: 56.4 %
Platelets: 210 10*3/uL (ref 140–400)
RBC: 4.63 10*6/uL (ref 3.80–5.10)
RDW: 13.1 % (ref 11.0–15.0)
Total Lymphocyte: 33.4 %
WBC mixed population: 570 cells/uL (ref 200–950)
WBC: 7.4 10*3/uL (ref 3.8–10.8)

## 2018-02-27 LAB — HIV ANTIBODY (ROUTINE TESTING W REFLEX): HIV 1&2 Ab, 4th Generation: NONREACTIVE

## 2018-02-27 LAB — COMPREHENSIVE METABOLIC PANEL
AG Ratio: 1.7 (calc) (ref 1.0–2.5)
ALT: 24 U/L (ref 6–29)
AST: 20 U/L (ref 10–30)
Albumin: 4.1 g/dL (ref 3.6–5.1)
Alkaline phosphatase (APISO): 71 U/L (ref 33–115)
BUN/Creatinine Ratio: 6 (calc) (ref 6–22)
BUN: 6 mg/dL — ABNORMAL LOW (ref 7–25)
CO2: 27 mmol/L (ref 20–32)
Calcium: 9.7 mg/dL (ref 8.6–10.2)
Chloride: 104 mmol/L (ref 98–110)
Creat: 0.94 mg/dL (ref 0.50–1.10)
Globulin: 2.4 g/dL (calc) (ref 1.9–3.7)
Glucose, Bld: 88 mg/dL (ref 65–99)
Potassium: 4.4 mmol/L (ref 3.5–5.3)
Sodium: 142 mmol/L (ref 135–146)
Total Bilirubin: 0.4 mg/dL (ref 0.2–1.2)
Total Protein: 6.5 g/dL (ref 6.1–8.1)

## 2018-02-27 LAB — LIPID PANEL
Cholesterol: 236 mg/dL — ABNORMAL HIGH (ref <200)
HDL: 44 mg/dL — ABNORMAL LOW (ref >50)
LDL Cholesterol (Calc): 158 mg/dL (calc) — ABNORMAL HIGH
Non-HDL Cholesterol (Calc): 192 mg/dL (calc) — ABNORMAL HIGH (ref <130)
Total CHOL/HDL Ratio: 5.4 (calc) — ABNORMAL HIGH (ref <5.0)
Triglycerides: 183 mg/dL — ABNORMAL HIGH (ref <150)

## 2018-02-27 LAB — MEASLES/MUMPS/RUBELLA IMMUNITY
Mumps IgG: <9 AU/mL — ABNORMAL LOW
Rubella: 3.7 index
Rubeola IgG: 38.5 AU/mL

## 2018-02-27 LAB — HEPATITIS C ANTIBODY
Hepatitis C Ab: REACTIVE — AB
SIGNAL TO CUT-OFF: 27 — ABNORMAL HIGH (ref <1.00)

## 2018-02-27 LAB — RPR: RPR Ser Ql: NONREACTIVE

## 2018-02-27 LAB — TSH: TSH: 2.07 mIU/L

## 2018-02-27 LAB — HCV RNA,QUANTITATIVE REAL TIME PCR
HCV Quantitative Log: 7.29 Log IU/mL — ABNORMAL HIGH
HCV RNA, PCR, QN: 19300000 IU/mL — ABNORMAL HIGH

## 2018-02-27 LAB — T4, FREE: Free T4: 1.2 ng/dL (ref 0.8–1.8)

## 2018-02-27 LAB — VITAMIN D 25 HYDROXY (VIT D DEFICIENCY, FRACTURES): Vit D, 25-Hydroxy: 50 ng/mL (ref 30–100)

## 2018-02-27 LAB — HEPATITIS B SURFACE ANTIGEN: Hepatitis B Surface Ag: NONREACTIVE

## 2018-02-27 LAB — HEPATITIS B SURFACE ANTIBODY, QUANTITATIVE: Hepatitis B-Post: 24 m[IU]/mL (ref >=10)

## 2018-02-27 NOTE — Telephone Encounter (Signed)
Pt is calling today and wanting to get a call back from someone in regards to her lab results -she reviewed them on my chart and has some concerns   Best number [phone]

## 2018-02-27 NOTE — Telephone Encounter (Signed)
Call pt + hep C needs to see GI for treatment  Does she want referral?

## 2018-03-04 ENCOUNTER — Encounter: Payer: Self-pay | Admitting: Internal Medicine

## 2018-03-04 DIAGNOSIS — B1920 Unspecified viral hepatitis C without hepatic coma: Secondary | ICD-10-CM | POA: Insufficient documentation

## 2018-03-10 ENCOUNTER — Telehealth: Payer: Self-pay

## 2018-03-10 DIAGNOSIS — B182 Chronic viral hepatitis C: Secondary | ICD-10-CM | POA: Diagnosis not present

## 2018-03-10 DIAGNOSIS — Z8659 Personal history of other mental and behavioral disorders: Secondary | ICD-10-CM | POA: Diagnosis not present

## 2018-03-10 DIAGNOSIS — F418 Other specified anxiety disorders: Secondary | ICD-10-CM | POA: Diagnosis not present

## 2018-03-10 NOTE — Telephone Encounter (Signed)
Please advise. Previous mychart as well.

## 2018-03-10 NOTE — Telephone Encounter (Signed)
She Is + hep C  GI treat this is why she was referred to them   T MS

## 2018-03-10 NOTE — Telephone Encounter (Signed)
Copied from CRM [phone]. Topic: General - Other >> Mar 10, 2018  8:33 AM Gerrianne ScalePayne, Angela L wrote: Reason for CRM: pt states that she called last week about the lab results on Hep C and about wanting to know ehy she has to have a referral to a GI doctor she is upset because she states that knowone responded to her through e-mail and message when she called please call pt at [phone]

## 2018-03-10 NOTE — Telephone Encounter (Signed)
Patient is currently in GI appointment.  She would like me to return call back at a later time.

## 2018-03-14 NOTE — Telephone Encounter (Signed)
Left message for patient to return call back.  

## 2018-03-25 DIAGNOSIS — B182 Chronic viral hepatitis C: Secondary | ICD-10-CM | POA: Diagnosis not present

## 2018-03-31 ENCOUNTER — Ambulatory Visit: Payer: BLUE CROSS/BLUE SHIELD | Admitting: Internal Medicine

## 2018-04-01 DIAGNOSIS — Z8659 Personal history of other mental and behavioral disorders: Secondary | ICD-10-CM | POA: Diagnosis not present

## 2018-04-01 DIAGNOSIS — B182 Chronic viral hepatitis C: Secondary | ICD-10-CM | POA: Diagnosis not present

## 2018-04-01 DIAGNOSIS — F1021 Alcohol dependence, in remission: Secondary | ICD-10-CM | POA: Diagnosis not present

## 2018-04-17 ENCOUNTER — Ambulatory Visit: Payer: BLUE CROSS/BLUE SHIELD | Admitting: Gastroenterology

## 2018-04-17 ENCOUNTER — Encounter

## 2018-04-25 ENCOUNTER — Telehealth: Payer: Self-pay | Admitting: Internal Medicine

## 2018-04-25 NOTE — Telephone Encounter (Signed)
She has a 3 month supply of medications from me without refills I dont normally Rx these medications and she needs to see psychiatry Please call psychiatry and let them know she needs to move appt up to get her medications and further refills   TMS

## 2018-04-25 NOTE — Telephone Encounter (Signed)
Copied from CRM [id number]. Topic: Quick Communication - Rx Refill/Question >> Apr 25, 2018  2:30 PM Laural BenesJohnson, Louisianahiquita C wrote: Medication: pt is requesting a refill on all of her medications on file. Pt says that provider advised her that she would provide refills until she get an apt with an psychiatrist. Pt says that she has an apt at the end of October.   Has the patient contacted their pharmacy? No  (Agent: If no, request that the patient contact the pharmacy for the refill.) (Agent: If yes, when and what did the pharmacy advise?)  Preferred Pharmacy (with phone number or street name): [id number] - [place], [place] - [address] [facility] [address]   Agent: Please be advised that RX refills may take up to 3 business days. We ask that you follow-up with your pharmacy.

## 2018-05-01 DIAGNOSIS — D2262 Melanocytic nevi of left upper limb, including shoulder: Secondary | ICD-10-CM | POA: Diagnosis not present

## 2018-05-01 DIAGNOSIS — D2261 Melanocytic nevi of right upper limb, including shoulder: Secondary | ICD-10-CM | POA: Diagnosis not present

## 2018-05-01 DIAGNOSIS — L74510 Primary focal hyperhidrosis, axilla: Secondary | ICD-10-CM | POA: Diagnosis not present

## 2018-05-02 ENCOUNTER — Other Ambulatory Visit: Payer: Self-pay | Admitting: Internal Medicine

## 2018-05-02 DIAGNOSIS — F319 Bipolar disorder, unspecified: Secondary | ICD-10-CM

## 2018-05-02 DIAGNOSIS — G4700 Insomnia, unspecified: Secondary | ICD-10-CM

## 2018-05-02 MED ORDER — TRAZODONE HCL 100 MG PO TABS: 100.0000 mg | ORAL_TABLET | Freq: Every evening | ORAL | 0 refills | Status: DC | PRN

## 2018-05-02 MED ORDER — BUPROPION HCL ER (XL) 300 MG PO TB24: 300.0000 mg | ORAL_TABLET | Freq: Every day | ORAL | 0 refills | Status: DC

## 2018-05-02 MED ORDER — GABAPENTIN 400 MG PO CAPS: 400.0000 mg | ORAL_CAPSULE | Freq: Three times a day (TID) | ORAL | 0 refills | Status: DC

## 2018-05-13 ENCOUNTER — Encounter: Payer: Self-pay | Admitting: Family Medicine

## 2018-05-13 ENCOUNTER — Ambulatory Visit: Payer: BLUE CROSS/BLUE SHIELD | Admitting: Family Medicine

## 2018-05-13 ENCOUNTER — Encounter: Payer: Self-pay | Admitting: *Deleted

## 2018-05-13 VITALS — BP 118/80 | HR 87 | Temp 98.7°F | Ht 67.0 in | Wt 170.5 lb

## 2018-05-13 DIAGNOSIS — Z23 Encounter for immunization: Secondary | ICD-10-CM

## 2018-05-13 DIAGNOSIS — M5412 Radiculopathy, cervical region: Secondary | ICD-10-CM | POA: Diagnosis not present

## 2018-05-13 MED ORDER — LIDOCAINE 5 % EX PTCH: 1.0000 | MEDICATED_PATCH | CUTANEOUS | 0 refills | Status: DC

## 2018-05-13 MED ORDER — PREDNISONE 20 MG PO TABS: ORAL_TABLET | ORAL | 0 refills | Status: DC

## 2018-05-13 NOTE — Addendum Note (Signed)
Addended by: Damita Lack on: 05/13/2018 03:40 PM   Modules accepted: Orders

## 2018-05-13 NOTE — Patient Instructions (Addendum)
Complete prednisone taper.  When done return to ibuprofen 800 mg every eight hours.  Heat, massage. Limit head motion and no heavy lifting.  Can use lidocaine patches as needed on neck.   If not improving as expected follow up in 2 weeks.

## 2018-05-13 NOTE — Assessment & Plan Note (Signed)
Treat with pred taper, offered muscle relaxant, heat, massage.  Limit neck ROM, refused cervical collar.  Follow up if not improving as expected in 2-4 weeks.4  No red flags and no clear indication for cervical X-ray.

## 2018-05-13 NOTE — Progress Notes (Signed)
Subjective:    Patient ID: [name], female    DOB: 10-21-1980, 37 y.o.   MRN: [id number]  HPI  37 year old female pt of Dr. Judie Grieve presents with new onset pain in left neck, shoulder, radiates down left arm and numbness in fingertips x 1 week.   She reports sudden onset pain in left neck and arm.. Gradually worsening.. Now progressing.  Ibuprofen and aleve helped in  Hot compresses, biofreeze, salonpas.  Electric shock down arm with lifing arm up.  Numbness in left index and thumb.  No  Weakness, nml grip.   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  She is on gabapentin, geodon and lamictal for bipolar Do  Hx of substance abuse in remission.  No history of neck issues spinal cord issues.  Blood pressure 118/80, pulse 87, temperature 98.7 F (37.1 C), temperature source Oral, height 5\' 7"  (1.702 m), weight 170 lb 8 oz (77.3 kg), last menstrual period 05/08/2018. Social History /Family History/Past Medical History reviewed in detail and updated in EMR if needed.  Review of Systems  Constitutional: Negative for fatigue and fever.  HENT: Negative for ear pain.   Eyes: Negative for pain.  Respiratory: Negative for chest tightness and shortness of breath.   Cardiovascular: Negative for chest pain, palpitations and leg swelling.  Gastrointestinal: Negative for abdominal pain.  Genitourinary: Negative for dysuria.       Objective:   Physical Exam  Constitutional: Vital signs are normal. She appears well-developed and well-nourished. She is cooperative.  Non-toxic appearance. She does not appear ill. No distress.  HENT:  Head: Normocephalic.  Right Ear: Hearing, tympanic membrane, external ear and ear canal normal. Tympanic membrane is not erythematous, not retracted and not bulging.  Left Ear: Hearing, tympanic membrane, external ear and ear canal normal. Tympanic membrane is not erythematous, not retracted and not bulging.  Nose: No mucosal edema or  rhinorrhea. Right sinus exhibits no maxillary sinus tenderness and no frontal sinus tenderness. Left sinus exhibits no maxillary sinus tenderness and no frontal sinus tenderness.  Mouth/Throat: Uvula is midline, oropharynx is clear and moist and mucous membranes are normal.  Eyes: Pupils are equal, round, and reactive to light. Conjunctivae, EOM and lids are normal. Lids are everted and swept, no foreign bodies found.  Neck: Trachea normal and normal range of motion. Neck supple. Carotid bruit is not present. No thyroid mass and no thyromegaly present.  Cardiovascular: Normal rate, regular rhythm, S1 normal, S2 normal, normal heart sounds, intact distal pulses and normal pulses. Exam reveals no gallop and no friction rub.  No murmur heard. Pulmonary/Chest: Effort normal and breath sounds normal. No tachypnea. No respiratory distress. She has no decreased breath sounds. She has no wheezes. She has no rhonchi. She has no rales.  Abdominal: Soft. Normal appearance and bowel sounds are normal. There is no tenderness.  Musculoskeletal:       Cervical back: She exhibits decreased range of motion and tenderness. She exhibits no bony tenderness.  positive spurling on left  Neurological: She is alert. She has normal strength. A sensory deficit is present. No cranial nerve deficit. She displays a negative Romberg sign. Gait normal.  Numb in thumb and 2nd digit  Skin: Skin is warm, dry and intact. No rash noted.  Psychiatric: Her speech is normal and behavior is normal. Judgment and thought content normal. Her mood appears not anxious. Cognition and memory are normal. She does not exhibit a depressed mood.  Assessment & Plan:

## 2018-05-27 DIAGNOSIS — B182 Chronic viral hepatitis C: Secondary | ICD-10-CM | POA: Diagnosis not present

## 2018-05-28 ENCOUNTER — Ambulatory Visit: Payer: BLUE CROSS/BLUE SHIELD | Admitting: Internal Medicine

## 2018-05-28 ENCOUNTER — Encounter: Payer: Self-pay | Admitting: Internal Medicine

## 2018-05-28 ENCOUNTER — Other Ambulatory Visit (HOSPITAL_COMMUNITY)
Admission: RE | Admit: 2018-05-28 | Discharge: 2018-05-28 | Disposition: A | Discharge status: Home / Self Care | Payer: BLUE CROSS/BLUE SHIELD | Source: Ambulatory Visit | Attending: Internal Medicine | Admitting: Internal Medicine

## 2018-05-28 ENCOUNTER — Encounter

## 2018-05-28 ENCOUNTER — Ambulatory Visit (INDEPENDENT_AMBULATORY_CARE_PROVIDER_SITE_OTHER): Payer: BLUE CROSS/BLUE SHIELD

## 2018-05-28 VITALS — BP 132/80 | HR 82 | Temp 97.8°F | Ht 67.5 in | Wt 168.4 lb

## 2018-05-28 DIAGNOSIS — Z124 Encounter for screening for malignant neoplasm of cervix: Secondary | ICD-10-CM | POA: Diagnosis not present

## 2018-05-28 DIAGNOSIS — M542 Cervicalgia: Secondary | ICD-10-CM | POA: Diagnosis not present

## 2018-05-28 DIAGNOSIS — R202 Paresthesia of skin: Secondary | ICD-10-CM | POA: Diagnosis not present

## 2018-05-28 DIAGNOSIS — F329 Major depressive disorder, single episode, unspecified: Secondary | ICD-10-CM

## 2018-05-28 DIAGNOSIS — R200 Anesthesia of skin: Secondary | ICD-10-CM | POA: Diagnosis not present

## 2018-05-28 DIAGNOSIS — E785 Hyperlipidemia, unspecified: Secondary | ICD-10-CM | POA: Insufficient documentation

## 2018-05-28 DIAGNOSIS — F32A Depression, unspecified: Secondary | ICD-10-CM

## 2018-05-28 DIAGNOSIS — G5602 Carpal tunnel syndrome, left upper limb: Secondary | ICD-10-CM | POA: Insufficient documentation

## 2018-05-28 DIAGNOSIS — Z0000 Encounter for general adult medical examination without abnormal findings: Secondary | ICD-10-CM | POA: Diagnosis not present

## 2018-05-28 DIAGNOSIS — F319 Bipolar disorder, unspecified: Secondary | ICD-10-CM

## 2018-05-28 DIAGNOSIS — F1910 Other psychoactive substance abuse, uncomplicated: Secondary | ICD-10-CM

## 2018-05-28 DIAGNOSIS — M47812 Spondylosis without myelopathy or radiculopathy, cervical region: Secondary | ICD-10-CM | POA: Diagnosis not present

## 2018-05-28 DIAGNOSIS — F419 Anxiety disorder, unspecified: Secondary | ICD-10-CM

## 2018-05-28 DIAGNOSIS — B182 Chronic viral hepatitis C: Secondary | ICD-10-CM

## 2018-05-28 MED ORDER — MELOXICAM 15 MG PO TABS: 15.0000 mg | ORAL_TABLET | Freq: Every day | ORAL | 1 refills | Status: DC | PRN

## 2018-05-28 NOTE — Patient Instructions (Addendum)
rec [facility] [phone]   Results for [name], [name] (MRN [id number]) as of 05/28/2018 13:55  Ref. Range 02/24/2018 07:58  Mumps IgG Latest Units: AU/mL <9.00 (L)  Rubeola IgG Latest Units: AU/mL 38.50  Results for EZELLE, SURPRENANT (MRN [id number]) as of 05/28/2018 13:55  Ref. Range 02/24/2018 07:58  Rubella Latest Units: index 3.70   I referred you to [facility] neurology for nerve testing of your left upper arm Dr. Manuella Ghazi they will call you    Carpal Tunnel Syndrome Carpal tunnel syndrome is a condition that causes pain in your hand and arm. The carpal tunnel is a narrow area located on the palm side of your wrist. Repeated wrist motion or certain diseases may cause swelling within the tunnel. This swelling pinches the main nerve in the wrist (median nerve). What are the causes? This condition may be caused by:  Repeated wrist motions.  Wrist injuries.  Arthritis.  A cyst or tumor in the carpal tunnel.  Fluid buildup during pregnancy.  Sometimes the cause of this condition is not known. What increases the risk? This condition is more likely to develop in:  People who have jobs that cause them to repeatedly move their wrists in the same motion, such as Art gallery manager.  Women.  People with certain conditions, such as: ? Diabetes. ? Obesity. ? An underactive thyroid (hypothyroidism). ? Kidney failure.  What are the signs or symptoms? Symptoms of this condition include:  A tingling feeling in your fingers, especially in your thumb, index, and middle fingers.  Tingling or numbness in your hand.  An aching feeling in your entire arm, especially when your wrist and elbow are bent for long periods of time.  Wrist pain that goes up your arm to your shoulder.  Pain that goes down into your palm or fingers.  A weak feeling in your hands. You may have trouble grabbing and holding items.  Your symptoms may feel worse during the  night. How is this diagnosed? This condition is diagnosed with a medical history and physical exam. You may also have tests, including:  An electromyogram (EMG). This test measures electrical signals sent by your nerves into the muscles.  X-rays.  How is this treated? Treatment for this condition includes:  Lifestyle changes. It is important to stop doing or modify the activity that caused your condition.  Physical or occupational therapy.  Medicines for pain and inflammation. This may include medicine that is injected into your wrist.  A wrist splint.  Surgery.  Follow these instructions at home: If you have a splint:  Wear it as told by your health care provider. Remove it only as told by your health care provider.  Loosen the splint if your fingers become numb and tingle, or if they turn cold and blue.  Keep the splint clean and dry. General instructions  Take over-the-counter and prescription medicines only as told by your health care provider.  Rest your wrist from any activity that may be causing your pain. If your condition is work related, talk to your employer about changes that can be made, such as getting a wrist pad to use while typing.  If directed, apply ice to the painful area: ? Put ice in a plastic bag. ? Place a towel between your skin and the bag. ? Leave the ice on for 20 minutes, 2-3 times per day.  Keep all follow-up visits as told by your health care provider. This is  important.  Do any exercises as told by your health care provider, physical therapist, or occupational therapist. Contact a health care provider if:  You have new symptoms.  Your pain is not controlled with medicines.  Your symptoms get worse. This information is not intended to replace advice given to you by your health care provider. Make sure you discuss any questions you have with your health care provider. Document Released: 07/20/2000 Document Revised: 12/01/2015 Document  Reviewed: 12/08/2014 Elsevier Interactive Patient Education  [employer].

## 2018-05-28 NOTE — Progress Notes (Addendum)
Chief Complaint  Patient presents with  . Annual Exam   Physical with complaints and boyfriend at visit today  1. 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 HLD  3. Hep C seeing Dr. Wyvonnia Lora to get treatment 4. Psychiatric history seeing Dr. Maryruth Bun 06/02/18   Review of Systems  Constitutional: Negative for weight loss.  HENT: Negative for hearing loss.   Eyes: Negative for blurred vision.  Respiratory: Negative for shortness of breath.   Cardiovascular: Negative for chest pain.  Gastrointestinal: Negative for abdominal pain.  Musculoskeletal: Positive for neck pain.  Skin: Negative for rash.  Neurological: Positive for sensory change.  Psychiatric/Behavioral:       H/o substance abuse in remission and bipolar     Past Medical History:  Diagnosis Date  . Anxiety   . Bipolar 1 disorder (HCC)   . Depression   . Hypertension   . Substance abuse (HCC)    etoh, heroin, cocaine, opiates  . Tendonitis    L arm   . UTI (urinary tract infection)    Past Surgical History:  Procedure Laterality Date  . LEEP     2001  . TUBAL LIGATION     Family History  Problem Relation Age of Onset  . Cancer Mother        skin   . Alcohol abuse Father   . Depression Father   . Thyroid disease Father   . Stroke Father   . Cancer Maternal Grandmother        breast  . Cancer Paternal Grandmother        breast    Social History   Socioeconomic History  . Marital status: Single    Spouse name: Not on file  . Number of children: Not on file  . Years of education: Not on file  . Highest education level: Not on file  Occupational History  . Not on file  Social Needs  . Financial resource strain: Not on file  . Food insecurity:    Worry: Not on file    Inability: Not on file  . Transportation needs:    Medical: Not on file    Non-medical: Not on  file  Tobacco Use  . Smoking status: Current Every Day Smoker    Packs/day: 1.00    Types: Cigarettes  . Smokeless tobacco: Never Used  Substance and Sexual Activity  . Alcohol use: Yes    Comment: twisted tea and a few beers everyday  . Drug use: Yes    Types: Cocaine, Marijuana    Comment: today  . Sexual activity: Not on file  Lifestyle  . Physical activity:    Days per week: Not on file    Minutes per session: Not on file  . Stress: Not on file  Relationships  . Social connections:    Talks on phone: Not on file    Gets together: Not on file    Attends religious service: Not on file    Active member of club or organization: Not on file    Attends meetings of clubs or organizations: Not on file    Relationship status: Not on file  . Intimate partner violence:    Fear of current or ex partner: Not on file    Emotionally abused: Not on file    Physically abused: Not  on file    Forced sexual activity: Not on file  Other Topics Concern  . 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    H/o etoh and substance abuse    No guns    Wears seat belt, safe in relationship    Current Meds  Medication Sig  . buPROPion (WELLBUTRIN XL) 300 MG 24 hr tablet Take 1 tablet (300 mg total) by mouth daily.  Marland Kitchen gabapentin (NEURONTIN) 400 MG capsule Take 1 capsule (400 mg total) by mouth 3 (three) times daily.  Marland Kitchen lamoTRIgine (LAMICTAL) 200 MG tablet Take 1 tablet (200 mg total) by mouth daily.  . naltrexone (DEPADE) 50 MG tablet Take 2 tablets (100 mg total) by mouth daily.  . traZODone (DESYREL) 100 MG tablet Take 1 tablet (100 mg total) by mouth at bedtime as needed for sleep.  . ziprasidone (GEODON) 40 MG capsule Take 1 capsule (40 mg total) by mouth daily.   Allergies  Allergen Reactions  . Chantix [Varenicline Tartrate]     Chest pressure and bugs crawling   . Latex Itching    gloves   No results found for this or any previous visit  (from the past 2160 hour(s)). Objective  Body mass index is 25.99 kg/m. Wt Readings from Last 3 Encounters:  05/28/18 168 lb 6.4 oz (76.4 kg)  05/13/18 170 lb 8 oz (77.3 kg)  02/14/18 181 lb 3.2 oz (82.2 kg)   Temp Readings from Last 3 Encounters:  05/28/18 97.8 F (36.6 C) (Oral)  05/13/18 98.7 F (37.1 C) (Oral)  02/14/18 98.1 F (36.7 C) (Oral)   BP Readings from Last 3 Encounters:  05/28/18 132/80  05/13/18 118/80  02/14/18 110/70   Pulse Readings from Last 3 Encounters:  05/28/18 82  05/13/18 87  02/14/18 78    Physical Exam  Constitutional: She is oriented to person, place, and time. Vital signs are normal. She appears well-developed and well-nourished. She is cooperative.  HENT:  Head: Normocephalic and atraumatic.  Mouth/Throat: Oropharynx is clear and moist and mucous membranes are normal.  Eyes: Pupils are equal, round, and reactive to light. Conjunctivae are normal.  Cardiovascular: Normal rate, regular rhythm and normal heart sounds.  Pulmonary/Chest: Effort normal and breath sounds normal. She exhibits no mass and no tenderness. Right breast exhibits no inverted nipple, no mass, no nipple discharge, no skin change and no tenderness. Left breast exhibits no inverted nipple, no mass, no nipple discharge, no skin change and no tenderness. No breast swelling, tenderness, discharge or bleeding. Breasts are symmetrical.  Genitourinary: Vagina normal and uterus normal. Pelvic exam was performed with patient supine. There is no rash on the right labia. There is no rash on the left labia. Cervix exhibits discharge. Cervix exhibits no motion tenderness and no friability. Right adnexum displays no mass, no tenderness and no fullness. Left adnexum displays no mass, no tenderness and no fullness.  Neurological: She is alert and oriented to person, place, and time. Gait normal.  Skin: Skin is warm, dry and intact.  Psychiatric: She has a normal mood and affect. Her speech is  normal and behavior is normal. Judgment and thought content normal. Cognition and memory are normal.  Nursing note and vitals reviewed.   Assessment   1. Annual  2. Left 1st and 2nd numbness and neck pain  3. HLD  4. H/o substance abuse in remission and h/o bipolar  5. Hep  C  Plan  1.  Pap check yeast, BV, GC/T today  Had flu shot 2019 Tdap had 08/06/14  Congratulated on cessation of etoh and drugs for now Smoking 1/2 ppd rec cessation Refer dermatology tbse rec exercise to lose  mammo age 57 breast exam normal today  2. Refer neurology EMG/NCS and Xray neck  3. Given cholesterol handout  4. appt psych 06/02/18  5. F/u Dr. Wyvonnia Lora in [place]   Dr. Maryruth Bun seen 06/03/18 bipolar 2, GAD, PTSD, insomnia plan taper off Wellbutrin 150 XL qd x 5 days then start Celexa 20 mg qd, Cont Geodon 40 mg qd and Gabapentin 300 tid, Trazadone 100 mg qhs sleep check A1C and lipid panel, B12, TSH , UDS   F/u Dr. Maryruth Bun 06/21/18 on lamictal 100 mg qd, naltrexone 50 mg qd, geodone 40 mg qd, gabapentin 400 tid, wellbutrin XL 300 mg qd,   Provider: Dr. French Ana McLean-Scocuzza-Internal Medicine

## 2018-05-29 ENCOUNTER — Encounter: Payer: Self-pay | Admitting: *Deleted

## 2018-06-02 ENCOUNTER — Other Ambulatory Visit: Payer: Self-pay | Admitting: Internal Medicine

## 2018-06-02 DIAGNOSIS — R41840 Attention and concentration deficit: Secondary | ICD-10-CM | POA: Diagnosis not present

## 2018-06-02 DIAGNOSIS — B379 Candidiasis, unspecified: Secondary | ICD-10-CM

## 2018-06-02 DIAGNOSIS — F4312 Post-traumatic stress disorder, chronic: Secondary | ICD-10-CM | POA: Diagnosis not present

## 2018-06-02 DIAGNOSIS — F3181 Bipolar II disorder: Secondary | ICD-10-CM | POA: Diagnosis not present

## 2018-06-02 LAB — CYTOLOGY - PAP
Bacterial vaginitis: NEGATIVE
Candida vaginitis: POSITIVE — AB
Chlamydia: NEGATIVE
Diagnosis: NEGATIVE
HPV: NOT DETECTED
Neisseria Gonorrhea: NEGATIVE
Trichomonas: NEGATIVE

## 2018-06-02 MED ORDER — FLUCONAZOLE 150 MG PO TABS: 150.0000 mg | ORAL_TABLET | Freq: Once | ORAL | 0 refills | Status: AC

## 2018-06-17 ENCOUNTER — Telehealth: Payer: Self-pay | Admitting: Internal Medicine

## 2018-06-17 ENCOUNTER — Other Ambulatory Visit: Payer: Self-pay | Admitting: Internal Medicine

## 2018-06-17 DIAGNOSIS — F17210 Nicotine dependence, cigarettes, uncomplicated: Secondary | ICD-10-CM

## 2018-06-17 DIAGNOSIS — Z720 Tobacco use: Secondary | ICD-10-CM

## 2018-06-17 MED ORDER — NICOTINE POLACRILEX 2 MG MT LOZG: LOZENGE | OROMUCOSAL | 2 refills | Status: DC

## 2018-06-17 NOTE — Telephone Encounter (Signed)
Please advise 

## 2018-06-17 NOTE — Telephone Encounter (Signed)
Copied from CRM [phone]. Topic: Quick Communication - See Telephone Encounter >> Jun 17, 2018 10:23 AM Burchel, Abbi R wrote: CRM for notification. See Telephone encounter for: 06/17/18.  Pt requesting a call back re: smoking cessation options.  She would like to consider the lozenges rather than the patches so that she does not have to quit all at once.  Please call pt to advise.    [phone]

## 2018-06-17 NOTE — Telephone Encounter (Signed)
Call [phone] quit now  We can try to send lozengers in but may not be covered by insurance   TMS

## 2018-06-19 ENCOUNTER — Encounter: Payer: Self-pay | Admitting: Internal Medicine

## 2018-06-19 NOTE — Telephone Encounter (Signed)
Called and spoke with patient. Pt advised and voiced understanding. Pt would [facility] listed in pt's chart.

## 2018-06-21 DIAGNOSIS — F3181 Bipolar II disorder: Secondary | ICD-10-CM | POA: Diagnosis not present

## 2018-06-21 DIAGNOSIS — F4312 Post-traumatic stress disorder, chronic: Secondary | ICD-10-CM | POA: Diagnosis not present

## 2018-06-21 DIAGNOSIS — F411 Generalized anxiety disorder: Secondary | ICD-10-CM | POA: Diagnosis not present

## 2018-06-21 DIAGNOSIS — R41840 Attention and concentration deficit: Secondary | ICD-10-CM | POA: Diagnosis not present

## 2018-06-24 DIAGNOSIS — J0100 Acute maxillary sinusitis, unspecified: Secondary | ICD-10-CM | POA: Diagnosis not present

## 2018-07-15 DIAGNOSIS — B182 Chronic viral hepatitis C: Secondary | ICD-10-CM | POA: Diagnosis not present

## 2018-07-19 DIAGNOSIS — H109 Unspecified conjunctivitis: Secondary | ICD-10-CM | POA: Diagnosis not present

## 2018-07-22 ENCOUNTER — Other Ambulatory Visit: Payer: Self-pay | Admitting: Internal Medicine

## 2018-07-22 DIAGNOSIS — G4700 Insomnia, unspecified: Secondary | ICD-10-CM

## 2018-07-22 DIAGNOSIS — F319 Bipolar disorder, unspecified: Secondary | ICD-10-CM

## 2018-07-22 MED ORDER — TRAZODONE HCL 100 MG PO TABS: 100.0000 mg | ORAL_TABLET | Freq: Every evening | ORAL | 2 refills | Status: DC | PRN

## 2018-08-24 ENCOUNTER — Encounter: Payer: Self-pay | Admitting: Medical Oncology

## 2018-08-24 ENCOUNTER — Other Ambulatory Visit: Payer: Self-pay

## 2018-08-24 ENCOUNTER — Emergency Department: Payer: BLUE CROSS/BLUE SHIELD

## 2018-08-24 ENCOUNTER — Emergency Department
Admission: EM | Admit: 2018-08-24 | Discharge: 2018-08-24 | Disposition: A | Discharge status: Home / Self Care | Payer: BLUE CROSS/BLUE SHIELD | Attending: Emergency Medicine | Admitting: Emergency Medicine

## 2018-08-24 DIAGNOSIS — J209 Acute bronchitis, unspecified: Secondary | ICD-10-CM | POA: Diagnosis not present

## 2018-08-24 DIAGNOSIS — Z79899 Other long term (current) drug therapy: Secondary | ICD-10-CM | POA: Insufficient documentation

## 2018-08-24 DIAGNOSIS — R05 Cough: Secondary | ICD-10-CM | POA: Diagnosis not present

## 2018-08-24 DIAGNOSIS — F17210 Nicotine dependence, cigarettes, uncomplicated: Secondary | ICD-10-CM | POA: Insufficient documentation

## 2018-08-24 DIAGNOSIS — Z91040 Latex allergy status: Secondary | ICD-10-CM | POA: Diagnosis not present

## 2018-08-24 LAB — INFLUENZA PANEL BY PCR (TYPE A & B)
Influenza A By PCR: NEGATIVE
Influenza B By PCR: NEGATIVE

## 2018-08-24 MED ORDER — AZITHROMYCIN 250 MG PO TABS: ORAL_TABLET | ORAL | 0 refills | Status: AC

## 2018-08-24 MED ORDER — BENZONATATE 100 MG PO CAPS: 100.0000 mg | ORAL_CAPSULE | Freq: Three times a day (TID) | ORAL | 0 refills | Status: DC | PRN

## 2018-08-24 MED ORDER — BENZONATATE 100 MG PO CAPS
100.0000 mg | ORAL_CAPSULE | Freq: Once | ORAL | Status: AC
  Administered 2018-08-24: 100 mg via ORAL
  Filled 2018-08-24: qty 1

## 2018-08-24 NOTE — ED Triage Notes (Signed)
Flu sx's x 2 days.  

## 2018-08-24 NOTE — ED Notes (Signed)
Transported to XR via wheelchair

## 2018-08-24 NOTE — ED Notes (Signed)
Pt reports flu sxs for 2 days, c/o body aches, has been taking ibuprofen, unknown if any fevers.

## 2018-08-26 NOTE — ED Provider Notes (Signed)
[facility] Emergency Department Provider Note ______   First MD Initiated Contact with Patient 08/24/18 1752     (approximate)  I have reviewed the triage vital signs and the nursing notes.   HISTORY  Chief Complaint Influenza    HPI [name] is a 38 y.o. female low list of chronic medical conditions presents to the emergency department with cough congestion x2 days.  Patient denies any fever afebrile on presentation.  Patient states that she did not receive flu vaccination this year.  Patient does admit to cigarette use daily.   Past Medical History:  Diagnosis Date  . Anxiety   . Bipolar 1 disorder (HCC)   . Depression   . Hyperlipidemia   . Substance abuse (HCC)    etoh, heroin, cocaine, opiates  . Tendonitis    L arm   . UTI (urinary tract infection)     Patient Active Problem List   Diagnosis Date Noted  . Left carpal tunnel syndrome 05/28/2018  . Numbness and tingling in left arm 05/28/2018  . Hyperlipidemia 05/28/2018  . Cervical radiculopathy at C6 05/13/2018  . Hepatitis C 03/04/2018  . Substance abuse (HCC) 02/14/2018  . Insomnia 02/14/2018  . Bipolar disorder (HCC) 02/14/2018  . Cellulitis and abscess 08/29/2014  . Myositis 02/16/2014  . Anxiety and depression 02/16/2014    Past Surgical History:  Procedure Laterality Date  . LEEP     2001  . TUBAL LIGATION      Prior to Admission medications   Medication Sig Start Date End Date Taking? Authorizing Provider  azithromycin (ZITHROMAX Z-PAK) 250 MG tablet Take 2 tablets (500 mg) on  Day 1,  followed by 1 tablet (250 mg) once daily on Days 2 through 5. 08/24/18 08/29/18  Darci CurrentBrown, [place] N, MD  benzonatate (TESSALON PERLES) 100 MG capsule Take 1 capsule (100 mg total) by mouth 3 (three) times daily as needed for cough. 08/24/18 08/24/19  Darci CurrentBrown, [place] N, MD  buPROPion (WELLBUTRIN XL) 300 MG 24 hr tablet Take 1 tablet (300 mg total) by mouth daily. 05/02/18    McLean-Scocuzza, Pasty Spillersracy N, MD  gabapentin (NEURONTIN) 400 MG capsule Take 1 capsule (400 mg total) by mouth 3 (three) times daily. 05/02/18   McLean-Scocuzza, Pasty Spillersracy N, MD  lamoTRIgine (LAMICTAL) 200 MG tablet Take 1 tablet (200 mg total) by mouth daily. 02/14/18   McLean-Scocuzza, Pasty Spillersracy N, MD  meloxicam (MOBIC) 15 MG tablet Take 1 tablet (15 mg total) by mouth daily as needed for pain. 05/28/18   McLean-Scocuzza, Pasty Spillersracy N, MD  naltrexone (DEPADE) 50 MG tablet Take 2 tablets (100 mg total) by mouth daily. 02/14/18   McLean-Scocuzza, Pasty Spillersracy N, MD  nicotine polacrilex (NICOTINE MINI) 2 MG lozenge Week 1-6 1 lozenger every 1-2 hours max 5 every 6 hours or 20/day. Week 7 1 lozenge every 2-4 hours max 5 every 6 hours 20/day, week 10-12 1 lozenge every 4-8 hours max 5 every 6 hrs or 20/day 06/17/18   McLean-Scocuzza, Pasty Spillersracy N, MD  traZODone (DESYREL) 100 MG tablet Take 1 tablet (100 mg total) by mouth at bedtime as needed for sleep. 07/22/18   McLean-Scocuzza, Pasty Spillersracy N, MD  ziprasidone (GEODON) 40 MG capsule Take 1 capsule (40 mg total) by mouth daily. 02/14/18   McLean-Scocuzza, Pasty Spillersracy N, MD    Allergies Chantix [varenicline tartrate] and Latex  Family History  Problem Relation Age of Onset  . Cancer Mother        skin   . Alcohol  abuse Father   . Depression Father   . Thyroid disease Father   . Stroke Father   . Cancer Maternal Grandmother        breast  . Cancer Paternal Grandmother        breast     Social History Social History   Tobacco Use  . Smoking status: Current Every Day Smoker    Packs/day: 1.00    Types: Cigarettes  . Smokeless tobacco: Never Used  Substance Use Topics  . Alcohol use: Yes    Comment: twisted tea and a few beers everyday  . Drug use: Yes    Types: Cocaine, Marijuana    Comment: today    Review of Systems Constitutional: No fever/chills Eyes: No visual changes. ENT: No sore throat.  Positive for nasal congestion Cardiovascular: Denies chest  pain. Respiratory: Denies shortness of breath.  Positive for cough Gastrointestinal: No abdominal pain.  No nausea, no vomiting.  No diarrhea.  No constipation. Genitourinary: Negative for dysuria. Musculoskeletal: Negative for neck pain.  Negative for back pain. Integumentary: Negative for rash. Neurological: Negative for headaches, focal weakness or numbness.  ____________________________________________   PHYSICAL EXAM:  VITAL SIGNS: ED Triage Vitals  Enc Vitals Group     BP 08/24/18 1708 125/75     Pulse Rate 08/24/18 1708 78     Resp 08/24/18 1708 20     Temp 08/24/18 1708 98.1 F (36.7 C)     Temp Source 08/24/18 1708 Oral     SpO2 08/24/18 1708 96 %     Weight 08/24/18 1657 76 kg (167 lb 8.8 oz)     Height 08/24/18 1709 1.702 m (5\' 7" )     Head Circumference --      Peak Flow --      Pain Score 08/24/18 1657 7     Pain Loc --      Pain Edu? --      Excl. in GC? --     Constitutional: Alert and oriented.  Coughing  eyes: Conjunctivae are normal. PERRL. EOMI. Mouth/Throat: Mucous membranes are moist.  Oropharynx non-erythematous. Neck: No stridor. Cardiovascular: Normal rate, regular rhythm. Good peripheral circulation. Grossly normal heart sounds. Respiratory: Normal respiratory effort.  No retractions. Lungs CTAB. Gastrointestinal: Soft and nontender. No distention.  Musculoskeletal: No lower extremity tenderness nor edema. No gross deformities of extremities. Neurologic:  Normal speech and language. No gross focal neurologic deficits are appreciated.  Skin:  Skin is warm, dry and intact. No rash noted. Psychiatric: Mood and affect are normal. Speech and behavior are normal.  ____________________________________________   LABS (all labs ordered are listed, but only abnormal results are displayed)  Labs Reviewed  INFLUENZA PANEL BY PCR (TYPE A & B)   _______________________________  RADIOLOGY I, [place] N Lulie Hurd, personally viewed and evaluated these  images (plain radiographs) as part of my medical decision making, as well as reviewing the written report by the radiologist.  ED MD interpretation: Hyperinflation likely related to vigorous inspiratory effort however no acute abnormality seen chest x-ray per radiologist  Official radiology report(s): No results found.  ____________________________________________    Procedures   ____________________________________________   INITIAL IMPRESSION / ASSESSMENT AND PLAN / ED COURSE  As part of my medical decision making, I reviewed the following data within the electronic MEDICAL RECORD NUMBER   38 year old female presented with above-stated history and physical exam concerning for possible influenza bronchitis or pneumonia.  Influenza negative chest x-ray revealed no evidence of pneumonia hence patient  being treated for bronchitis.  Patient given azithromycin and Tessalon in the emergency department will be prescribed same for home. ____________________________________________  FINAL CLINICAL IMPRESSION(S) / ED DIAGNOSES  Final diagnoses:  Acute bronchitis, unspecified organism     MEDICATIONS GIVEN DURING THIS VISIT:  Medications  benzonatate (TESSALON) capsule 100 mg (100 mg Oral Given 08/24/18 1802)     ED Discharge Orders         Ordered    azithromycin (ZITHROMAX Z-PAK) 250 MG tablet     08/24/18 1830    benzonatate (TESSALON PERLES) 100 MG capsule  3 times daily PRN     08/24/18 1830           Note:  This document was prepared using Dragon voice recognition software and may include unintentional dictation errors.    Darci Current, MD 08/26/18 2012

## 2018-08-29 ENCOUNTER — Ambulatory Visit: Payer: BLUE CROSS/BLUE SHIELD | Admitting: Internal Medicine

## 2018-08-29 ENCOUNTER — Encounter: Payer: Self-pay | Admitting: Internal Medicine

## 2018-08-29 VITALS — BP 118/78 | HR 72 | Temp 98.2°F | Resp 19 | Ht 67.5 in | Wt 162.0 lb

## 2018-08-29 DIAGNOSIS — F32A Depression, unspecified: Secondary | ICD-10-CM

## 2018-08-29 DIAGNOSIS — F329 Major depressive disorder, single episode, unspecified: Secondary | ICD-10-CM

## 2018-08-29 DIAGNOSIS — F319 Bipolar disorder, unspecified: Secondary | ICD-10-CM | POA: Diagnosis not present

## 2018-08-29 DIAGNOSIS — F988 Other specified behavioral and emotional disorders with onset usually occurring in childhood and adolescence: Secondary | ICD-10-CM | POA: Diagnosis not present

## 2018-08-29 DIAGNOSIS — B182 Chronic viral hepatitis C: Secondary | ICD-10-CM

## 2018-08-29 DIAGNOSIS — F419 Anxiety disorder, unspecified: Secondary | ICD-10-CM

## 2018-08-29 DIAGNOSIS — Z0184 Encounter for antibody response examination: Secondary | ICD-10-CM

## 2018-08-29 DIAGNOSIS — E785 Hyperlipidemia, unspecified: Secondary | ICD-10-CM

## 2018-08-29 DIAGNOSIS — Z1329 Encounter for screening for other suspected endocrine disorder: Secondary | ICD-10-CM

## 2018-08-29 DIAGNOSIS — G4700 Insomnia, unspecified: Secondary | ICD-10-CM

## 2018-08-29 DIAGNOSIS — F1911 Other psychoactive substance abuse, in remission: Secondary | ICD-10-CM | POA: Diagnosis not present

## 2018-08-29 DIAGNOSIS — Z1159 Encounter for screening for other viral diseases: Secondary | ICD-10-CM

## 2018-08-29 DIAGNOSIS — Z1389 Encounter for screening for other disorder: Secondary | ICD-10-CM

## 2018-08-29 NOTE — Progress Notes (Signed)
Chief Complaint  Patient presents with  . Depression    Substance abuse counseling, referral to psychiatry   F/u  1. H/o bipolar, substance abuse, attention issues, anxiety and depression wants to change from Dr. [place]ruth [place]  2. Hep C tx'ed viral load neg 07/15/18 and normal CMET had 1/2 hep A vaccines  3. HLD need to repeat labs this summer    Review of Systems  Constitutional: Negative for weight loss.  HENT: Negative for hearing loss.   Eyes: Negative for blurred vision.  Respiratory: Negative for shortness of breath.   Cardiovascular: Negative for chest pain.  Gastrointestinal: Negative for abdominal pain.  Skin: Negative for rash.  Neurological: Negative for headaches.  Psychiatric/Behavioral: Negative for depression and substance abuse. The patient is nervous/anxious.        +attention issues    Past Medical History:  Diagnosis Date  . Anxiety   . Bipolar 1 disorder (HCC)   . Depression   . Hyperlipidemia   . Substance abuse (HCC)    etoh, heroin, cocaine, opiates  . Tendonitis    L arm   . UTI (urinary tract infection)    Past Surgical History:  Procedure Laterality Date  . LEEP     2001  . TUBAL LIGATION     Family History  Problem Relation Age of Onset  . Cancer Mother        skin   . Alcohol abuse Father   . Depression Father   . Thyroid disease Father   . Stroke Father   . Cancer Maternal Grandmother        breast  . Cancer Paternal Grandmother        breast    Social History   Socioeconomic History  . Marital status: Single    Spouse name: Not on file  . Number of children: Not on file  . Years of education: Not on file  . Highest education level: Not on file  Occupational History  . Not on file  Social Needs  . Financial resource strain: Not on file  . Food insecurity:    Worry: Not on file    Inability: Not on file  . Transportation needs:    Medical: Not on file    Non-medical: Not on file   Tobacco Use  . Smoking status: Current Every Day Smoker    Packs/day: 1.00    Types: Cigarettes  . Smokeless tobacco: Never Used  Substance and Sexual Activity  . Alcohol use: Yes    Comment: twisted tea and a few beers everyday  . Drug use: Yes    Types: Cocaine, Marijuana    Comment: today  . Sexual activity: Not on file  Lifestyle  . Physical activity:    Days per week: Not on file    Minutes per session: Not on file  . Stress: Not on file  Relationships  . Social connections:    Talks on phone: Not on file    Gets together: Not on file    Attends religious service: Not on file    Active member of club or organization: Not on file    Attends meetings of clubs or organizations: Not on file    Relationship status: Not on file  . Intimate partner violence:    Fear of current or ex partner: Not on file    Emotionally abused: Not on file    Physically abused: Not on file  Forced sexual activity: Not on file  Other Topics Concern  . 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    H/o etoh and substance abuse    No guns    Wears seat belt, safe in relationship    Current Meds  Medication Sig  . azithromycin (ZITHROMAX Z-PAK) 250 MG tablet Take 2 tablets (500 mg) on  Day 1,  followed by 1 tablet (250 mg) once daily on Days 2 through 5.  . benzonatate (TESSALON PERLES) 100 MG capsule Take 1 capsule (100 mg total) by mouth 3 (three) times daily as needed for cough.  Marland Kitchen. buPROPion (WELLBUTRIN XL) 300 MG 24 hr tablet Take 1 tablet (300 mg total) by mouth daily.  Marland Kitchen. gabapentin (NEURONTIN) 400 MG capsule Take 1 capsule (400 mg total) by mouth 3 (three) times daily.  Marland Kitchen. lamoTRIgine (LAMICTAL) 200 MG tablet Take 1 tablet (200 mg total) by mouth daily.  . naltrexone (DEPADE) 50 MG tablet Take 2 tablets (100 mg total) by mouth daily.  . nicotine polacrilex (NICOTINE MINI) 2 MG lozenge Week 1-6 1 lozenger every 1-2 hours max 5 every 6 hours or  20/day. Week 7 1 lozenge every 2-4 hours max 5 every 6 hours 20/day, week 10-12 1 lozenge every 4-8 hours max 5 every 6 hrs or 20/day  . traZODone (DESYREL) 100 MG tablet Take 1 tablet (100 mg total) by mouth at bedtime as needed for sleep.  . ziprasidone (GEODON) 40 MG capsule Take 1 capsule (40 mg total) by mouth daily.   Allergies  Allergen Reactions  . Chantix [Varenicline Tartrate]     Chest pressure and bugs crawling   . Latex Itching    gloves   Recent Results (from the past 2160 hour(s))  Influenza panel by PCR (type A & B)     Status: None   Collection Time: 08/24/18  5:28 PM  Result Value Ref Range   Influenza A By PCR NEGATIVE NEGATIVE   Influenza B By PCR NEGATIVE NEGATIVE    Comment: (NOTE) The Xpert Xpress Flu assay is intended as an aid in the diagnosis of  influenza and should not be used as a sole basis for treatment.  This  assay is FDA approved for nasopharyngeal swab specimens only. Nasal  washings and aspirates are unacceptable for Xpert Xpress Flu testing. Performed at [facility], [address] [place], [place] [address]    Objective  Body mass index is 25 kg/m. Wt Readings from Last 3 Encounters:  08/29/18 162 lb (73.5 kg)  08/24/18 162 lb (73.5 kg)  05/28/18 168 lb 6.4 oz (76.4 kg)   Temp Readings from Last 3 Encounters:  08/29/18 98.2 F (36.8 C) (Oral)  08/24/18 98.2 F (36.8 C) (Oral)  05/28/18 97.8 F (36.6 C) (Oral)   BP Readings from Last 3 Encounters:  08/29/18 118/78  08/24/18 132/88  05/28/18 132/80   Pulse Readings from Last 3 Encounters:  08/29/18 72  08/24/18 68  05/28/18 82    Physical Exam Vitals signs and nursing note reviewed.  Constitutional:      Appearance: Normal appearance. She is well-developed and overweight.  HENT:     Head: Normocephalic and atraumatic.     Mouth/Throat:     Mouth: Mucous membranes are moist.     Pharynx: Oropharynx is clear.  Eyes:     Conjunctiva/sclera: Conjunctivae  normal.     Pupils: Pupils  are equal, round, and reactive to light.  Cardiovascular:     Rate and Rhythm: Normal rate and regular rhythm.     Heart sounds: Normal heart sounds.  Pulmonary:     Effort: Pulmonary effort is normal.     Breath sounds: Normal breath sounds.  Skin:    General: Skin is warm and dry.  Neurological:     General: No focal deficit present.     Mental Status: She is alert and oriented to person, place, and time. Mental status is at baseline.     Gait: Gait normal.  Psychiatric:        Attention and Perception: Attention and perception normal.        Mood and Affect: Mood and affect normal.        Speech: Speech normal.        Behavior: Behavior normal. Behavior is cooperative.        Thought Content: Thought content normal.        Cognition and Memory: Cognition and memory normal.        Judgment: Judgment normal.     Assessment   1. Bipolar, substance h/o sober x 11 months, anxiety/depression c/w attention issues h/o insomnia 2. Hep C VL neg 07/15/18  3. HLD 4. HM Plan  1. Refer to a new psych clinic  2. Hep A 1/2 vxs hep B immune  GI [facility] appt pending  3. Check lipid this summer given cholesterol info  4.  Had flu shot 2019 Hep A 1/2 vaccines  Tdap had 08/06/14  Hep B immune   Congratulated on cessation of etoh and drugs for now Smoking 1/2 ppdrec cessation Refer dermatology tbse had 2019 normal f/u in 1 year  rec exercise to lose  mammo age 2 breast exam normal today  Pap 05/28/18 neg neg HPV + yeast  Provider: Dr. French Ana McLean-Scocuzza-Internal Medicine

## 2018-08-29 NOTE — Patient Instructions (Signed)

## 2018-09-16 ENCOUNTER — Other Ambulatory Visit: Payer: Self-pay | Admitting: Internal Medicine

## 2018-09-16 ENCOUNTER — Telehealth: Payer: Self-pay | Admitting: Internal Medicine

## 2018-09-16 DIAGNOSIS — F319 Bipolar disorder, unspecified: Secondary | ICD-10-CM

## 2018-09-16 DIAGNOSIS — F1910 Other psychoactive substance abuse, uncomplicated: Secondary | ICD-10-CM

## 2018-09-16 DIAGNOSIS — G4700 Insomnia, unspecified: Secondary | ICD-10-CM

## 2018-09-16 MED ORDER — LAMOTRIGINE 100 MG PO TABS: 100.0000 mg | ORAL_TABLET | Freq: Every day | ORAL | 0 refills | Status: DC

## 2018-09-16 MED ORDER — ZIPRASIDONE HCL 40 MG PO CAPS: 40.0000 mg | ORAL_CAPSULE | Freq: Every day | ORAL | 0 refills | Status: DC

## 2018-09-16 MED ORDER — BUPROPION HCL ER (XL) 300 MG PO TB24: 300.0000 mg | ORAL_TABLET | Freq: Every day | ORAL | 0 refills | Status: DC

## 2018-09-16 MED ORDER — TRAZODONE HCL 100 MG PO TABS: 100.0000 mg | ORAL_TABLET | Freq: Every evening | ORAL | 0 refills | Status: DC | PRN

## 2018-09-16 MED ORDER — GABAPENTIN 400 MG PO CAPS: 400.0000 mg | ORAL_CAPSULE | Freq: Three times a day (TID) | ORAL | 0 refills | Status: AC

## 2018-09-16 MED ORDER — NALTREXONE HCL 50 MG PO TABS: 50.0000 mg | ORAL_TABLET | Freq: Every day | ORAL | 0 refills | Status: DC

## 2018-09-16 NOTE — Telephone Encounter (Signed)
Copied from CRM [id number]. Topic: General - Other >> Sep 16, 2018  9:54 AM Leafy Ro wrote: Reason for CRM:pt is calling and she is still looking for a new psychiatric. The other psychiatric will not refill her medications. Pt did not cancelled the appt within 24 hr. Pt would like to know if dr Judie Grieve could give her 30 day refill on gabapentin, naltrexone, lamotrigine, ziprasidone, bupropion and trazodone. [facility] [place]. Pt saw md on 08-29-2018

## 2018-10-09 ENCOUNTER — Other Ambulatory Visit: Payer: Self-pay

## 2018-10-09 ENCOUNTER — Emergency Department: Payer: BLUE CROSS/BLUE SHIELD

## 2018-10-09 ENCOUNTER — Encounter: Payer: Self-pay | Admitting: Emergency Medicine

## 2018-10-09 ENCOUNTER — Emergency Department
Admission: EM | Admit: 2018-10-09 | Discharge: 2018-10-09 | Disposition: A | Discharge status: Home / Self Care | Payer: BLUE CROSS/BLUE SHIELD | Attending: Emergency Medicine | Admitting: Emergency Medicine

## 2018-10-09 DIAGNOSIS — Z91040 Latex allergy status: Secondary | ICD-10-CM | POA: Insufficient documentation

## 2018-10-09 DIAGNOSIS — F17210 Nicotine dependence, cigarettes, uncomplicated: Secondary | ICD-10-CM | POA: Diagnosis not present

## 2018-10-09 DIAGNOSIS — J40 Bronchitis, not specified as acute or chronic: Secondary | ICD-10-CM | POA: Insufficient documentation

## 2018-10-09 DIAGNOSIS — Z79899 Other long term (current) drug therapy: Secondary | ICD-10-CM | POA: Diagnosis not present

## 2018-10-09 DIAGNOSIS — R05 Cough: Secondary | ICD-10-CM | POA: Diagnosis not present

## 2018-10-09 MED ORDER — SULFAMETHOXAZOLE-TRIMETHOPRIM 800-160 MG PO TABS: 1.0000 | ORAL_TABLET | Freq: Two times a day (BID) | ORAL | 0 refills | Status: DC

## 2018-10-09 MED ORDER — ALBUTEROL SULFATE HFA 108 (90 BASE) MCG/ACT IN AERS: 2.0000 | INHALATION_SPRAY | Freq: Four times a day (QID) | RESPIRATORY_TRACT | 2 refills | Status: DC | PRN

## 2018-10-09 MED ORDER — PREDNISONE 10 MG PO TABS: ORAL_TABLET | ORAL | 0 refills | Status: DC

## 2018-10-09 NOTE — Discharge Instructions (Signed)
Follow-up with your primary care provider if any continued problems.  Begin taking medication only as directed.  Begin using your albuterol inhaler as needed for wheezing.  Prednisone as directed starting with 6 tablets and tapering down over the next 6 days.  Bactrim DS twice a day for the next 10 days.  Decrease or discontinue smoking.  Increase fluids.  Also have your primary care provider recheck your blood pressure as it was elevated in the emergency department.  Your initial blood pressure was 124/100.  This could be because of the over-the-counter cough medication that you have been taking.

## 2018-10-09 NOTE — ED Provider Notes (Signed)
[facility] Emergency Department Provider Note   ____________________________________________   First MD Initiated Contact with Patient 10/09/18 1029     (approximate)  I have reviewed the triage vital signs and the nursing notes.   HISTORY  Chief Complaint Cough; Generalized Body Aches; and Sore Throat   HPI [name] is a 38 y.o. female presents to the ED with complaint of body aches, sore throat and a cough for the last 3 days.  Patient states that she has been sleeping propped up as when she is lying down she feels that she is wheezing.  Patient in the past has used an inhaler which she does not have currently.  Patient smokes 1 pack cigarettes a day for the last 10 years.  She has had problems with bronchitis in the past but denies any history of pneumonia.  She is unaware of any fever.  Been taking over-the-counter cough preparations without much relief.  She denies any nausea, vomiting or diarrhea.  She rates her pain as a 4 out of 10.     Past Medical History:  Diagnosis Date  . Anxiety   . Bipolar 1 disorder (HCC)   . Depression   . Hyperlipidemia   . Substance abuse (HCC)    etoh, heroin, cocaine, opiates  . Tendonitis    L arm   . UTI (urinary tract infection)     Patient Active Problem List   Diagnosis Date Noted  . Attention deficit disorder 08/29/2018  . Left carpal tunnel syndrome 05/28/2018  . Numbness and tingling in left arm 05/28/2018  . Hyperlipidemia 05/28/2018  . Cervical radiculopathy at C6 05/13/2018  . Hepatitis C 03/04/2018  . Substance abuse (HCC) 02/14/2018  . Insomnia 02/14/2018  . Bipolar disorder (HCC) 02/14/2018  . Myositis 02/16/2014  . Anxiety and depression 02/16/2014    Past Surgical History:  Procedure Laterality Date  . LEEP     2001  . TUBAL LIGATION      Prior to Admission medications   Medication Sig Start Date End Date Taking? Authorizing Provider  albuterol (PROVENTIL HFA;VENTOLIN  HFA) 108 (90 Base) MCG/ACT inhaler Inhale 2 puffs into the lungs every 6 (six) hours as needed for wheezing or shortness of breath. 10/09/18   Tommi Rumps, PA-C  benzonatate (TESSALON PERLES) 100 MG capsule Take 1 capsule (100 mg total) by mouth 3 (three) times daily as needed for cough. 08/24/18 08/24/19  Darci Current, MD  buPROPion (WELLBUTRIN XL) 300 MG 24 hr tablet Take 1 tablet (300 mg total) by mouth daily. 09/16/18   McLean-Scocuzza, Pasty Spillers, MD  gabapentin (NEURONTIN) 400 MG capsule Take 1 capsule (400 mg total) by mouth 3 (three) times daily. 09/16/18   McLean-Scocuzza, Pasty Spillers, MD  lamoTRIgine (LAMICTAL) 100 MG tablet Take 1 tablet (100 mg total) by mouth daily. 09/16/18   McLean-Scocuzza, Pasty Spillers, MD  meloxicam (MOBIC) 15 MG tablet Take 1 tablet (15 mg total) by mouth daily as needed for pain. Patient not taking: Reported on 08/29/2018 05/28/18   McLean-Scocuzza, Pasty Spillers, MD  naltrexone (DEPADE) 50 MG tablet Take 1 tablet (50 mg total) by mouth daily. 09/16/18   McLean-Scocuzza, Pasty Spillers, MD  nicotine polacrilex (NICOTINE MINI) 2 MG lozenge Week 1-6 1 lozenger every 1-2 hours max 5 every 6 hours or 20/day. Week 7 1 lozenge every 2-4 hours max 5 every 6 hours 20/day, week 10-12 1 lozenge every 4-8 hours max 5 every 6 hrs or 20/day 06/17/18  McLean-Scocuzza, Pasty Spillers, MD  predniSONE (DELTASONE) 10 MG tablet Take 6 tablets  today, on day 2 take 5 tablets, day 3 take 4 tablets, day 4 take 3 tablets, day 5 take  2 tablets and 1 tablet the last day 10/09/18   Tommi Rumps, PA-C  sulfamethoxazole-trimethoprim (BACTRIM DS,SEPTRA DS) 800-160 MG tablet Take 1 tablet by mouth 2 (two) times daily. 10/09/18   Tommi Rumps, PA-C  traZODone (DESYREL) 100 MG tablet Take 1 tablet (100 mg total) by mouth at bedtime as needed for sleep. 09/16/18   McLean-Scocuzza, Pasty Spillers, MD  ziprasidone (GEODON) 40 MG capsule Take 1 capsule (40 mg total) by mouth daily. 09/16/18   McLean-Scocuzza, Pasty Spillers, MD     Allergies Chantix [varenicline tartrate] and Latex  Family History  Problem Relation Age of Onset  . Cancer Mother        skin   . Alcohol abuse Father   . Depression Father   . Thyroid disease Father   . Stroke Father   . Cancer Maternal Grandmother        breast  . Cancer Paternal Grandmother        breast     Social History Social History   Tobacco Use  . Smoking status: Current Every Day Smoker    Packs/day: 1.00    Types: Cigarettes  . Smokeless tobacco: Never Used  Substance Use Topics  . Alcohol use: Yes    Comment: twisted tea and a few beers everyday  . Drug use: Yes    Types: Cocaine, Marijuana    Comment: today    Review of Systems Constitutional: No fever/chills Eyes: No visual changes. ENT: Positive sore throat. Cardiovascular: Denies chest pain. Respiratory: Positive shortness of breath.  Positive cough. Gastrointestinal: No abdominal pain.  No nausea, no vomiting.   Genitourinary: Negative for dysuria. Musculoskeletal: Positive body aches. Skin: Negative for rash. Neurological: Negative for headaches, focal weakness or numbness. ____________________________________________   PHYSICAL EXAM:  VITAL SIGNS: ED Triage Vitals  Enc Vitals Group     BP 10/09/18 0944 (!) 124/100     Pulse Rate 10/09/18 0944 82     Resp 10/09/18 0944 16     Temp 10/09/18 0944 98.1 F (36.7 C)     Temp Source 10/09/18 0944 Oral     SpO2 10/09/18 0944 97 %     Weight 10/09/18 0945 162 lb (73.5 kg)     Height 10/09/18 0945  (1.702 m)     Head Circumference --      Peak Flow --      Pain Score 10/09/18 0945 4     Pain Loc --      Pain Edu? --      Excl. in GC? --     Constitutional: Alert and oriented. Well appearing and in no acute distress. Eyes: Conjunctivae are normal.  Head: Atraumatic. Nose: Mild congestion/rhinnorhea.  TMs are dull dull bilaterally. Mouth/Throat: Mucous membranes are moist.  Oropharynx non-erythematous.  No exudate.  Uvula is  midline. Neck: No stridor.   Hematological/Lymphatic/Immunilogical: No cervical lymphadenopathy. Cardiovascular: Normal rate, regular rhythm. Grossly normal heart sounds.  Good peripheral circulation. Respiratory: Normal respiratory effort.  No retractions. Lungs patient has congested cough but no wheezing is noted at this time.  Patient is able to speak in complete sentences without any difficulties. Gastrointestinal: Soft and nontender. No distention. Musculoskeletal: Moves upper and lower extremities without any difficulty.  Normal gait was noted. Neurologic:  Normal  speech and language. No gross focal neurologic deficits are appreciated.  Skin:  Skin is warm, dry and intact. No rash noted. Psychiatric: Mood and affect are normal. Speech and behavior are normal.  ____________________________________________   LABS (all labs ordered are listed, but only abnormal results are displayed)  Labs Reviewed - No data to display  RADIOLOGY  Official radiology report(s): Dg Chest 2 View  Result Date: 10/09/2018 CLINICAL DATA:  Cough, wheezing for 4 days EXAM: CHEST - 2 VIEW COMPARISON:  08/24/2018 FINDINGS: There is bilateral diffuse interstitial thickening and peribronchial cuffing most concerning for bronchitis. There is no pleural effusion or pneumothorax. The heart and mediastinal contours are unremarkable. The osseous structures are unremarkable. IMPRESSION: Bilateral diffuse interstitial thickening and peribronchial cuffing most concerning for bronchitis. Electronically Signed   By: Elige Ko   On: 10/09/2018 12:19    ____________________________________________   PROCEDURES  Procedure(s) performed (including Critical Care):  Procedures   ____________________________________________   INITIAL IMPRESSION / ASSESSMENT AND PLAN / ED COURSE  As part of my medical decision making, I reviewed the following data within the electronic MEDICAL RECORD NUMBER Notes from prior ED visits and  [place] Controlled Substance Database     Patient presents to the ED with complaint of body aches, sore throat and cough for the last 3 days.  Patient is a cigarette smoker smoking 1 pack cigarettes per day for the last 10 years.  She states that she has been wheezing especially at night.  She has a productive cough at times.  She has been taking over-the-counter medication without any relief.  She states that in the past she has had to use an inhaler but does not have one now.  She does have a history of bronchitis.  Chest x-ray is positive for bronchitis.  Patient was made aware.  She was given a prescription for an albuterol inhaler, prednisone and Bactrim DS twice daily for 10 days.  She is to follow-up with her PCP.  Initial blood pressure was elevated however prior to discharge her blood pressure was 133/72.   ____________________________________________   FINAL CLINICAL IMPRESSION(S) / ED DIAGNOSES  Final diagnoses:  Bronchitis  Cigarette smoker     ED Discharge Orders         Ordered    predniSONE (DELTASONE) 10 MG tablet     10/09/18 1232    albuterol (PROVENTIL HFA;VENTOLIN HFA) 108 (90 Base) MCG/ACT inhaler  Every 6 hours PRN     10/09/18 1232    sulfamethoxazole-trimethoprim (BACTRIM DS,SEPTRA DS) 800-160 MG tablet  2 times daily     10/09/18 1232           Note:  This document was prepared using Dragon voice recognition software and may include unintentional dictation errors.    Tommi Rumps, PA-C 10/09/18 1424    Arnaldo Natal, MD 10/09/18 [phone]

## 2018-10-09 NOTE — ED Triage Notes (Signed)
Body aches, sore throat, cough for 3 days.

## 2018-10-29 ENCOUNTER — Other Ambulatory Visit: Payer: Self-pay | Admitting: Internal Medicine

## 2018-10-29 DIAGNOSIS — F319 Bipolar disorder, unspecified: Secondary | ICD-10-CM

## 2018-10-29 DIAGNOSIS — G4700 Insomnia, unspecified: Secondary | ICD-10-CM

## 2018-10-29 DIAGNOSIS — F1910 Other psychoactive substance abuse, uncomplicated: Secondary | ICD-10-CM

## 2018-10-29 MED ORDER — BUPROPION HCL ER (XL) 300 MG PO TB24: 300.0000 mg | ORAL_TABLET | Freq: Every day | ORAL | 0 refills | Status: AC

## 2018-10-29 MED ORDER — LAMOTRIGINE 100 MG PO TABS: 100.0000 mg | ORAL_TABLET | Freq: Every day | ORAL | 0 refills | Status: AC

## 2018-10-29 MED ORDER — TRAZODONE HCL 100 MG PO TABS: 100.0000 mg | ORAL_TABLET | Freq: Every evening | ORAL | 0 refills | Status: AC | PRN

## 2018-10-29 NOTE — Telephone Encounter (Signed)
All meds need to come from psychiatry pt was informed this when refills were sent for 1x only last time  Inform pt   TMS

## 2018-11-07 ENCOUNTER — Encounter: Payer: Self-pay | Admitting: Internal Medicine

## 2018-12-08 ENCOUNTER — Telehealth: Payer: BC Managed Care – PPO | Admitting: Physician Assistant

## 2018-12-08 ENCOUNTER — Other Ambulatory Visit: Payer: Self-pay | Admitting: Internal Medicine

## 2018-12-08 ENCOUNTER — Telehealth: Payer: Self-pay | Admitting: Internal Medicine

## 2018-12-08 ENCOUNTER — Ambulatory Visit: Payer: Self-pay | Admitting: Physician Assistant

## 2018-12-08 ENCOUNTER — Encounter: Payer: Self-pay | Admitting: Physician Assistant

## 2018-12-08 VITALS — BP 100/80 | HR 87 | Temp 98.3°F | Resp 16 | Wt 163.0 lb

## 2018-12-08 DIAGNOSIS — M5442 Lumbago with sciatica, left side: Secondary | ICD-10-CM

## 2018-12-08 DIAGNOSIS — R531 Weakness: Secondary | ICD-10-CM

## 2018-12-08 DIAGNOSIS — M545 Low back pain, unspecified: Secondary | ICD-10-CM

## 2018-12-08 DIAGNOSIS — M549 Dorsalgia, unspecified: Secondary | ICD-10-CM

## 2018-12-08 DIAGNOSIS — M5441 Lumbago with sciatica, right side: Secondary | ICD-10-CM

## 2018-12-08 DIAGNOSIS — N3000 Acute cystitis without hematuria: Secondary | ICD-10-CM

## 2018-12-08 LAB — POCT URINALYSIS DIPSTICK
Bilirubin, UA: NEGATIVE
Blood, UA: NEGATIVE
Glucose, UA: NEGATIVE
Ketones, UA: NEGATIVE
Leukocytes, UA: NEGATIVE
Nitrite, UA: NEGATIVE
Protein, UA: NEGATIVE
Spec Grav, UA: <=1.005 — AB (ref 1.010–1.025)
Urobilinogen, UA: 0.2 E.U./dL
pH, UA: 5.5 (ref 5.0–8.0)

## 2018-12-08 LAB — POCT URINE PREGNANCY: Preg Test, Ur: NEGATIVE

## 2018-12-08 MED ORDER — PREDNISONE 50 MG PO TABS: 50.0000 mg | ORAL_TABLET | Freq: Every day | ORAL | 0 refills | Status: AC

## 2018-12-08 NOTE — Progress Notes (Signed)
Patient ID: [name] DOB: [birth year] AGE: 38 y.o. MRN: [id number]   PCP: McLean-Scocuzza, Nino Glow, MD   Chief Complaint:  Chief Complaint  Patient presents with  . Back Pain    x3-4d     Subjective:    HPI:  [name] is a 38 y.o. female presents for evaluation  Chief Complaint  Patient presents with  . Back Pain    x34-66d   38 year old female presents to [facility] with four day history of low back pain. Woke up Thursday 12/04/2018 with mild low back discomfort. Soreness/stiffness. Suspected was due to being premenstrual (has had similar back pain due to impending menstruation). No known initial injury/trauma. Equal bilaterally. Following day helped friend sand and stain deck. On hands and knees, with bent back, for majority of next several days. Back pain worsened. Constant. Will have episodes of worsening pain; typically with movement and with palpation. Pain severe in the morning; 6/10. Minimal radiation of pain, to lateral aspect of buttocks. Patient has applied heating pad/used heated seats in car, has been taking OTC Aleve, and has used mother's TENs unit with moderate improvement, though temporary.   Patient with previous history of kidney stone. Presented when pregnant. Resolved with medication. No history of pyelonephritis. Denies increased urinary frequency, urinary urgency, nocturia, dysuria, sensation of incomplete emptying. Patient currently menstruating.  Patient with known cervical degenerative disc disease and previous history of cervical radiculopathy. Treated by PCP, Dr. Eliezer Lofts MD with [facility], in October 2019. Resolved with prednisone taper and Lidocaine patch.  Patient denies fever, chills, sweats, diffuse body aches, nausea/vomiting, flank pain, saddle anesthesia, lower extremity weakness, ambulation difficulty. Patient is a current cigarette smoker; 1 ppd x 10 years. Patient with history of substance  abuse, states struggling with sobriety, used alcohol and marijuana two months ago per ED note (issue with ETOH, heroin, cocaine, and opiates). Patient on Naltrexone, as well as Trazodone to help with sleep. Patient with chronic HCV infection. Doing well; completed 8 week course of Mavyret. States her HepC is no longer detectable on blood work and her liver enzymes have normalized.   Patient completed E-Visit this morning. Mentioned weakness in her questionaire (patient now says her weakness is fear of the pain moving will cause). Patient was advised to complete a face to face visit.  A limited review of symptoms was performed, pertinent positives and negatives as mentioned in HPI.  The following portions of the patient's history were reviewed and updated as appropriate: allergies, current medications and past medical history.  Patient Active Problem List   Diagnosis Date Noted  . Attention deficit disorder 08/29/2018  . Left carpal tunnel syndrome 05/28/2018  . Numbness and tingling in left arm 05/28/2018  . Hyperlipidemia 05/28/2018  . Cervical radiculopathy at C6 05/13/2018  . Hepatitis C 03/04/2018  . Substance abuse ([place]) 02/14/2018  . Insomnia 02/14/2018  . Bipolar disorder ([place]) 02/14/2018  . Myositis 02/16/2014  . Anxiety and depression 02/16/2014    Allergies  Allergen Reactions  . Chantix [Varenicline Tartrate]     Chest pressure and bugs crawling   . Latex Itching    gloves    Current Outpatient Medications on File Prior to Visit  Medication Sig Dispense Refill  . albuterol (PROVENTIL HFA;VENTOLIN HFA) 108 (90 Base) MCG/ACT inhaler Inhale 2 puffs into the lungs every 6 (six) hours as needed for wheezing or shortness of breath. 1 Inhaler 2  . buPROPion (WELLBUTRIN XL) 300 MG  24 hr tablet Take 1 tablet (300 mg total) by mouth daily. 30 tablet 0  . gabapentin (NEURONTIN) 400 MG capsule Take 1 capsule (400 mg total) by mouth 3 (three) times daily. 90 capsule 0  . lamoTRIgine  (LAMICTAL) 100 MG tablet Take 1 tablet (100 mg total) by mouth daily. 30 tablet 0  . naltrexone (DEPADE) 50 MG tablet Take 1 tablet (50 mg total) by mouth daily. 30 tablet 0  . traZODone (DESYREL) 100 MG tablet Take 1 tablet (100 mg total) by mouth at bedtime as needed for sleep. 30 tablet 0  . ziprasidone (GEODON) 40 MG capsule Take 1 capsule (40 mg total) by mouth daily. 30 capsule 0  . sulfamethoxazole-trimethoprim (BACTRIM DS,SEPTRA DS) 800-160 MG tablet Take 1 tablet by mouth 2 (two) times daily. 20 tablet 0   No current facility-administered medications on file prior to visit.        Objective:   Vitals:   12/08/18 1333  BP: 100/80  Pulse: 87  Resp: 16  Temp: 98.3 F (36.8 C)  SpO2: 97%     Wt Readings from Last 3 Encounters:  12/08/18 163 lb (73.9 kg)  10/09/18 162 lb (73.5 kg)  08/29/18 162 lb (73.5 kg)    Physical Exam:   General Appearance:  Patient sitting comfortably on examination table. Conversational. Kermit Balo self-historian. In no acute distress. Afebrile.   Head:  Normocephalic, without obvious abnormality, atraumatic  Eyes:  PERRL, conjunctiva/corneas clear, EOM's intact  Throat: Lips, mucosa, and tongue normal; teeth and gums normal. Throat reveals no erythema. No postnasal drip. No visible cobblestoning. Tonsils with no enlargement or exudate. Uvula midline with no edema or erythema.  Cervical spine normal to inspection. No midline tenderness. No palpable stepoff, deformity, or crepitus. Full ROM.  Neck: Supple, symmetrical, trachea midline, no adenopathy  Lungs:   Clear to auscultation bilaterally, respirations unlabored. Good aeration. No rales, rhonchi, crackles or wheezing.  Heart:  Regular rate and rhythm, S1 and S2 normal, no murmur, rub, or gallop  Abdomen:   Normal to inspection. Normoactive bowel sounds. No tenderness with palpation. No guarding, rigidity or rebound tenderness. No palpable organomegaly. No CVA tenderness with percussion bilaterally.   Extremities: Mild midline lumber spine tenderness; pain worse over paraspinal musculature, equal right and left. Palpable muscle tightness/spasm and guarding. No palpable midline stepoff, deformity, or crepitus. No SI joint tenderness. No overlying rash. Full ROM of low back; pain with forward flexion, lateral flexion, and rotation. Patient more comfortable with sitting slightly hunched forward; pain with sitting upright. 5/5 lower extremity muscle strength. Strait leg raise test elicits pain in low back and tightness/pulling sensation at lateral aspect of buttocks bilaterally. No gait abnormality. Patient easily gets on and off exam table. DTRs WNL bilaterally. Sensation intact. Brisk capillary refill.  Pulses: 2+ and symmetric  Skin: Skin color, texture, turgor normal, no rashes or lesions  Lymph nodes: Cervical, supraclavicular, and axillary nodes normal  Neurologic: Normal    Assessment & Plan:    Exam findings, diagnosis etiology and medication use and indications reviewed with patient. Follow-Up and discharge instructions provided. No emergent/urgent issues found on exam.  Patient education was provided.   Patient verbalized understanding of information provided and agrees with plan of care (POC), all questions answered. The patient is advised to call or return to clinic if condition does not see an improvement in symptoms, or to seek the care of the closest emergency department if condition worsens with the below plan.  1. Acute bilateral low back pain with bilateral sciatica - POCT Urinalysis Dipstick - POCT urine pregnancy - predniSONE (DELTASONE) 50 MG tablet; Take 1 tablet (50 mg total) by mouth daily with breakfast for 5 days.  Dispense: 5 tablet; Refill: 0  Patient with four day history of bilateral low back pain. Exacerbated with movement and palpation. Strait leg raise bilaterally elicits radiculopathy symptoms. VSS, afebrile, in no acute distress, no red flag symptoms:  fever/chills, saddle anesthesia, bowel/bladder incontinence, lower extremity weakness. Suspect patient with lumbar sprain/strain with associated muscle spasm/tightness and lumbar radiculopathy. History of cervical degenerative disc disease with cervical radiculopathy; resolved with prednisone course.  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 Sober for short period of time. Risk of osteomyelitis in lumbar spine. Patient also with history of nephrolithiasis/ureteral stone. Possibility of another stone. Differential also includes referred pain from menstruation, multiple myeloma (given pain began prior to new activity/staining deck), compression fracture, spondylolisthesis, etc. UA performed in office today negative for HCG and negative for infection (no blood, leukocytes, or nitrites).  At this time, believe patient can be treated conservatively. Prescribed 5-day course of prednisone 36m qd. Patient declined muscle relaxer due to substance abuse history. Will continue to use TENs unit, will apply topical analgesic, and will perform stretching exercises. Advised patient go to the ED immediately with fever, worsening back pain, incontinence, leg weakness, or other new/conerning symptom. Advised patient follow-up with family physician for continued management of back pain; including imaging, physical therapy, and/or referral to orthopedist. Patient agreed with plan.   SDarlin Priestly MHS, PA-C SMontey Hora MHS, PA-C Advanced Practice Provider [facility] [address] [id number]): [phone] [email or web address]

## 2018-12-08 NOTE — Telephone Encounter (Signed)
If pt thinks UTI causing back pain set up Virtual visit this week and have her drop off a urine wearing a mask covering nose and mouth   Still ask 1st telephone note ?s   TMS

## 2018-12-08 NOTE — Telephone Encounter (Signed)
Please make sure pt has seen psychiatry which I referred her?  She can do a virtual visit if her back pain is resolved schedule for the next 1-2 weeks out from today  If needed she can have Xray In the future   Look up back stretches [facility] or web MD website As needed Tylenol and heating pad   TMS

## 2018-12-08 NOTE — Patient Instructions (Signed)
Thank you for choosing [facility] for your health care needs.  You have been diagnosed with low back pain.  Suspect combination of musculoskeletal injury (sprained back), lumbar degenerative disc disease, and lumbar radiculopathy.  Recommend rest. Ice/heat. May use over the counter [place], IcyHot or BioFreeze. Continue to use at-home TENs unit.  Perform stretching exercises.  Take steroid pack as prescribed. After completing steroid (prednisone) may use ibuprofen for pain/discomfort.  Do NOT take Meloxicam while on prednisone.  Follow-up immediately at urgent care or ED if you develop worsening back pain, leg weakness, urinary incontinence, or other new/concerning symptom.  May wish to follow-up with family physician to discuss further management of back pain; such as physical therapy.  Hope you feel better soon!  Acute Back Pain, Adult Acute back pain is sudden and usually short-lived. It is often caused by an injury to the muscles and tissues in the back. The injury may result from:  A muscle or ligament getting overstretched or torn (strained). Ligaments are tissues that connect bones to each other. Lifting something improperly can cause a back strain.  Wear and tear (degeneration) of the spinal disks. Spinal disks are circular tissue that provides cushioning between the bones of the spine (vertebrae).  Twisting motions, such as while playing sports or doing yard work.  A hit to the back.  Arthritis. You may have a physical exam, lab tests, and imaging tests to find the cause of your pain. Acute back pain usually goes away with rest and home care. Follow these instructions at home: Managing pain, stiffness, and swelling  Take over-the-counter and prescription medicines only as told by your health care provider.  Your health care provider may recommend applying ice during the first 24-48 hours after your pain starts. To do this: ? Put ice in a plastic bag. ? Place a towel  between your skin and the bag. ? Leave the ice on for 20 minutes, 2-3 times a day.  If directed, apply heat to the affected area as often as told by your health care provider. Use the heat source that your health care provider recommends, such as a moist heat pack or a heating pad. ? Place a towel between your skin and the heat source. ? Leave the heat on for 20-30 minutes. ? Remove the heat if your skin turns bright red. This is especially important if you are unable to feel pain, heat, or cold. You have a greater risk of getting burned. Activity   Do not stay in bed. Staying in bed for more than 1-2 days can delay your recovery.  Sit up and stand up straight. Avoid leaning forward when you sit, or hunching over when you stand. ? If you work at a desk, sit close to it so you do not need to lean over. Keep your chin tucked in. Keep your neck drawn back, and keep your elbows bent at a right angle. Your arms should look like the letter "L." ? Sit high and close to the steering wheel when you drive. Add lower back (lumbar) support to your car seat, if needed.  Take short walks on even surfaces as soon as you are able. Try to increase the length of time you walk each day.  Do not sit, drive, or stand in one place for more than 30 minutes at a time. Sitting or standing for long periods of time can put stress on your back.  Do not drive or use heavy machinery while taking prescription pain  medicine.  Use proper lifting techniques. When you bend and lift, use positions that put less stress on your back: ? [place] Mountain[place]end your knees. ? Keep the load close to your body. ? Avoid twisting.  Exercise regularly as told by your health care provider. Exercising helps your back heal faster and helps prevent back injuries by keeping muscles strong and flexible.  Work with a physical therapist to make a safe exercise program, as recommended by your health care provider. Do any exercises as told by your physical  therapist. Lifestyle  Maintain a healthy weight. Extra weight puts stress on your back and makes it difficult to have good posture.  Avoid activities or situations that make you feel anxious or stressed. Stress and anxiety increase muscle tension and can make back pain worse. Learn ways to manage anxiety and stress, such as through exercise. General instructions  Sleep on a firm mattress in a comfortable position. Try lying on your side with your knees slightly bent. If you lie on your back, put a pillow under your knees.  Follow your treatment plan as told by your health care provider. This may include: ? Cognitive or behavioral therapy. ? Acupuncture or massage therapy. ? Meditation or yoga. Contact a health care provider if:  You have pain that is not relieved with rest or medicine.  You have increasing pain going down into your legs or buttocks.  Your pain does not improve after 2 weeks.  You have pain at night.  You lose weight without trying.  You have a fever or chills. Get help right away if:  You develop new bowel or bladder control problems.  You have unusual weakness or numbness in your arms or legs.  You develop nausea or vomiting.  You develop abdominal pain.  You feel faint. Summary  Acute back pain is sudden and usually short-lived.  Use proper lifting techniques. When you bend and lift, use positions that put less stress on your back.  Take over-the-counter and prescription medicines and apply heat or ice as directed by your health care provider. This information is not intended to replace advice given to you by your health care provider. Make sure you discuss any questions you have with your health care provider. Document Released: 07/23/2005 Document Revised: 02/27/2018 Document Reviewed: 03/06/2017 Elsevier Interactive Patient Education  2019 [employer]

## 2018-12-08 NOTE — Progress Notes (Signed)
Based on what you shared with me, I feel your condition warrants further evaluation and I recommend that you be seen for a face to face office visit.  Ms. [name], Back pain with weakness, especially as you mentioned that it is markedly different than back pain in the past, warrants face to face evaluation.     NOTE: If you entered your credit card information for this eVisit, you will not be charged. You may see a "hold" on your card for the $35 but that hold will drop off and you will not have a charge processed.  If you are having a true medical emergency please call 911.  If you need an urgent face to face visit, [place] has four urgent care centers for your convenience.    PLEASE NOTE: THE [facility] LOCATIONS AND URGENT CARE CLINICS DO NOT HAVE THE TESTING FOR CORONAVIRUS COVID19 AVAILABLE.  IF YOU FEEL YOU NEED THIS TEST YOU MUST GO TO A TRIAGE LOCATION AT ONE OF THE HOSPITAL EMERGENCY DEPARTMENTS   [email or web address] to reserve your spot online an avoid wait times  [facility] [address] [place], [place] [address] Modified hours of operation: Monday-Friday, 12 PM to 6 PM  Saturday & Sunday 10 AM to 4 PM *Across the street from [employer]  [employer] (New Address!) [address] [place], [place] [address] *Just off [employer], across the road from [place]* Modified hours of operation: Monday-Friday, 12 PM to 6 PM  Closed Saturday & Sunday  [facility]'s modified hours of operation will be in effect from May 1 until May 31   The following sites will take your insurance:  . [facility]  [phone] Get Driving Directions Find a Provider at this Location  [address] [place], [place] [address] . 10 am to 8 pm Monday-Friday . 12 pm to 8 pm Saturday-Sunday   . [facility] at [facility]  [phone] Get Driving Directions Find a Provider at  this Location  [address] [place], [place] [address] . 8 am to 8 pm Monday-Friday . 9 am to 6 pm Saturday . 11 am to 6 pm Sunday   . [facility] at [facility]  [phone] Get Driving Directions  [address] [place], [place] [address] . 8 am to 8 pm Monday-Friday . 8 am to 4 pm Saturday-Sunday   Your e-visit answers were reviewed by a board certified advanced clinical practitioner to complete your personal care plan.  Thank you for using e-Visits. I have spent 7 min in completion and review of this note- [facility]

## 2018-12-08 NOTE — Telephone Encounter (Signed)
Patient has been informed.

## 2018-12-08 NOTE — Telephone Encounter (Signed)
Ok well if back still hurting schedule virtual in 1-2 weeks   TMS

## 2018-12-08 NOTE — Telephone Encounter (Signed)
[facility] on [facility]TI and Back pain

## 2018-12-10 ENCOUNTER — Telehealth: Payer: Self-pay | Admitting: Emergency Medicine

## 2018-12-10 NOTE — Telephone Encounter (Signed)
Spoke with patient whom informed me that she is doing better what was prescribed to her has made it tolerable . This was a follow up call from visit with [facility]

## 2018-12-14 ENCOUNTER — Encounter: Payer: Self-pay | Admitting: Internal Medicine

## 2018-12-15 ENCOUNTER — Other Ambulatory Visit: Payer: Self-pay | Admitting: Internal Medicine

## 2018-12-15 DIAGNOSIS — M5441 Lumbago with sciatica, right side: Secondary | ICD-10-CM

## 2018-12-15 DIAGNOSIS — M5442 Lumbago with sciatica, left side: Secondary | ICD-10-CM

## 2018-12-15 DIAGNOSIS — M549 Dorsalgia, unspecified: Secondary | ICD-10-CM

## 2018-12-16 ENCOUNTER — Ambulatory Visit (INDEPENDENT_AMBULATORY_CARE_PROVIDER_SITE_OTHER): Payer: BLUE CROSS/BLUE SHIELD | Admitting: Internal Medicine

## 2018-12-16 ENCOUNTER — Other Ambulatory Visit: Payer: Self-pay

## 2018-12-16 ENCOUNTER — Ambulatory Visit
Admission: RE | Admit: 2018-12-16 | Discharge: 2018-12-16 | Disposition: A | Discharge status: Home / Self Care | Payer: BLUE CROSS/BLUE SHIELD | Source: Ambulatory Visit | Attending: Internal Medicine | Admitting: Internal Medicine

## 2018-12-16 DIAGNOSIS — M5441 Lumbago with sciatica, right side: Secondary | ICD-10-CM

## 2018-12-16 DIAGNOSIS — M549 Dorsalgia, unspecified: Secondary | ICD-10-CM

## 2018-12-16 DIAGNOSIS — B182 Chronic viral hepatitis C: Secondary | ICD-10-CM | POA: Diagnosis not present

## 2018-12-16 DIAGNOSIS — M5416 Radiculopathy, lumbar region: Secondary | ICD-10-CM

## 2018-12-16 DIAGNOSIS — Z0000 Encounter for general adult medical examination without abnormal findings: Secondary | ICD-10-CM

## 2018-12-16 DIAGNOSIS — E785 Hyperlipidemia, unspecified: Secondary | ICD-10-CM

## 2018-12-16 NOTE — Progress Notes (Signed)
telephone Note failed video  I connected with [name]  on 12/16/18 at  3:41 PM EDT by telephone and verified that I am speaking with the correct person using two identifiers.  Location patient: home Location provider:work Persons participating in the virtual visit: patient, provider  I discussed the limitations of evaluation and management by telemedicine and the availability of in person appointments. The patient expressed understanding and agreed to proceed.   HPI: Saw E visit 12/08/2018 c/o low back pain radiating to mid back and b/l legs R>left and hip pain, buttocks 7-8/10 tried 2 Goodys in the am which helped. Back is catching declines muscle relaxer due to history of substance abuse. Tried hot/cold, biofreeze, Tens machine stretching. Previously had C Xray +arthritis/curvature and spasm 05/2018  Back pain started late 11/2018 early 12/2018 after she was staining a deck. Back pain is worse in the am. Pain was crippling hard to sit, lie walk. Steroids given 12/08/2018 helped some and she finished them 12/12/2018 but now back pain is persisting.    ROS: See pertinent positives and negatives per HPI.  Past Medical History:  Diagnosis Date  . Anxiety   . Bipolar 1 disorder (HCC)   . Depression   . Hyperlipidemia   . Substance abuse (HCC)    etoh, heroin, cocaine, opiates  . Tendonitis    L arm   . UTI (urinary tract infection)     Past Surgical History:  Procedure Laterality Date  . LEEP     2001  . TUBAL LIGATION      Family History  Problem Relation Age of Onset  . Cancer Mother        skin   . Alcohol abuse Father   . Depression Father   . Thyroid disease Father   . Stroke Father   . Cancer Maternal Grandmother        breast  . 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  H/o etoh and substance abuse clean x 11 months as of 08/28/2018 No guns  Wears seat belt, safe in relationship    Current  Outpatient Medications:  .  albuterol (PROVENTIL HFA;VENTOLIN HFA) 108 (90 Base) MCG/ACT inhaler, Inhale 2 puffs into the lungs every 6 (six) hours as needed for wheezing or shortness of breath., Disp: 1 Inhaler, Rfl: 2 .  buPROPion (WELLBUTRIN XL) 300 MG 24 hr tablet, Take 1 tablet (300 mg total) by mouth daily., Disp: 30 tablet, Rfl: 0 .  gabapentin (NEURONTIN) 400 MG capsule, Take 1 capsule (400 mg total) by mouth 3 (three) times daily., Disp: 90 capsule, Rfl: 0 .  lamoTRIgine (LAMICTAL) 100 MG tablet, Take 1 tablet (100 mg total) by mouth daily., Disp: 30 tablet, Rfl: 0 .  naltrexone (DEPADE) 50 MG tablet, Take 1 tablet (50 mg total) by mouth daily., Disp: 30 tablet, Rfl: 0 .  sulfamethoxazole-trimethoprim (BACTRIM DS,SEPTRA DS) 800-160 MG tablet, Take 1 tablet by mouth 2 (two) times daily., Disp: 20 tablet, Rfl: 0 .  traZODone (DESYREL) 100 MG tablet, Take 1 tablet (100 mg total) by mouth at bedtime as needed for sleep., Disp: 30 tablet, Rfl: 0 .  ziprasidone (GEODON) 40 MG capsule, Take 1 capsule (40 mg total) by mouth daily., Disp: 30 capsule, Rfl: 0  EXAM:  VITALS per patient if applicable:  GENERAL: alert, oriented, appears well and in no acute distress  MS: moves  all visible extremities without noticeable abnormality  PSYCH/NEURO: pleasant and cooperative, no obvious depression or anxiety, speech and thought processing grossly intact  ASSESSMENT AND PLAN:  Discussed the following assessment and plan:  Mid back pain - Plan: DG Thoracic Spine 2 View, DG Lumbar Spine Complete  Acute bilateral low back pain with right-sided sciatica - Plan: DG Thoracic Spine 2 View, DG Lumbar Spine Complete  Annual physical exam - Plan: Comprehensive metabolic panel, CBC w/Diff, Lipid panel  Hyperlipidemia, unspecified hyperlipidemia type - Plan: TSH  Chronic hepatitis C without hepatic coma (HCC) - Plan: Hepatitis c antibody (reflex)  -rec lidocaine patch otc pt declines muscle relaxer   -doing tens, stretches, rec prn tylenol  -if pain continues with h/o IVDU will do MRI mid and low back  -Xray mid and low back   F/u as sch fasting labs and f/u as sch   Pt is established with [facility] for psych health   I discussed the assessment and treatment plan with the patient. The patient was provided an opportunity to ask questions and all were answered. The patient agreed with the plan and demonstrated an understanding of the instructions.   The patient was advised to call back or seek an in-person evaluation if the symptoms worsen or if the condition fails to improve as anticipated.  Time spent 15 minutes Bevelyn Bucklesracy N McLean-Scocuzza, MD

## 2018-12-16 NOTE — Progress Notes (Signed)
Pre visit review using our clinic review tool, if applicable. No additional management support is needed unless otherwise documented below in the visit note. 

## 2018-12-17 ENCOUNTER — Other Ambulatory Visit: Payer: Self-pay | Admitting: Internal Medicine

## 2018-12-17 DIAGNOSIS — M5416 Radiculopathy, lumbar region: Secondary | ICD-10-CM

## 2018-12-17 DIAGNOSIS — Z87898 Personal history of other specified conditions: Secondary | ICD-10-CM

## 2018-12-17 DIAGNOSIS — F1991 Other psychoactive substance use, unspecified, in remission: Secondary | ICD-10-CM

## 2018-12-17 DIAGNOSIS — M5442 Lumbago with sciatica, left side: Secondary | ICD-10-CM

## 2018-12-17 DIAGNOSIS — M5441 Lumbago with sciatica, right side: Secondary | ICD-10-CM

## 2018-12-19 NOTE — Addendum Note (Signed)
Addended by: Quentin Ore on: 12/19/2018 03:27 PM   Modules accepted: Orders

## 2018-12-22 ENCOUNTER — Encounter: Payer: Self-pay | Admitting: Internal Medicine

## 2018-12-27 ENCOUNTER — Ambulatory Visit: Payer: BLUE CROSS/BLUE SHIELD

## 2018-12-31 ENCOUNTER — Ambulatory Visit: Payer: BLUE CROSS/BLUE SHIELD

## 2019-01-01 ENCOUNTER — Ambulatory Visit: Admission: RE | Admit: 2019-01-01 | Payer: BLUE CROSS/BLUE SHIELD | Source: Ambulatory Visit

## 2019-01-01 ENCOUNTER — Ambulatory Visit: Payer: BLUE CROSS/BLUE SHIELD

## 2019-02-03 ENCOUNTER — Telehealth: Payer: Self-pay | Admitting: Pharmacy Technician

## 2019-02-03 NOTE — Telephone Encounter (Signed)
Spoke with patient.  Mailing new patient packet.  Patient understands that medication assistance cannot be provided until eligibility is determined.  [facility]

## 2019-02-19 ENCOUNTER — Telehealth: Payer: BLUE CROSS/BLUE SHIELD | Admitting: Family

## 2019-02-19 DIAGNOSIS — J029 Acute pharyngitis, unspecified: Secondary | ICD-10-CM

## 2019-02-19 DIAGNOSIS — R059 Cough, unspecified: Secondary | ICD-10-CM

## 2019-02-19 DIAGNOSIS — R05 Cough: Secondary | ICD-10-CM

## 2019-02-19 MED ORDER — BENZONATATE 100 MG PO CAPS: 100.0000 mg | ORAL_CAPSULE | Freq: Three times a day (TID) | ORAL | 0 refills | Status: DC | PRN

## 2019-02-19 MED ORDER — ALBUTEROL SULFATE HFA 108 (90 BASE) MCG/ACT IN AERS: 2.0000 | INHALATION_SPRAY | Freq: Four times a day (QID) | RESPIRATORY_TRACT | 1 refills | Status: DC | PRN

## 2019-02-19 NOTE — Progress Notes (Signed)
Greater than 5 minutes, yet less than 10 minutes of time have been spent researching, coordinating, and implementing care for this patient today.  Thank you for the details you included in the comment boxes. Those details are very helpful in determining the best course of treatment for you and help [place] to provide the best care.  I have also sent Tessalon Perles 100mg , take 1 or 2 every 8 hours as needed for cough. I have also added an Albuterol inhaler, take 2 puffs every 6 hours as needed for shortness of breath.   ----------------------------------------------------------------------------------------------------  We are sorry that you are not feeling well.  Here is how we plan to help!  Your symptoms indicate a likely viral infection (Pharyngitis).   Pharyngitis is inflammation in the back of the throat which can cause a sore throat, scratchiness and sometimes difficulty swallowing.   Pharyngitis is typically caused by a respiratory virus and will just run its course.  Please keep in mind that your symptoms could last up to 10 days.  For throat pain, we recommend over the counter oral pain relief medications such as acetaminophen or aspirin, or anti-inflammatory medications such as ibuprofen or naproxen sodium.  Topical treatments such as oral throat lozenges or sprays may be used as needed.  Avoid close contact with loved ones, especially the very young and elderly.  Remember to wash your hands thoroughly throughout the day as this is the number one way to prevent the spread of infection and wipe down door knobs and counters with disinfectant.  After careful review of your answers, I would not recommend and antibiotic for your condition.  Antibiotics should not be used to treat conditions that we suspect are caused by viruses like the virus that causes the common cold or flu. However, some people can have Strep with atypical symptoms. You may need formal testing in clinic or office to confirm if your  symptoms continue or worsen.  Providers prescribe antibiotics to treat infections caused by bacteria. Antibiotics are very powerful in treating bacterial infections when they are used properly.  To maintain their effectiveness, they should be used only when necessary.  Overuse of antibiotics has resulted in the development of super bugs that are resistant to treatment!    Home Care:  Only take medications as instructed by your medical team.  Do not drink alcohol while taking these medications.  A steam or ultrasonic humidifier can help congestion.  You can place a towel over your head and breathe in the steam from hot water coming from a faucet.  Avoid close contacts especially the very young and the elderly.  Cover your mouth when you cough or sneeze.  Always remember to wash your hands.  Get Help Right Away If:  You develop worsening fever or throat pain.  You develop a severe head ache or visual changes.  Your symptoms persist after you have completed your treatment plan.  Make sure you  Understand these instructions.  Will watch your condition.  Will get help right away if you are not doing well or get worse.  Your e-visit answers were reviewed by a board certified advanced clinical practitioner to complete your personal care plan.  Depending on the condition, your plan could have included both over the counter or prescription medications.  If there is a problem please reply  once you have received a response from your provider.  Your safety is important to [place].  If you have drug allergies check your prescription carefully.  You can use MyChart to ask questions about todays visit, request a non-urgent call back, or ask for a work or school excuse for 24 hours related to this e-Visit. If it has been greater than 24 hours you will need to follow up with your provider, or enter a new e-Visit to address those concerns.  You will get an e-mail in the next two days asking  about your experience.  I hope that your e-visit has been valuable and will speed your recovery. Thank you for using e-visits.

## 2019-02-27 ENCOUNTER — Other Ambulatory Visit: Payer: BLUE CROSS/BLUE SHIELD

## 2019-03-03 ENCOUNTER — Ambulatory Visit: Payer: BLUE CROSS/BLUE SHIELD | Admitting: Internal Medicine

## 2019-03-03 DIAGNOSIS — Z0289 Encounter for other administrative examinations: Secondary | ICD-10-CM

## 2019-10-24 ENCOUNTER — Ambulatory Visit: Payer: BLUE CROSS/BLUE SHIELD | Attending: Internal Medicine

## 2019-10-24 DIAGNOSIS — Z23 Encounter for immunization: Secondary | ICD-10-CM

## 2019-10-24 NOTE — Progress Notes (Signed)
   Covid-19 Vaccination Clinic  Name:  [name]    MRN: [id number] DOB: 1980/09/24  10/24/2019  [name] was observed post Covid-19 immunization for 15 minutes without incident. She was provided with Vaccine Information Sheet and instruction to access the V-Safe system.   [name] was instructed to call 911 with any severe reactions post vaccine: Marland Kitchen Difficulty breathing  . Swelling of face and throat  . A fast heartbeat  . A bad rash all over body  . Dizziness and weakness   Immunizations Administered    Name Date Dose VIS Date Route   Pfizer COVID-19 Vaccine 10/24/2019 10:47 AM 0.3 mL 07/17/2019 Intramuscular   Manufacturer: [employer], [employer]   Lot: [id number]   NDC: [address]-1

## 2019-10-26 ENCOUNTER — Other Ambulatory Visit: Payer: Self-pay

## 2019-10-28 ENCOUNTER — Ambulatory Visit (INDEPENDENT_AMBULATORY_CARE_PROVIDER_SITE_OTHER): Payer: BLUE CROSS/BLUE SHIELD | Admitting: Internal Medicine

## 2019-10-28 ENCOUNTER — Encounter: Payer: Self-pay | Admitting: Internal Medicine

## 2019-10-28 ENCOUNTER — Other Ambulatory Visit: Payer: Self-pay

## 2019-10-28 ENCOUNTER — Telehealth: Payer: Self-pay | Admitting: Internal Medicine

## 2019-10-28 VITALS — BP 100/70 | HR 84 | Temp 97.7°F | Ht 67.0 in | Wt 164.6 lb

## 2019-10-28 DIAGNOSIS — Z1329 Encounter for screening for other suspected endocrine disorder: Secondary | ICD-10-CM

## 2019-10-28 DIAGNOSIS — E785 Hyperlipidemia, unspecified: Secondary | ICD-10-CM | POA: Diagnosis not present

## 2019-10-28 DIAGNOSIS — E663 Overweight: Secondary | ICD-10-CM

## 2019-10-28 DIAGNOSIS — Z1231 Encounter for screening mammogram for malignant neoplasm of breast: Secondary | ICD-10-CM

## 2019-10-28 DIAGNOSIS — F319 Bipolar disorder, unspecified: Secondary | ICD-10-CM

## 2019-10-28 DIAGNOSIS — Z0000 Encounter for general adult medical examination without abnormal findings: Secondary | ICD-10-CM | POA: Diagnosis not present

## 2019-10-28 DIAGNOSIS — Z1389 Encounter for screening for other disorder: Secondary | ICD-10-CM | POA: Diagnosis not present

## 2019-10-28 DIAGNOSIS — K732 Chronic active hepatitis, not elsewhere classified: Secondary | ICD-10-CM

## 2019-10-28 DIAGNOSIS — E669 Obesity, unspecified: Secondary | ICD-10-CM

## 2019-10-28 MED ORDER — PHENTERMINE HCL 37.5 MG PO TABS: 37.5000 mg | ORAL_TABLET | Freq: Every day | ORAL | 0 refills | Status: DC

## 2019-10-28 NOTE — Telephone Encounter (Signed)
We need to give pt hep A last dose  Have sch RN visit for this please but after completes both covid 19 vaccines and waits 1 month longer before hep A vaccine   Thanks TMS

## 2019-10-28 NOTE — Patient Instructions (Addendum)
[facility]  [address] [place]  [place], [place] [address]  [phone]  Delorise Jackson, [facility] [place], [place] [address]  [phone]  [phone] (Fax)  Hepatitis C carrier [facility]) (Primary Dx)   Phentermine tablets or capsules What is this medicine? PHENTERMINE (FEN ter meen) decreases your appetite. It is used with a reduced calorie diet and exercise to help you lose weight. This medicine may be used for other purposes; ask your health care provider or pharmacist if you have questions. COMMON BRAND NAME(S): Adipex-P, Atti-Plex P, Atti-Plex P Spansule, Fastin, Lomaira, Pro-Fast, Tara-8 What should I tell my health care provider before I take this medicine? They need to know if you have any of these conditions:  agitation or nervousness  diabetes  glaucoma  heart disease  high blood pressure  history of drug abuse or addiction  history of stroke  kidney disease  lung disease called Primary Pulmonary Hypertension (PPH)  taken an MAOI like Carbex, Eldepryl, Marplan, Nardil, or Parnate in last 14 days  taking stimulant medicines for attention disorders, weight loss, or to stay awake  thyroid disease  an unusual or allergic reaction to phentermine, other medicines, foods, dyes, or preservatives  pregnant or trying to get pregnant  breast-feeding How should I use this medicine? Take this medicine by mouth with a glass of water. Follow the directions on the prescription label. Take your medicine at regular intervals. Do not take it more often than directed. Do not stop taking except on your doctor's advice. Talk to your pediatrician regarding the use of this medicine in children. While this drug may be prescribed for children 17 years or older for selected conditions, precautions do apply. Overdosage: If you think you have taken too much of this medicine contact a poison control center or emergency room at  once. NOTE: This medicine is only for you. Do not share this medicine with others. What if I miss a dose? If you miss a dose, take it as soon as you can. If it is almost time for your next dose, take only that dose. Do not take double or extra doses. What may interact with this medicine? Do not take this medicine with any of the following medications:  MAOIs like Carbex, Eldepryl, Marplan, Nardil, and Parnate This medicine may also interact with the following medications:  alcohol  certain medicines for depression, anxiety, or psychotic disorders  certain medicines for high blood pressure  linezolid  medicines for colds or breathing difficulties like pseudoephedrine or phenylephrine  medicines for diabetes  sibutramine  stimulant medicines for attention disorders, weight loss, or to stay awake This list may not describe all possible interactions. Give your health care provider a list of all the medicines, herbs, non-prescription drugs, or dietary supplements you use. Also tell them if you smoke, drink alcohol, or use illegal drugs. Some items may interact with your medicine. What should I watch for while using this medicine? Visit your doctor or health care provider for regular checks on your progress. Do not stop taking except on your health care provider's advice. You may develop a severe reaction. Your health care provider will tell you how much medicine to take. Do not take this medicine close to bedtime. It may prevent you from sleeping. You may get drowsy or dizzy. Do not drive, use machinery, or do anything that needs mental alertness until you know how this medicine affects you. Do not stand or sit up  quickly, especially if you are an older patient. This reduces the risk of dizzy or fainting spells. Alcohol may increase dizziness and drowsiness. Avoid alcoholic drinks. This medicine may affect blood sugar levels. Ask your healthcare provider if changes in diet or medicines are  needed if you have diabetes. Women should inform their health care provider if they wish to become pregnant or think they might be pregnant. Losing weight while pregnant is not advised and may cause harm to the unborn child. Talk to your health care provider for more information. What side effects may I notice from receiving this medicine? Side effects that you should report to your doctor or health care professional as soon as possible:  allergic reactions like skin rash, itching or hives, swelling of the face, lips, or tongue  breathing problems  changes in emotions or moods  changes in vision  chest pain or chest tightness  fast, irregular heartbeat  feeling faint or lightheaded  increased blood pressure  irritable  restlessness  tremors  seizures  signs and symptoms of a stroke like changes in vision; confusion; trouble speaking or understanding; severe headaches; sudden numbness or weakness of the face, arm or leg; trouble walking; dizziness; loss of balance or coordination  unusually weak or tired Side effects that usually do not require medical attention (report to your doctor or health care professional if they continue or are bothersome):  changes in taste  constipation or diarrhea  dizziness  dry mouth  headache  trouble sleeping  upset stomach This list may not describe all possible side effects. Call your doctor for medical advice about side effects. You may report side effects to FDA at 1-800-FDA-1088. Where should I keep my medicine? Keep out of the reach of children. This medicine can be abused. Keep your medicine in a safe place to protect it from theft. Do not share this medicine with anyone. Selling or giving away this medicine is dangerous and against the law. This medicine may cause harm and death if it is taken by other adults, children, or pets. Return medicine that has not been used to an official disposal site. Contact the DEA at  484-664-3644 or your city/county government to find a site. If you cannot return the medicine, mix any unused medicine with a substance like cat litter or coffee grounds. Then throw the medicine away in a sealed container like a sealed bag or coffee can with a lid. Do not use the medicine after the expiration date. Store at room temperature between 20 and 25 degrees C (68 and 77 degrees F). Keep container tightly closed. NOTE: This sheet is a summary. It may not cover all possible information. If you have questions about this medicine, talk to your doctor, pharmacist, or health care provider.  2020 Elsevier/Gold Standard (2019-05-29 12:54:20)

## 2019-10-28 NOTE — Telephone Encounter (Signed)
We need to give pt hep A last dose  Have sch RN visit for this please

## 2019-10-28 NOTE — Progress Notes (Addendum)
Chief Complaint  Patient presents with  . Annual Exam  . Weight Gain    weight going up despite being on keto diet and exercising    Annual  1. C/w weight gain though gym 1 hour 5x per week and doing keto diet trying to eat healthy her dad is obese and frustrated she is not losing weight  2. H/o bipolar seeing Dr. Holly Bodily in [place] but interested in changing psychiatrist having mood swings currently does not have therapist. She tried Blase Mess for ADHD but gave her hot flashes so stopped on wellbutrin 300 mg xl qd, lamictal 100 mg qd trazadone 100 mg qd, geodon 40 mg qd, gabapentin 400 mg tid  GAD 7 8 and PHq 9 score 7 today   3. Chronic hep C s/p tx will repeat labs no f/u Dr. Kerry Dory since 2019 will do labs to f/u for now  4. Tobacco abuse congratulated on smoking since 05/2019 and avoiding substances previously addicted and did tx centers in [place] and [place] and still sober   Review of Systems  Constitutional: Negative for weight loss.  HENT: Negative for hearing loss.   Eyes: Negative for blurred vision.  Respiratory: Negative for shortness of breath.   Gastrointestinal: Negative for abdominal pain.  Musculoskeletal: Negative for falls and joint pain.  Skin: Negative for rash.  Neurological: Negative for headaches.  Psychiatric/Behavioral: Positive for depression.       +crying about inability to lose weight    Past Medical History:  Diagnosis Date  . Anxiety   . Bipolar 1 disorder (HCC)   . Depression   . Hyperlipidemia   . Substance abuse (HCC)    etoh, heroin, cocaine, opiates  . Tendonitis    L arm   . UTI (urinary tract infection)    Past Surgical History:  Procedure Laterality Date  . LEEP     2001  . TUBAL LIGATION     Family History  Problem Relation Age of Onset  . Cancer Mother        skin   . Alcohol abuse Father   . Depression Father   . Thyroid disease Father   . Stroke Father   . Obesity Father   . Cancer Maternal Grandmother        breast  .  Cancer Paternal Grandmother        breast    Social History   Socioeconomic History  . Marital status: Single    Spouse name: Not on file  . Number of children: Not on file  . Years of education: Not on file  . Highest education level: Not on file  Occupational History  . Not on file  Tobacco Use  . Smoking status: Current Every Day Smoker    Packs/day: 1.00    Types: Cigarettes  . Smokeless tobacco: Never Used  Substance and Sexual Activity  . Alcohol use: Yes    Comment: twisted tea and a few beers everyday  . Drug use: Yes    Types: Cocaine, Marijuana    Comment: today  . Sexual activity: Not on file  Other Topics Concern  . 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    H/o etoh and substance abuse clean x 11 months as of 08/28/2018   No guns    Wears seat belt, safe in relationship  Social Determinants of Health   Financial Resource Strain:   . Difficulty of Paying Living Expenses:   Food Insecurity:   . Worried About Charity fundraiser in the Last Year:   . Arboriculturist in the Last Year:   Transportation Needs:   . Film/video editor (Medical):   Marland Kitchen Lack of Transportation (Non-Medical):   Physical Activity:   . Days of Exercise per Week:   . Minutes of Exercise per Session:   Stress:   . Feeling of Stress :   Social Connections:   . Frequency of Communication with Friends and Family:   . Frequency of Social Gatherings with Friends and Family:   . Attends Religious Services:   . Active Member of Clubs or Organizations:   . Attends Archivist Meetings:   Marland Kitchen Marital Status:   Intimate Partner Violence:   . Fear of Current or Ex-Partner:   . Emotionally Abused:   Marland Kitchen Physically Abused:   . Sexually Abused:    Current Meds  Medication Sig  . buPROPion (WELLBUTRIN XL) 300 MG 24 hr tablet Take 1 tablet (300 mg total) by mouth daily.  Marland Kitchen  gabapentin (NEURONTIN) 400 MG capsule Take 1 capsule (400 mg total) by mouth 3 (three) times daily.  Marland Kitchen lamoTRIgine (LAMICTAL) 100 MG tablet Take 1 tablet (100 mg total) by mouth daily.  . naltrexone (DEPADE) 50 MG tablet Take 1 tablet (50 mg total) by mouth daily.  . traZODone (DESYREL) 100 MG tablet Take 1 tablet (100 mg total) by mouth at bedtime as needed for sleep.  . ziprasidone (GEODON) 40 MG capsule Take 1 capsule (40 mg total) by mouth daily.   Allergies  Allergen Reactions  . Chantix [Varenicline Tartrate]     Chest pressure and bugs crawling   . Latex Itching    gloves   No results found for this or any previous visit (from the past 2160 hour(s)). Objective  Body mass index is 25.78 kg/m. Wt Readings from Last 3 Encounters:  10/28/19 164 lb 9.6 oz (74.7 kg)  12/08/18 163 lb (73.9 kg)  10/09/18 162 lb (73.5 kg)   Temp Readings from Last 3 Encounters:  10/28/19 97.7 F (36.5 C) (Temporal)  12/08/18 98.3 F (36.8 C) (Oral)  10/09/18 98.6 F (37 C) (Oral)   BP Readings from Last 3 Encounters:  10/28/19 100/70  12/08/18 100/80  10/09/18 133/72   Pulse Readings from Last 3 Encounters:  10/28/19 84  12/08/18 87  10/09/18 80    Physical Exam Vitals and nursing note reviewed.  Constitutional:      Appearance: Normal appearance. She is well-developed, well-groomed and overweight.  HENT:     Head: Normocephalic and atraumatic.  Eyes:     Conjunctiva/sclera: Conjunctivae normal.     Pupils: Pupils are equal, round, and reactive to light.  Cardiovascular:     Rate and Rhythm: Normal rate and regular rhythm.     Heart sounds: Normal heart sounds. No murmur.  Pulmonary:     Effort: Pulmonary effort is normal.     Breath sounds: Normal breath sounds.  Abdominal:     General: Abdomen is flat. Bowel sounds are normal.  Skin:    General: Skin is warm and dry.     Comments: Tanned skin  Neurological:     General: No focal deficit present.     Mental Status: She is  alert and oriented to person, place, and time. Mental status is at baseline.  Gait: Gait normal.  Psychiatric:        Attention and Perception: Attention and perception normal.        Mood and Affect: Mood and affect normal.        Speech: Speech normal.        Behavior: Behavior normal. Behavior is cooperative.        Thought Content: Thought content normal.        Cognition and Memory: Cognition and memory normal.        Judgment: Judgment normal.     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 Helane Rima in [place] to call and sch if wants to change psych  Cont meds Consider therapy as well  Chronic active hepatitis (HCC) - Plan: Hepatitis C antibody F/u Dr. Wyvonnia Lora [facility]  Given # to call for appt   Overweight/Obesity (BMI 30-39.9) - Plan: phentermine (ADIPEX-P) 37.5 MG tablet, phentermine (ADIPEX-P) 37.5 MG tablet F/u 2-3 months   Provider: Dr. French Ana McLean-Scocuzza-[facility]

## 2019-10-29 NOTE — Telephone Encounter (Signed)
No answer, no voicemail.

## 2019-10-29 NOTE — Telephone Encounter (Signed)
See other phone encounter from 10/28/19

## 2019-11-05 LAB — URINALYSIS, ROUTINE W REFLEX MICROSCOPIC
Bilirubin, UA: NEGATIVE
Glucose, UA: NEGATIVE
Ketones, UA: NEGATIVE
Nitrite, UA: NEGATIVE
Protein,UA: NEGATIVE
RBC, UA: NEGATIVE
Specific Gravity, UA: 1.011 (ref 1.005–1.030)
Urobilinogen, Ur: 0.2 mg/dL (ref 0.2–1.0)
pH, UA: 6.5 (ref 5.0–7.5)

## 2019-11-05 LAB — COMPREHENSIVE METABOLIC PANEL
ALT: 7 IU/L (ref 0–32)
AST: 13 IU/L (ref 0–40)
Albumin/Globulin Ratio: 1.8 (ref 1.2–2.2)
Albumin: 4.3 g/dL (ref 3.8–4.8)
Alkaline Phosphatase: 59 IU/L (ref 39–117)
BUN/Creatinine Ratio: 16 (ref 9–23)
BUN: 14 mg/dL (ref 6–24)
Bilirubin Total: 0.2 mg/dL (ref 0.0–1.2)
CO2: 18 mmol/L — ABNORMAL LOW (ref 20–29)
Calcium: 9.7 mg/dL (ref 8.7–10.2)
Chloride: 99 mmol/L (ref 96–106)
Creatinine, Ser: 0.85 mg/dL (ref 0.57–1.00)
GFR calc Af Amer: 99 mL/min/{1.73_m2} (ref >59)
GFR calc non Af Amer: 86 mL/min/{1.73_m2} (ref >59)
Globulin, Total: 2.4 g/dL (ref 1.5–4.5)
Glucose: 83 mg/dL (ref 65–99)
Potassium: 4.9 mmol/L (ref 3.5–5.2)
Sodium: 137 mmol/L (ref 134–144)
Total Protein: 6.7 g/dL (ref 6.0–8.5)

## 2019-11-05 LAB — CBC WITH DIFFERENTIAL/PLATELET
Basophils Absolute: 0 10*3/uL (ref 0.0–0.2)
Basos: 1 %
EOS (ABSOLUTE): 0.1 10*3/uL (ref 0.0–0.4)
Eos: 2 %
Hematocrit: 41.2 % (ref 34.0–46.6)
Hemoglobin: 14 g/dL (ref 11.1–15.9)
Immature Grans (Abs): 0 10*3/uL (ref 0.0–0.1)
Immature Granulocytes: 0 %
Lymphocytes Absolute: 1.7 10*3/uL (ref 0.7–3.1)
Lymphs: 36 %
MCH: 30.6 pg (ref 26.6–33.0)
MCHC: 34 g/dL (ref 31.5–35.7)
MCV: 90 fL (ref 79–97)
Monocytes Absolute: 0.4 10*3/uL (ref 0.1–0.9)
Monocytes: 9 %
Neutrophils Absolute: 2.5 10*3/uL (ref 1.4–7.0)
Neutrophils: 52 %
Platelets: 200 10*3/uL (ref 150–450)
RBC: 4.57 x10E6/uL (ref 3.77–5.28)
RDW: 12.3 % (ref 11.7–15.4)
WBC: 4.7 10*3/uL (ref 3.4–10.8)

## 2019-11-05 LAB — LIPID PANEL
Chol/HDL Ratio: 3.9 ratio (ref 0.0–4.4)
Cholesterol, Total: 224 mg/dL — ABNORMAL HIGH (ref 100–199)
HDL: 57 mg/dL (ref >39)
LDL Chol Calc (NIH): 144 mg/dL — ABNORMAL HIGH (ref 0–99)
Triglycerides: 128 mg/dL (ref 0–149)
VLDL Cholesterol Cal: 23 mg/dL (ref 5–40)

## 2019-11-05 LAB — MICROSCOPIC EXAMINATION
Bacteria, UA: NONE SEEN
Casts: NONE SEEN /lpf
RBC, Urine: NONE SEEN /hpf (ref 0–2)

## 2019-11-05 LAB — TSH: TSH: 1.21 u[IU]/mL (ref 0.450–4.500)

## 2019-11-05 LAB — HEPATITIS C ANTIBODY: Hep C Virus Ab: >11 s/co ratio — ABNORMAL HIGH (ref 0.0–0.9)

## 2019-11-11 ENCOUNTER — Encounter: Payer: Self-pay | Admitting: Internal Medicine

## 2019-11-11 LAB — SPECIMEN STATUS REPORT

## 2019-11-11 LAB — HEPATITIS C VRS RNA DETECT BY PCR-QUAL: HCV RNA NAA Qualitative: NEGATIVE

## 2019-11-14 ENCOUNTER — Ambulatory Visit: Payer: BLUE CROSS/BLUE SHIELD | Attending: Internal Medicine

## 2019-11-14 DIAGNOSIS — Z23 Encounter for immunization: Secondary | ICD-10-CM

## 2019-11-14 NOTE — Progress Notes (Signed)
   Covid-19 Vaccination Clinic  Name:  [name]    MRN: [id number] DOB: 10/03/1980  11/14/2019  [name] was observed post Covid-19 immunization for 15 minutes without incident. She was provided with Vaccine Information Sheet and instruction to access the V-Safe system.   [name] was instructed to call 911 with any severe reactions post vaccine: Marland Kitchen Difficulty breathing  . Swelling of face and throat  . A fast heartbeat  . A bad rash all over body  . Dizziness and weakness   Immunizations Administered    Name Date Dose VIS Date Route   Pfizer COVID-19 Vaccine 11/14/2019 10:25 AM 0.3 mL 07/17/2019 Intramuscular   Manufacturer: [employer], [employer]   Lot: [id number]   NDC: [address]-9

## 2019-11-16 NOTE — Telephone Encounter (Signed)
Scheduled for 5/13 at 8:30 am

## 2019-12-17 ENCOUNTER — Ambulatory Visit: Payer: BLUE CROSS/BLUE SHIELD

## 2019-12-17 ENCOUNTER — Encounter: Payer: Self-pay | Admitting: Internal Medicine

## 2019-12-17 ENCOUNTER — Other Ambulatory Visit: Payer: Self-pay

## 2019-12-28 LAB — HM MAMMOGRAPHY: HM Mammogram: ABNORMAL — AB (ref 0–4)

## 2019-12-30 ENCOUNTER — Telehealth: Payer: Self-pay | Admitting: Internal Medicine

## 2019-12-30 ENCOUNTER — Encounter: Payer: Self-pay | Admitting: Internal Medicine

## 2019-12-30 DIAGNOSIS — R921 Mammographic calcification found on diagnostic imaging of breast: Secondary | ICD-10-CM

## 2019-12-30 DIAGNOSIS — R928 Other abnormal and inconclusive findings on diagnostic imaging of breast: Secondary | ICD-10-CM

## 2019-12-30 NOTE — Telephone Encounter (Signed)
Left message to return call 

## 2019-12-30 NOTE — Telephone Encounter (Signed)
Patient informed and verbalized understanding.  She is scheduled for additional imaging tomorrow 12/31/19.

## 2019-12-30 NOTE — Telephone Encounter (Signed)
12/28/19 mammogram results Left breast with calcifications and not symmetrical same appearance as right breast   [facility] is rec. Diagnotic mammogram and [place] left breast  Pt needs to contact [facility] [phone] to schedule this for further work up  Hopefully results form this will be normal   Rasheeda can we push orders over to [facility] ?  Thanks [employer]

## 2019-12-30 NOTE — Telephone Encounter (Signed)
Pt called back returning your call °

## 2019-12-31 LAB — HM MAMMOGRAPHY: HM Mammogram: ABNORMAL — AB (ref 0–4)

## 2020-01-01 ENCOUNTER — Telehealth: Payer: Self-pay | Admitting: Internal Medicine

## 2020-01-01 NOTE — Telephone Encounter (Signed)
No answer, no voicemail.

## 2020-01-01 NOTE — Telephone Encounter (Signed)
[facility] mammogram 12/31/19 report  Mammogram 12/28/19 then 12/31/19 abnormal needed left breast dx mammo and [place] 1.2 cm fibroadenoma/cyst and benign cysts   f/u in 6 months [facility] consider aspiration or bx pt wants attempted aspiration/bx left breast simple cysts  She is to call [facility] back in 6 months to get this scheduled  TMS

## 2020-01-08 NOTE — Telephone Encounter (Signed)
Patient informed and verbalized understanding.  States she is scheduled for a biopsy 01/13/20.

## 2020-01-15 ENCOUNTER — Telehealth: Payer: Self-pay | Admitting: Internal Medicine

## 2020-01-15 NOTE — Telephone Encounter (Signed)
Faxed biopsy order for left breast mass [facility] [phone]

## 2020-01-15 NOTE — Telephone Encounter (Signed)
Faxed prescription refill request to [facility] [phone]

## 2020-01-15 NOTE — Telephone Encounter (Signed)
Faxed biopsy order for left breast to [facility] [phone]

## 2020-02-09 ENCOUNTER — Ambulatory Visit: Payer: BLUE CROSS/BLUE SHIELD | Admitting: Internal Medicine

## 2020-04-08 ENCOUNTER — Other Ambulatory Visit: Payer: BLUE CROSS/BLUE SHIELD

## 2020-04-08 ENCOUNTER — Other Ambulatory Visit: Payer: Self-pay | Admitting: Critical Care Medicine

## 2020-04-08 DIAGNOSIS — Z20822 Contact with and (suspected) exposure to covid-19: Secondary | ICD-10-CM

## 2020-04-09 LAB — NOVEL CORONAVIRUS, NAA: SARS-CoV-2, NAA: NOT DETECTED

## 2020-08-11 ENCOUNTER — Other Ambulatory Visit: Payer: Self-pay

## 2020-08-11 DIAGNOSIS — Z20822 Contact with and (suspected) exposure to covid-19: Secondary | ICD-10-CM

## 2020-08-16 LAB — NOVEL CORONAVIRUS, NAA: SARS-CoV-2, NAA: NOT DETECTED

## 2020-10-05 ENCOUNTER — Ambulatory Visit: Payer: 59 | Admitting: Internal Medicine

## 2020-10-26 ENCOUNTER — Encounter: Payer: Self-pay | Admitting: Internal Medicine

## 2020-11-25 ENCOUNTER — Ambulatory Visit: Payer: 59 | Admitting: Internal Medicine

## 2020-11-25 ENCOUNTER — Other Ambulatory Visit: Payer: Self-pay

## 2020-11-25 ENCOUNTER — Encounter: Payer: Self-pay | Admitting: Internal Medicine

## 2020-11-25 VITALS — BP 98/78 | HR 86 | Temp 98.1°F | Ht 67.0 in | Wt 172.6 lb

## 2020-11-25 DIAGNOSIS — E669 Obesity, unspecified: Secondary | ICD-10-CM | POA: Diagnosis not present

## 2020-11-25 DIAGNOSIS — Z23 Encounter for immunization: Secondary | ICD-10-CM

## 2020-11-25 DIAGNOSIS — N938 Other specified abnormal uterine and vaginal bleeding: Secondary | ICD-10-CM | POA: Diagnosis not present

## 2020-11-25 DIAGNOSIS — N946 Dysmenorrhea, unspecified: Secondary | ICD-10-CM | POA: Diagnosis not present

## 2020-11-25 DIAGNOSIS — Z1231 Encounter for screening mammogram for malignant neoplasm of breast: Secondary | ICD-10-CM

## 2020-11-25 DIAGNOSIS — E663 Overweight: Secondary | ICD-10-CM | POA: Diagnosis not present

## 2020-11-25 DIAGNOSIS — Z111 Encounter for screening for respiratory tuberculosis: Secondary | ICD-10-CM

## 2020-11-25 DIAGNOSIS — Z13220 Encounter for screening for lipoid disorders: Secondary | ICD-10-CM

## 2020-11-25 DIAGNOSIS — Z0184 Encounter for antibody response examination: Secondary | ICD-10-CM

## 2020-11-25 DIAGNOSIS — Z1389 Encounter for screening for other disorder: Secondary | ICD-10-CM

## 2020-11-25 DIAGNOSIS — Z0000 Encounter for general adult medical examination without abnormal findings: Secondary | ICD-10-CM

## 2020-11-25 DIAGNOSIS — N6002 Solitary cyst of left breast: Secondary | ICD-10-CM

## 2020-11-25 DIAGNOSIS — Z1329 Encounter for screening for other suspected endocrine disorder: Secondary | ICD-10-CM

## 2020-11-25 MED ORDER — PHENTERMINE HCL 37.5 MG PO TABS: 37.5000 mg | ORAL_TABLET | Freq: Every day | ORAL | 0 refills | Status: DC

## 2020-11-25 NOTE — Patient Instructions (Addendum)
Results for [name], [name] (MRN [id number]) as of 11/25/2020 07:53  Ref. Range 02/24/2018 07:58  Hepatitis B Surface Ag Latest Ref Range: NON-REACTI  NON-REACTIVE  Hepatitis B-Post Latest Ref Range: > OR = 10 mIU/mL 24    Track down childhood immunization record  Think about 3rd pfizer    Tdap (Tetanus, Diphtheria, Pertussis) Vaccine: What You Need to Know 1. Why get vaccinated? Tdap vaccine can prevent tetanus, diphtheria, and pertussis. Diphtheria and pertussis spread from person to person. Tetanus enters the body through cuts or wounds.  TETANUS (T) causes painful stiffening of the muscles. Tetanus can lead to serious health problems, including being unable to open the mouth, having trouble swallowing and breathing, or death.  DIPHTHERIA (D) can lead to difficulty breathing, heart failure, paralysis, or death.  PERTUSSIS (aP), also known as "whooping cough," can cause uncontrollable, violent coughing that makes it hard to breathe, eat, or drink. Pertussis can be extremely serious especially in babies and young children, causing pneumonia, convulsions, brain damage, or death. In teens and adults, it can cause weight loss, loss of bladder control, passing out, and rib fractures from severe coughing. 2. Tdap vaccine Tdap is only for children 7 years and older, adolescents, and adults.  Adolescents should receive a single dose of Tdap, preferably at age 92 or 12 years. Pregnant people should get a dose of Tdap during every pregnancy, preferably during the early part of the third trimester, to help protect the newborn from pertussis. Infants are most at risk for severe, life-threatening complications from pertussis. Adults who have never received Tdap should get a dose of Tdap. Also, adults should receive a booster dose of either Tdap or Td (a different vaccine that protects against tetanus and diphtheria but not pertussis) every 10 years, or after 5 years in the case of a severe or dirty  wound or burn. Tdap may be given at the same time as other vaccines. 3. Talk with your health care provider Tell your vaccine provider if the person getting the vaccine:  Has had an allergic reaction after a previous dose of any vaccine that protects against tetanus, diphtheria, or pertussis, or has any severe, life-threatening allergies  Has had a coma, decreased level of consciousness, or prolonged seizures within 7 days after a previous dose of any pertussis vaccine (DTP, DTaP, or Tdap)  Has seizures or another nervous system problem  Has ever had Guillain-Barr Syndrome (also called "GBS")  Has had severe pain or swelling after a previous dose of any vaccine that protects against tetanus or diphtheria In some cases, your health care provider may decide to postpone Tdap vaccination until a future visit. People with minor illnesses, such as a cold, may be vaccinated. People who are moderately or severely ill should usually wait until they recover before getting Tdap vaccine.  Your health care provider can give you more information. 4. Risks of a vaccine reaction  Pain, redness, or swelling where the shot was given, mild fever, headache, feeling tired, and nausea, vomiting, diarrhea, or stomachache sometimes happen after Tdap vaccination. People sometimes faint after medical procedures, including vaccination. Tell your provider if you feel dizzy or have vision changes or ringing in the ears.  As with any medicine, there is a very remote chance of a vaccine causing a severe allergic reaction, other serious injury, or death. 5. What if there is a serious problem? An allergic reaction could occur after the vaccinated person leaves the clinic. If you see signs of a  severe allergic reaction (hives, swelling of the face and throat, difficulty breathing, a fast heartbeat, dizziness, or weakness), call 9-1-1 and get the person to the nearest hospital. For other signs that concern you, call your  health care provider.  Adverse reactions should be reported to the Vaccine Adverse Event Reporting System (VAERS). Your health care provider will usually file this report, or you can do it yourself. Visit the VAERS website at www.vaers.LAgents.no or call (607) 818-9485. VAERS is only for reporting reactions, and VAERS staff members do not give medical advice. 6. The National Vaccine Injury Compensation Program The [employer] Vaccine Injury Compensation Program (VICP) is a federal program that was created to compensate people who may have been injured by certain vaccines. Claims regarding alleged injury or death due to vaccination have a time limit for filing, which may be as short as two years. Visit the VICP website at SpiritualWord.at or call 380-399-5469 to learn about the program and about filing a claim. 7. How can I learn more?  Ask your health care provider.  Call your local or state health department.  Visit the website of the Food and Drug Administration (FDA) for vaccine package inserts and additional information at FinderList.no.  Contact the Centers for Disease Control and Prevention (CDC): ? Call 740-704-2911 (1-800-CDC-INFO) or ? Visit CDC's website at PicCapture.uy. Vaccine Information Statement Tdap (Tetanus, Diphtheria, Pertussis) Vaccine (03/11/2020) This information is not intended to replace advice given to you by your health care provider. Make sure you discuss any questions you have with your health care provider. Document Revised: 04/06/2020 Document Reviewed: 04/06/2020 Elsevier Patient Education  2021 Elsevier Inc.  Dysmenorrhea Dysmenorrhea refers to cramps caused by the muscles of the uterus tightening (contracting) during a menstrual period. Dysmenorrhea may be mild, or it may be severe enough to interfere with everyday activities for a few days each month. Primary dysmenorrhea is menstrual cramps that last a  couple of days when a female starts having menstrual periods or soon after. As a female gets older or has a baby, the cramps will usually lessen or disappear. Secondary dysmenorrhea begins later in life and is caused by a disorder in the reproductive system. It lasts longer, and it may cause more pain than primary dysmenorrhea. The pain may start before the period and last a few days after the period. What are the causes? Dysmenorrhea is usually caused by an underlying problem, such as:  Endometriosis. The tissue that lines the uterus (endometrium) growing outside of the uterus in other areas of the body.  Adenomyosis. Endometrial tissue growing into the muscular walls of the uterus.  Pelvic congestive syndrome. Blood vessels in the pelvis that fill with blood just before the menstrual period.  Overgrowth of cells (polyps) in the endometrium or the lower part of the uterus (cervix).  Uterine prolapse. The uterus dropping down into the vagina due to stretched or weak muscles.  Bladder problems, such as infection or inflammation.  Intestinal problems, such as a tumor or irritable bowel syndrome.  Cancer of the reproductive organs or bladder. Other causes of this condition may result from:  A severely tipped uterus.  A cervix that is closed or has a small opening.  Noncancerous (benign) tumors in the uterus (fibroids).  Pelvic inflammatory disease (PID).  Pelvic scarring (adhesions) from a previous surgery.  An ovarian cyst.  An IUD (intrauterine device). What increases the risk? You are more likely to develop this condition if:  You are younger than 41 years old.  You  started puberty early.  You have irregular or heavy bleeding.  You have never given birth.  You have a family history of dysmenorrhea.  You smoke or use nicotine products.  You have high body weight or a low body weight. What are the signs or symptoms? Symptoms of this condition include:  Cramping,  throbbing pain in lower abdomen or lower back, or a feeling of fullness in the lower abdomen.  Periods lasting for longer than 7 days.  Headaches.  Bloating.  Fatigue.  Nausea or vomiting.  Diarrhea or loose stools.  Sweating or dizziness. How is this diagnosed? This condition may be diagnosed based on:  Your symptoms.  Your medical history.  A physical exam.  Blood tests.  A Pap test. This is a test in which cells from the cervix are tested for signs of cancer or infection.  A pregnancy test. You may also have other tests, including:  Imaging tests, such as: ? Ultrasound. ? A procedure to remove and examine a sample of endometrial tissue (dilation and curettage, D&C). ? A procedure to visually examine the inside of:  The uterus (hysteroscopy).  The abdomen or pelvis (laparoscopy).  The bladder (cystoscopy). ? X-rays.  CT scan.  MRI. How is this treated? Treatment depends on the cause of the dysmenorrhea. Treatment may include medicines, such as:  Pain medicines.  Hormone replacement therapy. ? Injections of progesterone to stop the menstrual period. ? Birth control pills that contain the hormone progesterone. ? An IUD that contains the hormone progesterone.  NSAIDs, such as ibuprofen. These may help to stop the production of hormones that cause cramps.  Antidepressant medicines. Other treatment may include:  Surgery to remove adhesions, endometriosis, ovarian cysts, fibroids, or the entire uterus (hysterectomy).  Endometrial ablation. This is a procedure to destroy the endometrium.  Presacral neurectomy. This is a procedure to cut the nerves in the bottom of the spine (sacrum) that go to the reproductive organs.  Sacral nerve stimulation. This is a procedure to apply an electric current to nerves in the sacrum.  Exercise and physical therapy.  Meditation, yoga, and acupuncture. Work with your health care provider to determine what treatment or  combination of treatments is best for you. Follow these instructions at home: Relieving pain and cramping  If directed, apply heat to your lower back or abdomen when you experience pain or cramps. Use the heat source that your health care provider recommends, such as a moist heat pack or a heating pad. ? Place a towel between your skin and the heat source. ? Leave the heat on for 20-30 minutes. ? Remove the heat if your skin turns bright red. This is especially important if you are unable to feel pain, heat, or cold. You may have a greater risk of getting burned.  Do not sleep with a heating pad on.  Exercise. Activities such as walking, swimming, or biking can help to relieve cramps.  Massage your lower back or abdomen to help relieve pain.   General instructions  Take over-the-counter and prescription medicines only as told by your health care provider.  Ask your health care provider if the medicine prescribed to you requires you to avoid driving or using machinery.  Avoid alcohol and caffeine during and right before your period. These can make cramps worse.  Do not use any products that contain nicotine or tobacco. These products include cigarettes, chewing tobacco, and vaping devices, such as e-cigarettes. If you need help quitting, ask your health care  provider.  Keep all follow-up visits. This is important. Contact a health care provider if:  You have pain that gets worse or does not get better with medicine.  You have pain with sex.  You develop nausea or vomiting with your period that is not controlled with medicine. Get help right away if:  You faint. Summary  Dysmenorrhea refers to cramps caused by the muscles of the uterus tightening (contracting) during a menstrual period.  Dysmenorrhea may be mild, or it may be severe enough to interfere with everyday activities for a few days each month.  Treatment depends on the cause of the dysmenorrhea.  Work with your  health care provider to determine what treatment or combination of treatments is best for you. This information is not intended to replace advice given to you by your health care provider. Make sure you discuss any questions you have with your health care provider. Document Revised: 03/09/2020 Document Reviewed: 03/09/2020 Elsevier Patient Education  2021 Elsevier Inc.  Dysfunctional Uterine Bleeding Dysfunctional uterine bleeding is abnormal bleeding from the uterus. Dysfunctional uterine bleeding includes:  A menstrual period that comes earlier or later than usual.  A menstrual period that is lighter or heavier than usual, or has large blood clots.  Vaginal bleeding between menstrual periods.  Skipping one or more menstrual periods.  Vaginal bleeding after sex.  Vaginal bleeding after menopause. Follow these instructions at home: Eating and drinking  Eat well-balanced meals. Include foods that are high in iron, such as liver, meat, shellfish, green leafy vegetables, and eggs.  To prevent or treat constipation, your health care provider may recommend that you: ? Drink enough fluid to keep your urine pale yellow. ? Take over-the-counter or prescription medicines. ? Eat foods that are high in fiber, such as beans, whole grains, and fresh fruits and vegetables. ? Limit foods that are high in fat and processed sugars, such as fried or sweet foods.   Medicines  Take over-the-counter and prescription medicines only as told by your health care provider.  Do not change medicines without talking with your health care provider.  Aspirin or medicines that contain aspirin may make the bleeding worse. Do not take those medicines: ? During the week before your menstrual period. ? During your menstrual period.  If you were prescribed iron pills, take them as told by your health care provider. Iron pills help to replace iron that your body loses because of this condition. Activity  If you  need to change your sanitary pad or tampon more than one time every 2 hours: ? Lie in bed with your feet raised (elevated). ? Place a cold pack on your lower abdomen. ? Rest as much as possible until the bleeding stops or slows down.  Do not try to lose weight until the bleeding has stopped and your blood iron level is back to normal. General instructions  For two months, write down: ? When your menstrual period starts. ? When your menstrual period ends. ? When any abnormal vaginal bleeding occurs. ? What problems you notice.  Keep all follow up visits as told by your health care provider. This is important.   Contact a health care provider if you:  Feel light-headed or weak.  Have nausea and vomiting.  Cannot eat or drink without vomiting.  Feel dizzy or have diarrhea while you are taking medicines.  Are taking birth control pills or hormones, and you want to change them or stop taking them. Get help right away if:  You develop a fever or chills.  You need to change your sanitary pad or tampon more than one time per hour.  Your vaginal bleeding becomes heavier, or your flow contains clots more often.  You develop pain in your abdomen.  You lose consciousness.  You develop a rash. Summary  Dysfunctional uterine bleeding is abnormal bleeding from the uterus.  It includes menstrual bleeding of abnormal duration, volume, or regularity.  Bleeding after sex and after menopause are also considered dysfunctional uterine bleeding. This information is not intended to replace advice given to you by your health care provider. Make sure you discuss any questions you have with your health care provider. Document Revised: 01/01/2018 Document Reviewed: 01/01/2018 Elsevier Patient Education  2021 [employer]

## 2020-11-25 NOTE — Progress Notes (Signed)
Chief Complaint  Patient presents with  . Follow-up  . Weight Gain  . Menorrhagia   Annual  1. Overweight and wants to resume adipex fh obesity and thyroid issues on dads side 2. DUB and cramping end of 10/2020 2.5 days of cramping and clots larger than dime to quarter and had to wear 3 pads h/o LEEP  Pap 05/2018 neg except yeast and tubal ligation  3. Needs vaccines and labs for school will give Tdap today not sure exact day     Review of Systems  Constitutional: Negative for weight loss.  HENT: Negative for hearing loss.   Eyes: Negative for blurred vision.  Respiratory: Negative for shortness of breath.   Cardiovascular: Negative for chest pain.  Gastrointestinal: Positive for abdominal pain.  Genitourinary:       +painful cycles   Musculoskeletal: Negative for falls and joint pain.  Skin: Negative for rash.  Neurological: Negative for headaches.  Psychiatric/Behavioral: Negative for depression. The patient is not nervous/anxious.        Sober x 4 years     Past Medical History:  Diagnosis Date  . Anxiety   . Bipolar 1 disorder (HCC)   . COVID-19    08/2018  . Depression   . Hyperlipidemia   . Substance abuse (HCC)    etoh, heroin, cocaine, opiates  . Tendonitis    L arm   . UTI (urinary tract infection)    Past Surgical History:  Procedure Laterality Date  . LEEP     2001  . TUBAL LIGATION     Family History  Problem Relation Age of Onset  . Cancer Mother        skin   . Alcohol abuse Father   . Depression Father   . Thyroid disease Father   . Stroke Father   . Obesity Father   . Cancer Maternal Grandmother        breast  . Cancer Paternal Grandmother        breast    Social History   Socioeconomic History  . Marital status: Single    Spouse name: Not on file  . Number of children: Not on file  . Years of education: Not on file  . Highest education level: Not on file  Occupational History  . Not on file  Tobacco Use  . Smoking status:  Former Smoker    Packs/day: 1.00    Types: Cigarettes    Quit date: 11/05/2019    Years since quitting: 1.0  . Smokeless tobacco: Never Used  Substance and Sexual Activity  . Alcohol use: Yes    Comment: twisted tea and a few beers everyday  . Drug use: Yes    Types: Cocaine, Marijuana    Comment: today  . Sexual activity: Not on file  Other Topics Concern  . 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 buPROPion (WELLBUTRIN XL) 300 MG 24 hr tablet Take 1 tablet (300 mg total) by mouth daily.  Marland Kitchen gabapentin (NEURONTIN) 400 MG capsule Take 1 capsule (400 mg total) by mouth 3 (three) times daily.  Marland Kitchen lamoTRIgine (LAMICTAL) 100 MG tablet Take 1 tablet (100 mg total) by mouth daily.  . traZODone (DESYREL) 100 MG tablet Take 1 tablet (100 mg total) by mouth at bedtime as needed for sleep.   Allergies  Allergen Reactions  . Chantix [Varenicline Tartrate]     Chest pressure and bugs crawling   . Latex Itching    gloves   No results found for this or any previous visit (from the past 2160 hour(s)). Objective  Body mass index is 27.03 kg/m. Wt Readings from Last 3 Encounters:  11/25/20 172 lb 9.6 oz (78.3 kg)  10/28/19 164 lb 9.6 oz (74.7 kg)  12/08/18 163 lb (73.9 kg)   Temp Readings from Last 3 Encounters:  11/25/20 98.1 F (36.7 C) (Oral)  10/28/19 97.7 F (36.5 C) (Temporal)  12/08/18 98.3 F (36.8 C) (Oral)    BP Readings from Last 3 Encounters:  11/25/20 98/78  10/28/19 100/70  12/08/18 100/80   Pulse Readings from Last 3 Encounters:  11/25/20 86  10/28/19 84  12/08/18 87    Physical Exam Vitals and nursing note reviewed.  Constitutional:      Appearance: Normal appearance. She is well-developed, well-groomed and overweight.  HENT:     Head: Normocephalic and atraumatic.  Cardiovascular:     Rate and Rhythm: Normal rate and regular rhythm.     Heart sounds: Normal heart sounds. No murmur heard.   Pulmonary:     Effort: Pulmonary effort is normal.     Breath sounds: Normal breath sounds.  Abdominal:     Tenderness: There is no abdominal tenderness.  Skin:    General: Skin is warm and dry.  Neurological:     General: No focal deficit present.     Mental Status: She is alert and oriented to person, place, and time. Mental status is at baseline.     Gait: Gait normal.  Psychiatric:        Attention and Perception: Attention and perception normal.        Mood and Affect: Mood and affect normal.        Speech: Speech normal.        Behavior: Behavior normal. Behavior is cooperative.        Thought Content: Thought content normal.        Cognition and Memory: Cognition and memory normal.        Judgment: Judgment normal.     Assessment  Plan  Annual physical exam - Plan: Comprehensive metabolic panel, Lipid panel, CBC with Differential/Platelet, TSH, Urinalysis, Routine w reflex microscopic, QuantiFERON-TB Gold Plus, Measles/Mumps/Rubella Immunity, Varicella zoster antibody, IgG Had flu shotnot had  covid vx had 2/2 consider booster had covid Hep A 1/2 vaccinesstill needs 1 more Tdap given today Hep B immune rec healthy diet and exercise Pap had 05/2018 +candida mammo due 12/30/20 [facility]  Colonoscopy age 6  rec healthy diet and exercise   Overweight (BMI 25.0-29.9) - Plan: phentermine (ADIPEX-P) 37.5 MG tablet, phentermine (ADIPEX-P) 37.5 MG tablet  DUB  (dysfunctional uterine bleeding) - Plan: Ambulatory referral to Obstetrics / Gynecology Dysmenorrhea - Plan: Ambulatory referral to Obstetrics / Gynecology   Screening-pulmonary  TB - Plan: QuantiFERON-TB Gold Plus Immunity status testing - Plan: Measles/Mumps/Rubella Immunity, Varicella zoster antibody, IgG For school      Provider: Dr. French Ana McLean-Scocuzza-[facility] Medicine

## 2020-11-29 ENCOUNTER — Other Ambulatory Visit: Payer: 59

## 2020-12-07 NOTE — Progress Notes (Signed)
Printed and given to CMA for provider.

## 2020-12-14 IMAGING — CR DG CHEST 2V
1 series · 2 of 2 positions shown · non-contrast
Comparison: 10/25/2016

CLINICAL DATA: Fever and cough for 2 days

EXAM:
CHEST - 2 VIEW

[Series 1: w chest pa · 0.14mm/px · 2 of 2 slices shown]
[im 1/2]
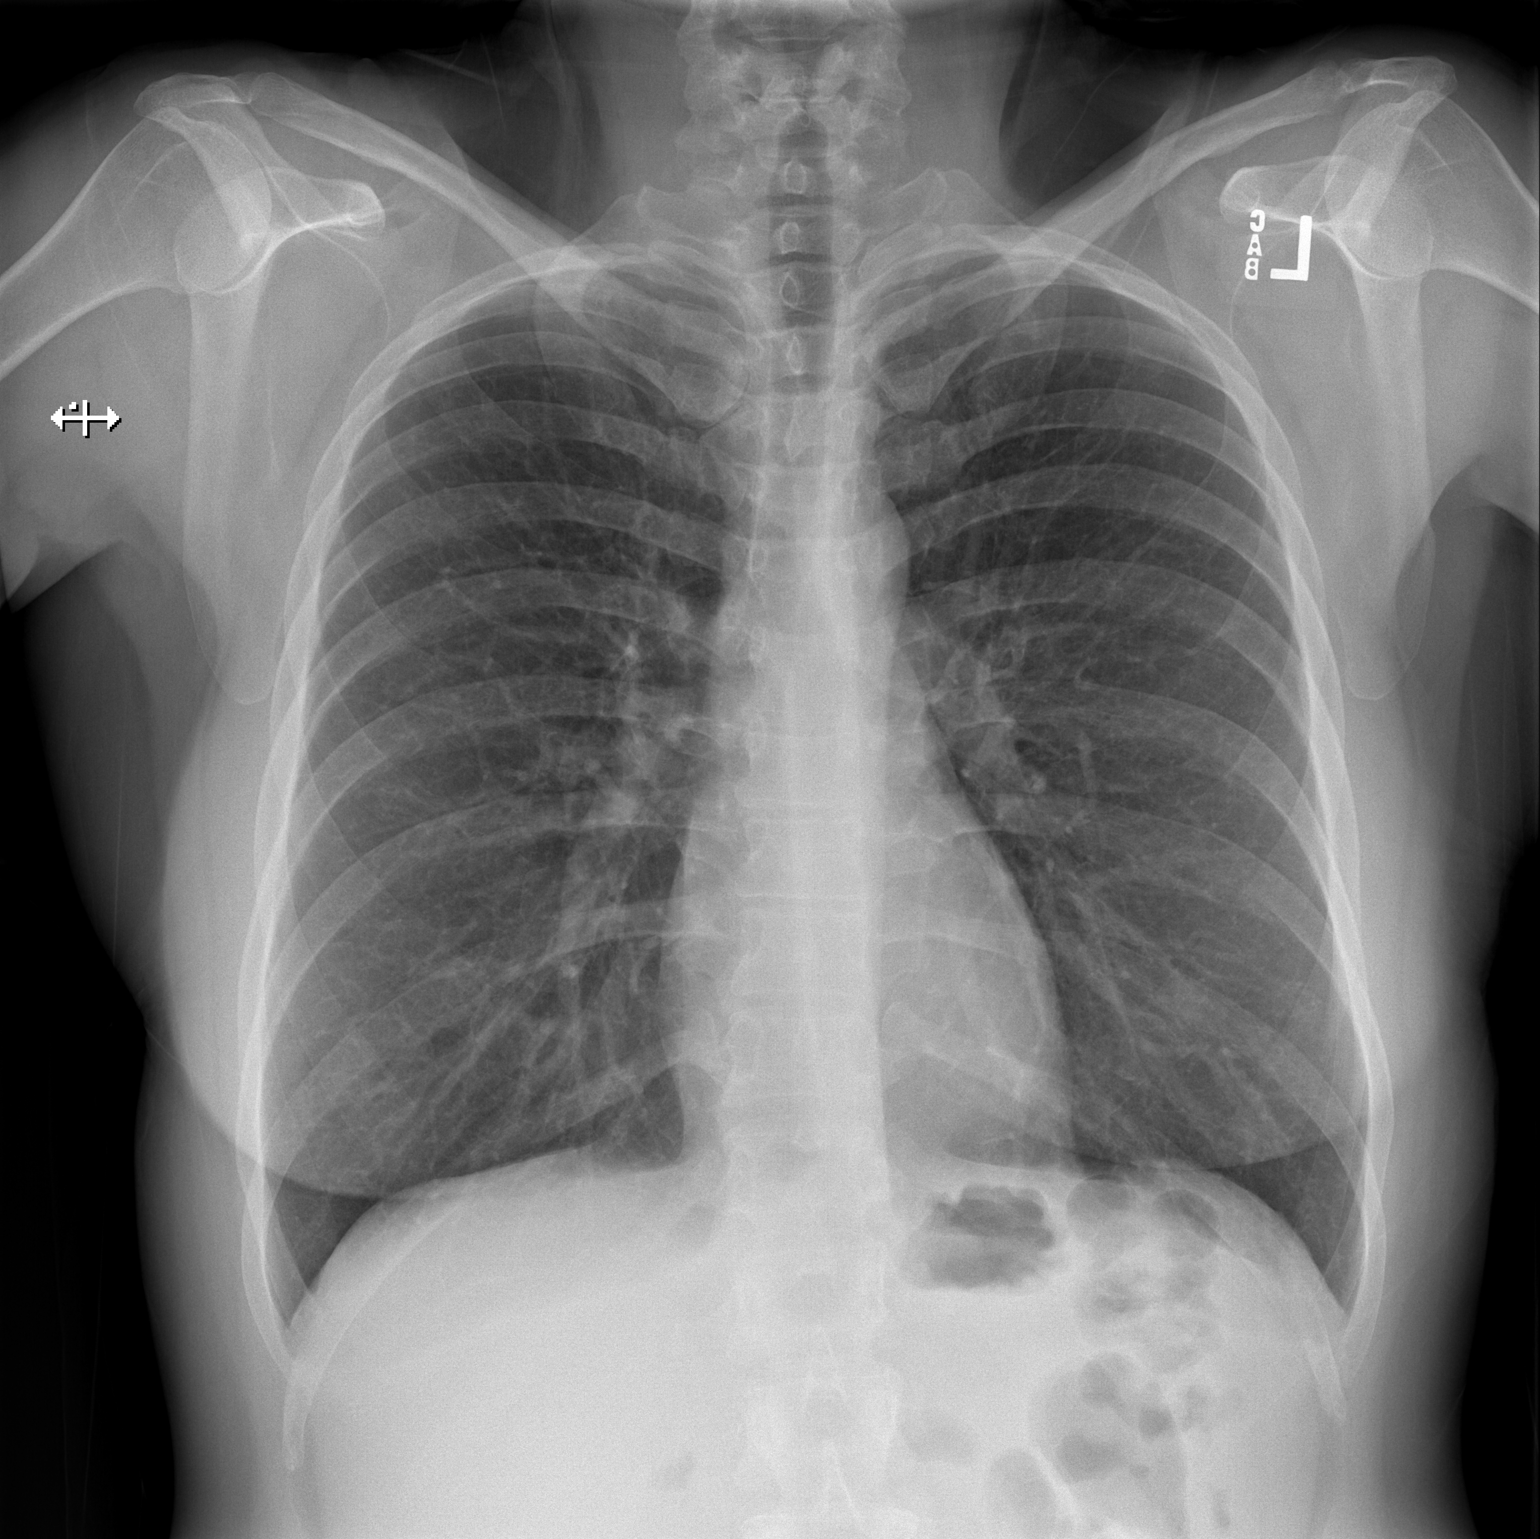
[im 2/2]
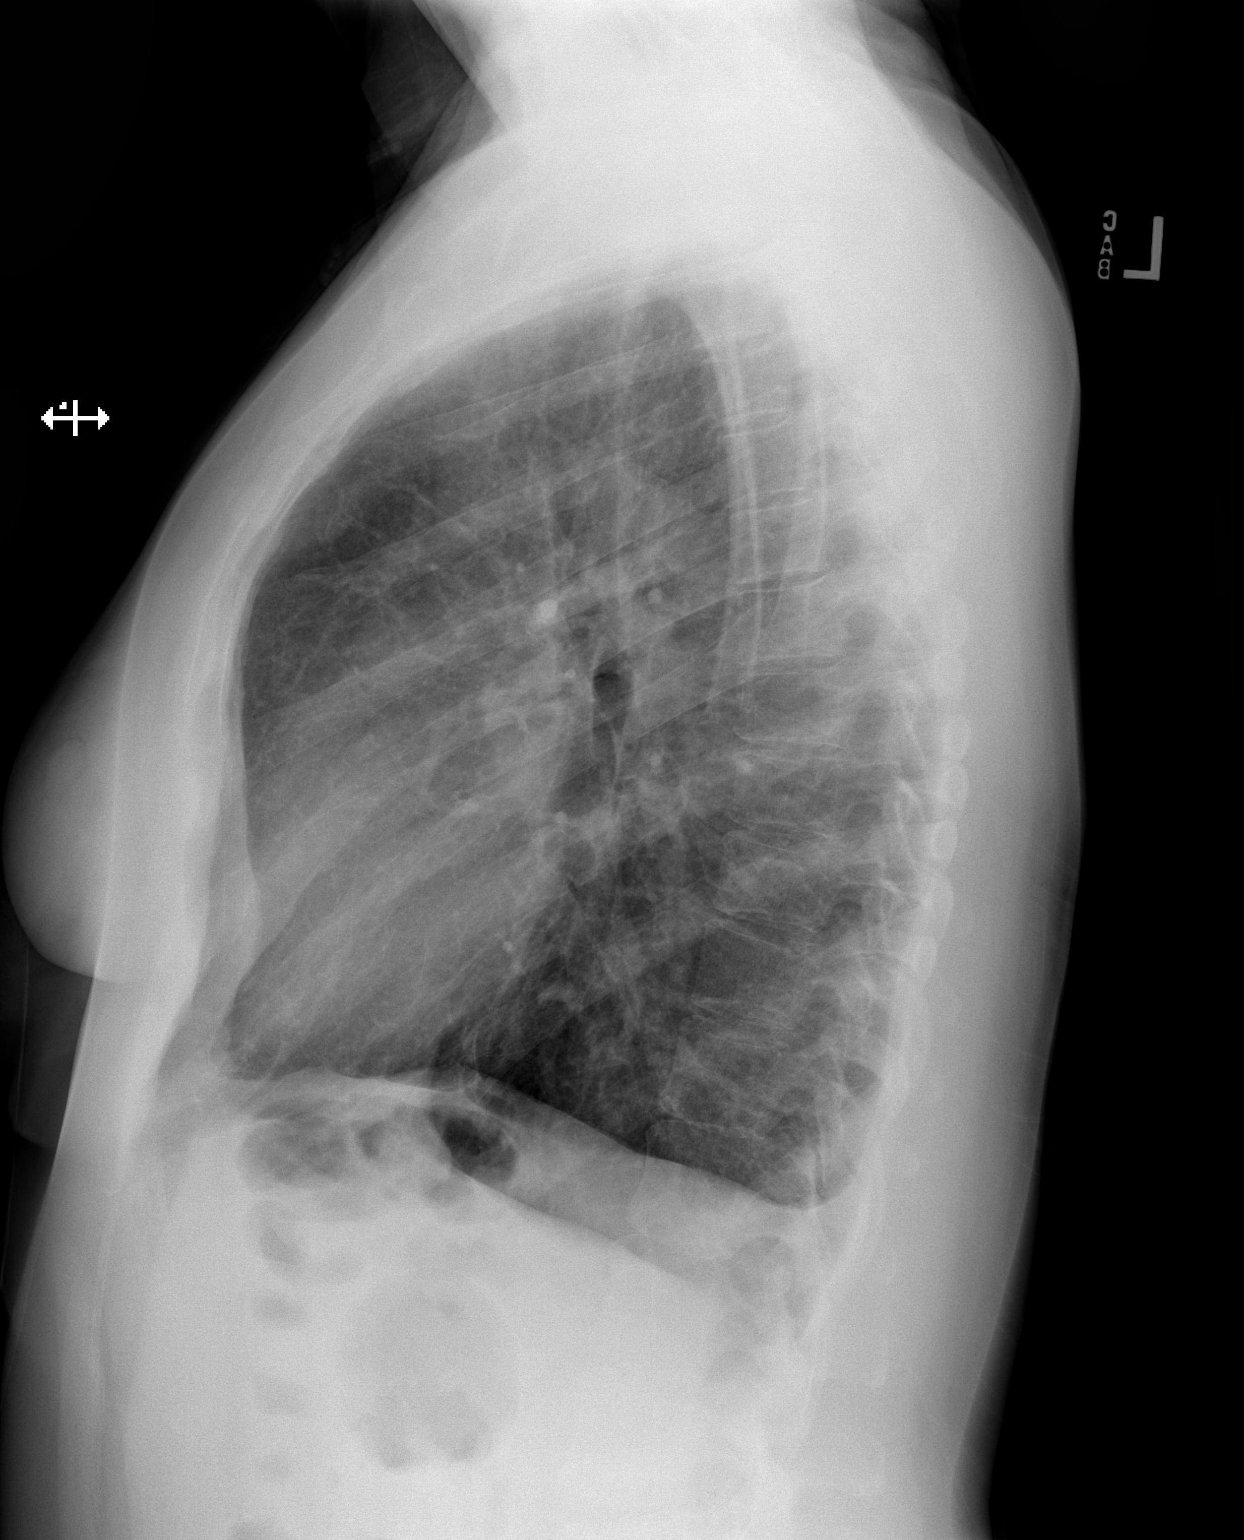

[2 of 2 positions shown; findings below may reference images not displayed]

FINDINGS: Cardiac shadow is within normal limits. The lungs are clear but
hyperinflated. No focal infiltrate or sizable effusion is noted. No
bony abnormality is noted.
IMPRESSION: Hyperinflation likely related to a vigorous inspiratory effort. No
acute abnormality is seen.

## 2020-12-27 ENCOUNTER — Ambulatory Visit: Payer: 59

## 2020-12-29 ENCOUNTER — Encounter: Payer: Self-pay | Admitting: Internal Medicine

## 2021-01-10 ENCOUNTER — Other Ambulatory Visit: Payer: Self-pay

## 2021-01-10 ENCOUNTER — Ambulatory Visit (INDEPENDENT_AMBULATORY_CARE_PROVIDER_SITE_OTHER): Payer: 59

## 2021-01-10 DIAGNOSIS — Z1329 Encounter for screening for other suspected endocrine disorder: Secondary | ICD-10-CM

## 2021-01-10 DIAGNOSIS — Z0000 Encounter for general adult medical examination without abnormal findings: Secondary | ICD-10-CM

## 2021-01-10 DIAGNOSIS — Z23 Encounter for immunization: Secondary | ICD-10-CM | POA: Diagnosis not present

## 2021-01-10 DIAGNOSIS — Z1389 Encounter for screening for other disorder: Secondary | ICD-10-CM

## 2021-01-10 DIAGNOSIS — Z0184 Encounter for antibody response examination: Secondary | ICD-10-CM

## 2021-01-10 DIAGNOSIS — Z111 Encounter for screening for respiratory tuberculosis: Secondary | ICD-10-CM

## 2021-01-10 DIAGNOSIS — Z13220 Encounter for screening for lipoid disorders: Secondary | ICD-10-CM

## 2021-01-10 NOTE — Progress Notes (Signed)
Pt presented today for a Hep A vaccine to left deltoid. See provider instructions in note from 11/25/20. Patient voiced no concerns nor showed any signs of distress during injection.

## 2021-01-11 LAB — COMPREHENSIVE METABOLIC PANEL
ALT: 11 IU/L (ref 0–32)
AST: 10 IU/L (ref 0–40)
Albumin/Globulin Ratio: 1.9 (ref 1.2–2.2)
Albumin: 4.5 g/dL (ref 3.8–4.8)
Alkaline Phosphatase: 49 IU/L (ref 44–121)
BUN/Creatinine Ratio: 11 (ref 9–23)
BUN: 9 mg/dL (ref 6–24)
Bilirubin Total: 0.3 mg/dL (ref 0.0–1.2)
CO2: 20 mmol/L (ref 20–29)
Calcium: 9.5 mg/dL (ref 8.7–10.2)
Chloride: 101 mmol/L (ref 96–106)
Creatinine, Ser: 0.8 mg/dL (ref 0.57–1.00)
Globulin, Total: 2.4 g/dL (ref 1.5–4.5)
Glucose: 91 mg/dL (ref 65–99)
Potassium: 4.5 mmol/L (ref 3.5–5.2)
Sodium: 137 mmol/L (ref 134–144)
Total Protein: 6.9 g/dL (ref 6.0–8.5)
eGFR: 95 mL/min/{1.73_m2} (ref >59)

## 2021-01-11 LAB — CBC WITH DIFFERENTIAL/PLATELET
Basophils Absolute: 0.1 10*3/uL (ref 0.0–0.2)
Basos: 1 %
EOS (ABSOLUTE): 0.1 10*3/uL (ref 0.0–0.4)
Eos: 2 %
Hematocrit: 38.9 % (ref 34.0–46.6)
Hemoglobin: 13.3 g/dL (ref 11.1–15.9)
Immature Grans (Abs): 0 10*3/uL (ref 0.0–0.1)
Immature Granulocytes: 0 %
Lymphocytes Absolute: 2.5 10*3/uL (ref 0.7–3.1)
Lymphs: 37 %
MCH: 30.6 pg (ref 26.6–33.0)
MCHC: 34.2 g/dL (ref 31.5–35.7)
MCV: 89 fL (ref 79–97)
Monocytes Absolute: 0.6 10*3/uL (ref 0.1–0.9)
Monocytes: 9 %
Neutrophils Absolute: 3.5 10*3/uL (ref 1.4–7.0)
Neutrophils: 51 %
Platelets: 180 10*3/uL (ref 150–450)
RBC: 4.35 x10E6/uL (ref 3.77–5.28)
RDW: 12.4 % (ref 11.7–15.4)
WBC: 6.8 10*3/uL (ref 3.4–10.8)

## 2021-01-11 LAB — URINALYSIS, ROUTINE W REFLEX MICROSCOPIC
Bilirubin, UA: NEGATIVE
Glucose, UA: NEGATIVE
Ketones, UA: NEGATIVE
Nitrite, UA: NEGATIVE
Protein,UA: NEGATIVE
RBC, UA: NEGATIVE
Specific Gravity, UA: <=1.005 — AB (ref 1.005–1.030)
Urobilinogen, Ur: 0.2 mg/dL (ref 0.2–1.0)
pH, UA: 7 (ref 5.0–7.5)

## 2021-01-11 LAB — TSH: TSH: 1.33 u[IU]/mL (ref 0.450–4.500)

## 2021-01-11 LAB — MICROSCOPIC EXAMINATION
Casts: NONE SEEN /lpf
RBC, Urine: NONE SEEN /hpf (ref 0–2)

## 2021-01-11 LAB — MEASLES/MUMPS/RUBELLA IMMUNITY
MUMPS ABS, IGG: <9 AU/mL — ABNORMAL LOW (ref >10.9)
RUBEOLA AB, IGG: 69.1 AU/mL (ref >16.4)
Rubella Antibodies, IGG: 4.98 index (ref >0.99)

## 2021-01-11 LAB — VARICELLA ZOSTER ANTIBODY, IGG: Varicella zoster IgG: 470 index (ref >165)

## 2021-01-11 LAB — LIPID PANEL
Chol/HDL Ratio: 3.5 ratio (ref 0.0–4.4)
Cholesterol, Total: 230 mg/dL — ABNORMAL HIGH (ref 100–199)
HDL: 66 mg/dL (ref >39)
LDL Chol Calc (NIH): 146 mg/dL — ABNORMAL HIGH (ref 0–99)
Triglycerides: 105 mg/dL (ref 0–149)
VLDL Cholesterol Cal: 18 mg/dL (ref 5–40)

## 2021-01-12 LAB — QUANTIFERON-TB GOLD PLUS
Mitogen-NIL: >10 IU/mL
NIL: 0.05 IU/mL
QuantiFERON-TB Gold Plus: NEGATIVE
TB1-NIL: 0 IU/mL
TB2-NIL: 0 IU/mL

## 2021-01-17 NOTE — Progress Notes (Signed)
Patient has seen results on mychart. Mychart message sent to respond with any further questions.

## 2021-01-23 ENCOUNTER — Encounter: Payer: Self-pay | Admitting: Internal Medicine

## 2021-02-01 ENCOUNTER — Telehealth: Payer: Self-pay | Admitting: Internal Medicine

## 2021-02-01 NOTE — Telephone Encounter (Signed)
Rejection Reason - Other - patient will call to reschedule" Particia Jasper said on Feb 01, 2021 2:23 PM  "Elma called patient to reschedule - patient will call back to reschedule" Particia Jasper said on Feb 01, 2021 2:23 PM  Msg from [facility] ob/gyn

## 2021-02-07 NOTE — Telephone Encounter (Signed)
Please advise 

## 2021-02-09 ENCOUNTER — Ambulatory Visit: Payer: 59 | Admitting: Internal Medicine

## 2021-02-09 ENCOUNTER — Encounter: Payer: Self-pay | Admitting: Internal Medicine

## 2021-02-09 VITALS — BP 108/74 | HR 103 | Temp 98.1°F | Ht 67.0 in | Wt 175.2 lb

## 2021-02-09 DIAGNOSIS — E663 Overweight: Secondary | ICD-10-CM

## 2021-02-09 DIAGNOSIS — B379 Candidiasis, unspecified: Secondary | ICD-10-CM | POA: Diagnosis not present

## 2021-02-09 MED ORDER — SEMAGLUTIDE (1 MG/DOSE) 4 MG/3ML ~~LOC~~ SOPN: 1.0000 mg | PEN_INJECTOR | SUBCUTANEOUS | 0 refills | Status: DC

## 2021-02-09 MED ORDER — OZEMPIC (0.25 OR 0.5 MG/DOSE) 2 MG/1.5ML ~~LOC~~ SOPN: 0.2500 mg | PEN_INJECTOR | SUBCUTANEOUS | 0 refills | Status: DC

## 2021-02-09 MED ORDER — FLUCONAZOLE 150 MG PO TABS: 150.0000 mg | ORAL_TABLET | Freq: Once | ORAL | 0 refills | Status: AC

## 2021-02-09 MED ORDER — WEGOVY 1.7 MG/0.75ML ~~LOC~~ SOAJ: 1.7000 mg | SUBCUTANEOUS | 0 refills | Status: DC

## 2021-02-09 MED ORDER — WEGOVY 2.4 MG/0.75ML ~~LOC~~ SOAJ: 2.4000 mg | SUBCUTANEOUS | 2 refills | Status: DC

## 2021-02-09 NOTE — Progress Notes (Signed)
Chief Complaint  Patient presents with   Weight Loss   F/u  Overweight did not like adipex made her feel and kept stopping gaining wt and restarting but did not like it appetite did not suppress on this  Wants to try wegovy Trying exercise and healthy diet choices and measuring food on scale   Rec MMR vaccine  Review of Systems  Constitutional:  Negative for weight loss.  HENT:  Negative for hearing loss.   Eyes:  Negative for blurred vision.  Respiratory:  Negative for shortness of breath.   Cardiovascular:  Negative for chest pain.  Skin:  Negative for rash.  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[facility])    etoh, heroin, cocaine, opiates   Tendonitis    L arm    UTI (urinary tract infection)    Past Surgical History:  Procedure Laterality Date   LEEP     2001   TUBAL LIGATION     Family History  Problem Relation Age of Onset   Cancer Mother        skin    Alcohol abuse Father    Depression Father    Thyroid disease Father    Stroke Father    Obesity Father    Cancer Maternal Grandmother        breast   Cancer Paternal Grandmother        breast    Social History   Socioeconomic History   Marital status: Single    Spouse name: Not on file   Number of children: Not on file   Years of education: Not on file   Highest education level: Not on file  Occupational History   Not on file  Tobacco Use   Smoking status: Former    Packs/day: 1.00    Pack years: 0.00    Types: Cigarettes    Quit date: 11/05/2019    Years since quitting: 1.2   Smokeless tobacco: Never  Substance and Sexual Activity   Alcohol use: Yes    Comment: twisted tea and a few beers everyday   Drug use: Yes    Types: Cocaine, Marijuana    Comment: today   Sexual activity: Not on file  Other Topics Concern   Not on file  Social History Narrative   4 kids (2 sons 29 y.o lives  with her and 80 y.o lives elsewhere)    12 grade ed works for [employer]    H/o etoh and substance abuse clean x 11 months as of 08/28/2018   No guns    Wears seat belt, safe in relationship    As of 11/25/20 going to school BS social work   Social Determinants of Radio broadcast assistant Strain: Not on file  Food Insecurity: Not on file  Transportation Needs: Not on file  Physical Activity: Not on file  Stress: Not on file  Social Connections: Not on file  Intimate Partner Violence: Not on file   Current Meds  Medication Sig   buPROPion (WELLBUTRIN XL) 300 MG 24 hr tablet Take 1 tablet (300 mg total) by mouth daily.   fluconazole (DIFLUCAN) 150 MG tablet Take 1 tablet (150 mg total) by mouth once for 1 dose. Repeat dose in 3 days if needed   gabapentin (NEURONTIN) 400 MG capsule Take 1  capsule (400 mg total) by mouth 3 (three) times daily.   lamoTRIgine (LAMICTAL) 100 MG tablet Take 1 tablet (100 mg total) by mouth daily.   Semaglutide, 1 MG/DOSE, 4 MG/3ML SOPN Inject 1 mg as directed once a week. Week 9-12 then increase wegovy 1.7 weekly week 13-16 and week 17 2.4 weekly   Semaglutide,0.25 or 0.5MG/DOS, (OZEMPIC, 0.25 OR 0.5 MG/DOSE,) 2 MG/1.5ML SOPN Inject 0.25-0.5 mg into the skin once a week. 0.25 weekly x 1-4 weeks, 0.5 weekly weeks 5-8   Semaglutide-Weight Management (WEGOVY) 1.7 MG/0.75ML SOAJ Inject 1.7 mg into the skin once a week.   Semaglutide-Weight Management (WEGOVY) 2.4 MG/0.75ML SOAJ Inject 2.4 mg into the skin once a week. Week 17 and beyond   traZODone (DESYREL) 100 MG tablet Take 1 tablet (100 mg total) by mouth at bedtime as needed for sleep.   Allergies  Allergen Reactions   Chantix [Varenicline Tartrate]     Chest pressure and bugs crawling    Latex Itching    gloves   Recent Results (from the past 2160 hour(s))  Varicella zoster antibody, IgG     Status: None   Collection Time: 01/10/21  8:32 AM  Result Value Ref Range   Varicella zoster IgG 470  Immune >165 index    Comment:                                Negative          <135                                Equivocal    135 - 165                                Positive          >165 A positive result generally indicates exposure to the pathogen or administration of specific immunoglobulins, but it is not indication of active infection or stage of disease.   Measles/Mumps/Rubella Immunity     Status: Abnormal   Collection Time: 01/10/21  8:32 AM  Result Value Ref Range   Rubella Antibodies, IGG 4.98 Immune >0.99 index    Comment:                                 Non-immune       <0.90                                 Equivocal  0.90 - 0.99                                 Immune           >0.99    RUBEOLA AB, IGG 69.1 Immune >16.4 AU/mL    Comment:                                  Negative        <13.5  Equivocal 13.5 - 16.4                                  Positive        >16.4 Presence of antibodies to Rubeola is presumptive evidence of immunity except when acute infection is suspected.    MUMPS ABS, IGG <9.0 (L) Immune >10.9 AU/mL    Comment:                                 Negative         <9.0                                 Equivocal  9.0 - 10.9                                 Positive        >10.9 A positive result generally indicates past exposure to Mumps virus or previous vaccination.   TSH     Status: None   Collection Time: 01/10/21  8:32 AM  Result Value Ref Range   TSH 1.330 0.450 - 4.500 uIU/mL  CBC with Differential/Platelet     Status: None   Collection Time: 01/10/21  8:32 AM  Result Value Ref Range   WBC 6.8 3.4 - 10.8 x10E3/uL   RBC 4.35 3.77 - 5.28 x10E6/uL   Hemoglobin 13.3 11.1 - 15.9 g/dL   Hematocrit 38.9 34.0 - 46.6 %   MCV 89 79 - 97 fL   MCH 30.6 26.6 - 33.0 pg   MCHC 34.2 31.5 - 35.7 g/dL   RDW 12.4 11.7 - 15.4 %   Platelets 180 150 - 450 x10E3/uL   Neutrophils 51 Not Estab. %   Lymphs 37 Not Estab. %    Monocytes 9 Not Estab. %   Eos 2 Not Estab. %   Basos 1 Not Estab. %   Neutrophils Absolute 3.5 1.4 - 7.0 x10E3/uL   Lymphocytes Absolute 2.5 0.7 - 3.1 x10E3/uL   Monocytes Absolute 0.6 0.1 - 0.9 x10E3/uL   EOS (ABSOLUTE) 0.1 0.0 - 0.4 x10E3/uL   Basophils Absolute 0.1 0.0 - 0.2 x10E3/uL   Immature Granulocytes 0 Not Estab. %   Immature Grans (Abs) 0.0 0.0 - 0.1 x10E3/uL  Lipid panel     Status: Abnormal   Collection Time: 01/10/21  8:32 AM  Result Value Ref Range   Cholesterol, Total 230 (H) 100 - 199 mg/dL   Triglycerides 105 0 - 149 mg/dL   HDL 66 >39 mg/dL   VLDL Cholesterol Cal 18 5 - 40 mg/dL   LDL Chol Calc (NIH) 146 (H) 0 - 99 mg/dL   Chol/HDL Ratio 3.5 0.0 - 4.4 ratio    Comment:                                   T. Chol/HDL Ratio                                             Men  Women  1/2 Avg.Risk  3.4    3.3                                   Avg.Risk  5.0    4.4                                2X Avg.Risk  9.6    7.1                                3X Avg.Risk 23.4   11.0   Comprehensive metabolic panel     Status: None   Collection Time: 01/10/21  8:32 AM  Result Value Ref Range   Glucose 91 65 - 99 mg/dL   BUN 9 6 - 24 mg/dL   Creatinine, Ser 0.80 0.57 - 1.00 mg/dL   eGFR 95 >59 mL/min/1.73   BUN/Creatinine Ratio 11 9 - 23   Sodium 137 134 - 144 mmol/L   Potassium 4.5 3.5 - 5.2 mmol/L   Chloride 101 96 - 106 mmol/L   CO2 20 20 - 29 mmol/L   Calcium 9.5 8.7 - 10.2 mg/dL   Total Protein 6.9 6.0 - 8.5 g/dL   Albumin 4.5 3.8 - 4.8 g/dL   Globulin, Total 2.4 1.5 - 4.5 g/dL   Albumin/Globulin Ratio 1.9 1.2 - 2.2   Bilirubin Total 0.3 0.0 - 1.2 mg/dL   Alkaline Phosphatase 49 44 - 121 IU/L   AST 10 0 - 40 IU/L   ALT 11 0 - 32 IU/L  Urinalysis, Routine w reflex microscopic     Status: Abnormal   Collection Time: 01/10/21  8:33 AM  Result Value Ref Range   Specific Gravity, UA      <=1.005 (A) 1.005 - 1.030   pH, UA 7.0 5.0 - 7.5    Color, UA Yellow Yellow   Appearance Ur Clear Clear   Leukocytes,UA 3+ (A) Negative   Protein,UA Negative Negative/Trace   Glucose, UA Negative Negative   Ketones, UA Negative Negative   RBC, UA Negative Negative   Bilirubin, UA Negative Negative   Urobilinogen, Ur 0.2 0.2 - 1.0 mg/dL   Nitrite, UA Negative Negative   Microscopic Examination See below:     Comment: Microscopic was indicated and was performed.  Microscopic Examination     Status: Abnormal   Collection Time: 01/10/21  8:33 AM   Urine  Result Value Ref Range   WBC, UA 11-30 (A) 0 - 5 /hpf   RBC None seen 0 - 2 /hpf   Epithelial Cells (non renal) 0-10 0 - 10 /hpf   Casts None seen None seen /lpf   Bacteria, UA Few None seen/Few  QuantiFERON-TB Gold Plus     Status: None   Collection Time: 01/10/21  8:47 AM  Result Value Ref Range   QuantiFERON-TB Gold Plus NEGATIVE NEGATIVE    Comment: Negative test result. M. tuberculosis complex  infection unlikely.    NIL 0.05 IU/mL   Mitogen-NIL >10.00 IU/mL   TB1-NIL 0.00 IU/mL   TB2-NIL 0.00 IU/mL    Comment: . The Nil tube value reflects the background interferon gamma immune response of the patient's blood sample. This value has been subtracted from the patient's displayed TB and Mitogen results. . Lower than expected results with the Mitogen  tube prevent false-negative Quantiferon readings by detecting a patient with a potential immune suppressive condition and/or suboptimal pre-analytical specimen handling. . The TB1 Antigen tube is coated with the M. tuberculosis-specific antigens designed to elicit responses from TB antigen primed CD4+ helper T-lymphocytes. . The TB2 Antigen tube is coated with the M. tuberculosis-specific antigens designed to elicit responses from TB antigen primed CD4+ helper and CD8+ cytotoxic T-lymphocytes. . For additional information, please refer to https://education.[facility].com/faq/FAQ204 (This link is being provided  for informational/ educational purposes only.) .    Objective  Body mass index is 27.44 kg/m. Wt Readings from Last 3 Encounters:  02/09/21 175 lb 3.2 oz (79.5 kg)  11/25/20 172 lb 9.6 oz (78.3 kg)  10/28/19 164 lb 9.6 oz (74.7 kg)   Temp Readings from Last 3 Encounters:  02/09/21 98.1 F (36.7 C) (Oral)  11/25/20 98.1 F (36.7 C) (Oral)  10/28/19 97.7 F (36.5 C) (Temporal)   BP Readings from Last 3 Encounters:  02/09/21 108/74  11/25/20 98/78  10/28/19 100/70   Pulse Readings from Last 3 Encounters:  02/09/21 (!) 103  11/25/20 86  10/28/19 84    Physical Exam Vitals and nursing note reviewed.  Constitutional:      Appearance: Normal appearance. She is well-developed and well-groomed.  HENT:     Head: Normocephalic and atraumatic.  Eyes:     Conjunctiva/sclera: Conjunctivae normal.     Pupils: Pupils are equal, round, and reactive to light.  Cardiovascular:     Rate and Rhythm: Normal rate and regular rhythm.     Heart sounds: Normal heart sounds. No murmur heard. Pulmonary:     Effort: Pulmonary effort is normal.     Breath sounds: Normal breath sounds.  Skin:    General: Skin is warm and moist.  Neurological:     General: No focal deficit present.     Mental Status: She is alert and oriented to person, place, and time. Mental status is at baseline.     Gait: Gait normal.  Psychiatric:        Attention and Perception: Attention and perception normal.        Mood and Affect: Mood and affect normal.        Speech: Speech normal.        Behavior: Behavior normal. Behavior is cooperative.        Thought Content: Thought content normal.        Cognition and Memory: Cognition and memory normal.        Judgment: Judgment normal.    Assessment  Plan  Overweight (BMI 25.0-29.9) - Plan: Semaglutide,0.25 or 0.5MG/DOS, (OZEMPIC, 0.25 OR 0.5 MG/DOSE,) 2 MG/1.5ML SOPN, Semaglutide-Weight Management (WEGOVY) 1.7 MG/0.75ML SOAJ, Semaglutide-Weight Management  (WEGOVY) 2.4 MG/0.75ML SOAJ, Semaglutide, 1 MG/DOSE, 4 MG/3ML SOPN Did not like adipex  Consider qysmia  Rec MMR vaccine Consider wt loss clinic but not sure if will see her due to bmi not 30  Cont healthy diet and exercise   Yeast infection - Plan: fluconazole (DIFLUCAN) 150 MG tablet   HM Had flu shot not had  covid vx had 2/2 consider booster had covid Hep A 2/2 Tdap utd Hep B immune  rec healthy diet and exercise Pap had 05/2018 +candida will call ob/gyn to schedule  mammo due 12/30/20 [facility] age 9 rec healthy diet and exercise    Provider: Dr. Olivia Mackie McLean-Scocuzza-Internal Medicine

## 2021-02-09 NOTE — Patient Instructions (Addendum)
Online nutrition The Next 56 days not covered by insurance   Call and schedule appt with ob/gyn  [phone] Not available [address]   [place] [address]     Call [facility] for [facility]ogram    Semaglutide injection solution What is this medication? SEMAGLUTIDE (Sem a GLOO tide) is used to improve blood sugar control in adults with type 2 diabetes. This medicine may be used with other diabetes medicines. This drug may also reduce the risk of heart attack or stroke if you have type 2diabetes and risk factors for heart disease. This medicine may be used for other purposes; ask your health care provider orpharmacist if you have questions. COMMON BRAND NAME(S): OZEMPIC What should I tell my care team before I take this medication? They need to know if you have any of these conditions: endocrine tumors (MEN 2) or if someone in your family had these tumors eye disease, vision problems history of pancreatitis kidney disease stomach problems thyroid cancer or if someone in your family had thyroid cancer an unusual or allergic reaction to semaglutide, other medicines, foods, dyes, or preservatives pregnant or trying to get pregnant breast-feeding How should I use this medication? This medicine is for injection under the skin of your upper leg (thigh), stomach area, or upper arm. It is given once every week (every 7 days). You will be taught how to prepare and give this medicine. Use exactly as directed. Take your medicine at regular intervals. Do not take it more often thandirected. If you use this medicine with insulin, you should inject this medicine and the insulin separately. Do not mix them together. Do not give the injections rightnext to each other. Change (rotate) injection sites with each injection. It is important that you put your used needles and syringes in a special sharps container. Do not put them in a trash can. If you do not have a sharpscontainer, call your  pharmacist or healthcare provider to get one. A special MedGuide will be given to you by the pharmacist with eachprescription and refill. Be sure to read this information carefully each time. This drug comes with INSTRUCTIONS FOR USE. Ask your pharmacist for directions on how to use this drug. Read the information carefully. Talk to yourpharmacist or health care provider if you have questions. Talk to your pediatrician regarding the use of this medicine in children.Special care may be needed. Overdosage: If you think you have taken too much of this medicine contact apoison control center or emergency room at once. NOTE: This medicine is only for you. Do not share this medicine with others. What if I miss a dose? If you miss a dose, take it as soon as you can within 5 days after the missed dose. Then take your next dose at your regular weekly time. If it has been longer than 5 days after the missed dose, do not take the missed dose. Take the next dose at your regular time. Do not take double or extra doses. If you havequestions about a missed dose, contact your health care provider for advice. What may interact with this medication? other medicines for diabetes Many medications may cause changes in blood sugar, these include: alcohol containing beverages antiviral medicines for HIV or AIDS aspirin and aspirin-like drugs certain medicines for blood pressure, heart disease, irregular heart beat chromium diuretics female hormones, such as estrogens or progestins, birth control pills fenofibrate gemfibrozil isoniazid lanreotide female hormones or anabolic steroids MAOIs like Carbex, Eldepryl, Marplan, Nardil, and  Parnate medicines for weight loss medicines for allergies, asthma, cold, or cough medicines for depression, anxiety, or psychotic disturbances niacin nicotine NSAIDs, medicines for pain and inflammation, like ibuprofen or  naproxen octreotide pasireotide pentamidine phenytoin probenecid quinolone antibiotics such as ciprofloxacin, levofloxacin, ofloxacin some herbal dietary supplements steroid medicines such as prednisone or cortisone sulfamethoxazole; trimethoprim thyroid hormones Some medications can hide the warning symptoms of low blood sugar (hypoglycemia). You may need to monitor your blood sugar more closely if youare taking one of these medications. These include: beta-blockers, often used for high blood pressure or heart problems (examples include atenolol, metoprolol, propranolol) clonidine guanethidine reserpine This list may not describe all possible interactions. Give your health care provider a list of all the medicines, herbs, non-prescription drugs, or dietary supplements you use. Also tell them if you smoke, drink alcohol, or use illegaldrugs. Some items may interact with your medicine. What should I watch for while using this medication? Visit your doctor or health care professional for regular checks on yourprogress. Drink plenty of fluids while taking this medicine. Check with your doctor or health care professional if you get an attack of severe diarrhea, nausea, and vomiting. The loss of too much body fluid can make it dangerous for you to takethis medicine. A test called the HbA1C (A1C) will be monitored. This is a simple blood test. It measures your blood sugar control over the last 2 to 3 months. You willreceive this test every 3 to 6 months. Learn how to check your blood sugar. Learn the symptoms of low and high bloodsugar and how to manage them. Always carry a quick-source of sugar with you in case you have symptoms of low blood sugar. Examples include hard sugar candy or glucose tablets. Make sure others know that you can choke if you eat or drink when you develop serious symptoms of low blood sugar, such as seizures or unconsciousness. They must getmedical help at once. Tell your  doctor or health care professional if you have high blood sugar. You might need to change the dose of your medicine. If you are sick or exercisingmore than usual, you might need to change the dose of your medicine. Do not skip meals. Ask your doctor or health care professional if you should avoid alcohol. Many nonprescription cough and cold products contain sugar oralcohol. These can affect blood sugar. Pens should never be shared. Even if the needle is changed, sharing may resultin passing of viruses like hepatitis or HIV. Wear a medical ID bracelet or chain, and carry a card that describes yourdisease and details of your medicine and dosage times. Do not become pregnant while taking this medicine. Women should inform their doctor if they wish to become pregnant or think they might be pregnant. There is a potential for serious side effects to an unborn child. Talk to your healthcare professional or pharmacist for more information. What side effects may I notice from receiving this medication? Side effects that you should report to your doctor or health care professionalas soon as possible: allergic reactions like skin rash, itching or hives, swelling of the face, lips, or tongue breathing problems changes in vision diarrhea that continues or is severe lump or swelling on the neck severe nausea signs and symptoms of infection like fever or chills; cough; sore throat; pain or trouble passing urine signs and symptoms of low blood sugar such as feeling anxious, confusion, dizziness, increased hunger, unusually weak or tired, sweating, shakiness, cold, irritable, headache, blurred vision, fast heartbeat, loss  of consciousness signs and symptoms of kidney injury like trouble passing urine or change in the amount of urine trouble swallowing unusual stomach upset or pain vomiting Side effects that usually do not require medical attention (report to yourdoctor or health care professional if they continue  or are bothersome): constipation diarrhea nausea pain, redness, or irritation at site where injected stomach upset This list may not describe all possible side effects. Call your doctor for medical advice about side effects. You may report side effects to FDA at1-800-FDA-1088. Where should I keep my medication? Keep out of the reach of children. Store unopened pens in a refrigerator between 2 and 8 degrees C (36 and 46 degrees F). Do not freeze. Protect from light and heat. After you first use the pen, it can be stored for 56 days at room temperature between 15 and 30 degrees C (59 and 86 degrees F) or in a refrigerator. Throw away your used pen after 56days or after the expiration date, whichever comes first. Do not store your pen with the needle attached. If the needle is left on,medicine may leak from the pen. NOTE: This sheet is a summary. It may not cover all possible information. If you have questions about this medicine, talk to your doctor, pharmacist, orhealth care provider.  2022 Elsevier/Gold Standard (2019-04-07 09:41:51)  Vaginal Yeast Infection, Adult  Vaginal yeast infection is a condition that causes vaginal discharge as well as soreness, swelling, and redness (inflammation) of the vagina. This is a common condition. Some women get this infectionfrequently. What are the causes? This condition is caused by a change in the normal balance of the yeast (candida) and bacteria that live in the vagina. This change causes an overgrowth ofyeast, which causes the inflammation. What increases the risk? The condition is more likely to develop in women who: Take antibiotic medicines. Have diabetes. Take birth control pills. Are pregnant. Douche often. Have a weak body defense system (immune system). Have been taking steroid medicines for a long time. Frequently wear tight clothing. What are the signs or symptoms? Symptoms of this condition include: White, thick, creamy vaginal  discharge. Swelling, itching, redness, and irritation of the vagina. The lips of the vagina (vulva) may be affected as well. Pain or a burning feeling while urinating. Pain during sex. How is this diagnosed? This condition is diagnosed based on: Your medical history. A physical exam. A pelvic exam. Your health care provider will examine a sample of your vaginal discharge under a microscope. Your health care provider may send this sample for testing to confirm the diagnosis. How is this treated? This condition is treated with medicine. Medicines may be over-the-counter or prescription. You may be told to use one or more of the following: Medicine that is taken by mouth (orally). Medicine that is applied as a cream (topically). Medicine that is inserted directly into the vagina (suppository). Follow these instructions at home:  Lifestyle Do not have sex until your health care provider approves. Tell your sex partner that you have a yeast infection. That person should go to his or her health care provider and ask if they should also be treated. Do not wear tight clothes, such as pantyhose or tight pants. Wear breathable cotton underwear. General instructions Take or apply over-the-counter and prescription medicines only as told by your health care provider. Eat more yogurt. This may help to keep your yeast infection from returning. Do not use tampons until your health care provider approves. Try taking a sitz bath  to help with discomfort. This is a warm water bath that is taken while you are sitting down. The water should only come up to your hips and should cover your buttocks. Do this 3-4 times per day or as told by your health care provider. Do not douche. If you have diabetes, keep your blood sugar levels under control. Keep all follow-up visits as told by your health care provider. This is important. Contact a health care provider if: You have a fever. Your symptoms go away and then  return. Your symptoms do not get better with treatment. Your symptoms get worse. You have new symptoms. You develop blisters in or around your vagina. You have blood coming from your vagina and it is not your menstrual period. You develop pain in your abdomen. Summary Vaginal yeast infection is a condition that causes discharge as well as soreness, swelling, and redness (inflammation) of the vagina. This condition is treated with medicine. Medicines may be over-the-counter or prescription. Take or apply over-the-counter and prescription medicines only as told by your health care provider. Do not douche. Do not have sex or use tampons until your health care provider approves. Contact a health care provider if your symptoms do not get better with treatment or your symptoms go away and then return. This information is not intended to replace advice given to you by your health care provider. Make sure you discuss any questions you have with your healthcare provider. Document Revised: 07/13/2020 Document Reviewed: 12/09/2017 Elsevier Patient Education  2022 Elsevier Inc.    Semaglutide Injection ([employer]) What is this medication? SEMAGLUTIDE (Sem a GLOO tide) is used to help people lose weight and maintainweight loss. It is used with a reduced-calorie diet and exercise. This medicine may be used for other purposes; ask your health care provider orpharmacist if you have questions. COMMON BRAND NAME(S): FAOZHY What should I tell my care team before I take this medication? They need to know if you have any of these conditions: endocrine tumors (MEN 2) or if someone in your family had these tumors eye disease, vision problems gallbladder disease history of depression or mental health disease history of pancreatitis kidney disease stomach or intestine problems suicidal thoughts, plans, or attempt; a previous suicide attempt by you or a family member thyroid cancer or if someone in  your family had thyroid cancer an unusual or allergic reaction to semaglutide, other medicines, foods, dyes, or preservatives pregnant or trying to get pregnant breast-feeding How should I use this medication? This medicine is injected under the skin. You will be taught how to prepare and give it. Take it as directed on the prescription label. It is given once every week (every 7 days). Keep taking it unless your health care provider tells youto stop. It is important that you put your used needles and pens in a special sharps container. Do not put them in a trash can. If you do not have a sharpscontainer, call your pharmacist or health care provider to get one. A special MedGuide will be given to you by the pharmacist with eachprescription and refill. Be sure to read this information carefully each time. This medicine comes with INSTRUCTIONS FOR USE. Ask your pharmacist for directions on how to use this medicine. Read the information carefully. Talk toyour pharmacist or health care provider if you have questions. Talk to your health care provider about the use of this medicine in children.Special care may be needed. Overdosage: If you think you have taken too  much of this medicine contact apoison control center or emergency room at once. NOTE: This medicine is only for you. Do not share this medicine with others. What if I miss a dose? If you miss a dose and the next scheduled dose is more than 2 days away, take the missed dose as soon as possible. If you miss a dose and the next scheduled dose is less than 2 days away, do not take the missed dose. Take the next dose at your regular time. Do not take double or extra doses. If you miss your dose for 2 weeks or more, take the next dose at your regular time or call yourhealth care provider to talk about how to restart this medicine. What may interact with this medication? insulin and other medicines for diabetes This list may not describe all possible  interactions. Give your health care provider a list of all the medicines, herbs, non-prescription drugs, or dietary supplements you use. Also tell them if you smoke, drink alcohol, or use illegaldrugs. Some items may interact with your medicine. What should I watch for while using this medication? Visit your health care provider for regular checks on your progress. It may besome time before you see the benefit from this medicine. Drink plenty of fluids while taking this medicine. Check with your health care provider if you have severe diarrhea, nausea, and vomiting, or if you sweat a lot. The loss of too much body fluid may make it dangerous for you to take thismedicine. This medicine may affect blood sugar levels. Ask your health care provider ifchanges in diet or medicines are needed if you have diabetes. If you or your family notice any changes in your behavior, such as new or worsening depression, thoughts of harming yourself, anxiety, other unusual ordisturbing thoughts, or memory loss, call your health care provider right away. Women should inform their health care provider if they wish to become pregnant or think they might be pregnant. Losing weight while pregnant is not advised and may cause harm to the unborn child. Talk to your health care provider formore information. What side effects may I notice from receiving this medication? Side effects that you should report to your doctor or health care professionalas soon as possible: allergic reactions like skin rash, itching or hives, swelling of the face, lips, or tongue changes in vision fast heartbeat gallbladder problems (fever, upper belly pain, yellowing of the eyes or skin, clay-colored stool) kidney injury (trouble passing urine or change in the amount of urine) low blood sugar (feeling anxious; confusion; dizziness; increased hunger; unusually weak or tired; increased sweating; shakiness; cold, clammy skin; irritable; headache; blurred  vision; loss of consciousness) lump or swelling on the neck pancreatitis (stomach pain that spreads to your back or gets worse after eating or when touched, fever, nausea, vomiting) suicidal thoughts, mood changes trouble breathing trouble swallowing Side effects that usually do not require medical attention (report these toyour doctor or health care professional if they continue or are bothersome): constipation diarrhea headache heartburn (burning feeling in chest, often after eating or when lying down) nausea pain, redness, or irritation at site where injected passing gas upset stomach vomiting This list may not describe all possible side effects. Call your doctor for medical advice about side effects. You may report side effects to FDA at1-800-FDA-1088. Where should I keep my medication? Keep out of the reach of children and pets. Refrigeration (preferred): Store in the refrigerator. Do not freeze. Keep this medicine in the original  container until you are ready to take it. Get rid ofany unused medicine after the expiration date. Room temperature: If needed, prior to cap removal, the pen can be stored at room temperature for up to 28 days. Protect from light. If it is stored at room temperature, get rid of any unused medicine after 28 days or after it expires,whichever is first. It is important to get rid of the medicine as soon as you no longer need it orit is expired. You can do this in two ways: Take the medicine to a medicine take-back program. Check with your pharmacy or law enforcement to find a location. If you cannot return the medicine, follow the directions in the MedGuide. NOTE: This sheet is a summary. It may not cover all possible information. If you have questions about this medicine, talk to your doctor, pharmacist, orhealth care provider.  2022 Elsevier/Gold Standard (2020-01-12 11:38:13)  Phentermine; Topiramate extended-release capsules What is this  medication? Phentermine; Topiramate (FEN ter meen; Toe PYRE a mate) is a combination of two drugs that decrease appetite and help you lose weight. This product is used with a reduced calorie diet and exercise. This product can also help youmaintain weight loss. This medicine may be used for other purposes; ask your health care provider orpharmacist if you have questions. COMMON BRAND NAME(S): Qsymia What should I tell my care team before I take this medication? They need to know if you have any of these conditions: bone problems depression or other mental illness diabetes diarrhea glaucoma having surgery heart disease high blood pressure history of drug abuse or alcohol abuse problem history of heart attack or stroke history of irregular heartbeat kidney disease or stones liver disease low levels of potassium in the blood lung or breathing disease, like asthma metabolic acidosis on a ketogenic diet seizures suicidal thoughts, plans, or attempt; a previous suicide attempt by you or a family member taken an MAOI like Carbex, Eldepryl, Marplan, Nardil, or Parnate in last 14 days thyroid disease an unusual or allergic reaction to phentermine, topiramate, other medicines, foods, dyes, or preservatives pregnant or trying to get pregnant breast-feeding How should I use this medication? Take this medicine by mouth with a glass of water. Follow the directions on the prescription label. Do not crush or chew. This medicine is usually taken with or without food once per day in the morning. Avoid taking this medicine in the evening. It may interfere with sleep. Take your doses at regular intervals. Donot take your medicine more often than directed. A special MedGuide will be given to you by the pharmacist with eachprescription and refill. Be sure to read this information carefully each time. Talk to your pediatrician regarding the use of this medicine in children.Special care may be  needed. Overdosage: If you think you have taken too much of this medicine contact apoison control center or emergency room at once. NOTE: This medicine is only for you. Do not share this medicine with others. What if I miss a dose? If you miss a dose, skip it. Take your next dose at the normal time. Do nottake extra or 2 doses at the same time to make up for the missed dose. What may interact with this medication? Do not take this medicine with any of the following medications: MAOIs like Carbex, Eldepryl, Marplan, Nardil, and Parnate This medicine may also interact with the following medications: acetazolamide alcohol antihistamines for allergy, cough and cold atropine birth control pills carbamazepine certain medicines for bladder problems  like oxybutynin, tolterodine certain medicines for depression, anxiety, or psychotic disturbances certain medicines for high blood pressure certain medicines for Parkinson's disease like benztropine, trihexyphenidyl certain medicines for sleep certain medicines for stomach problems like dicyclomine, hyoscyamine certain medicines for travel sickness like scopolamine dichlorphenamide digoxin diuretics linezolid medicines for colds or breathing difficulties like pseudoephedrine or phenylephrine medicines for diabetes methazolamide narcotic medicines for pain phenytoin sibutramine stimulant medicines for attention disorders, weight loss, or to stay awake valproic acid zonisamide This list may not describe all possible interactions. Give your health care provider a list of all the medicines, herbs, non-prescription drugs, or dietary supplements you use. Also tell them if you smoke, drink alcohol, or use illegaldrugs. Some items may interact with your medicine. What should I watch for while using this medication? Visit your doctor or health care provider for regular checks on your progress. Do not stop taking except on your health care provider's  advice. You may develop a severe reaction. Your health care provider will tell you how muchmedicine to take. Do not take this medicine close to bedtime. It may prevent you from sleeping. Avoid extreme heat. This medicine can cause you to sweat less than normal. Your body temperature could increase to dangerous levels, which may lead to heatstroke. You should drink plenty of fluids while taking this medicine. If you have had kidney stones in the past, this will help to reduce your chances of formingkidney stones. You may get drowsy or dizzy. Do not drive, use machinery, or do anything that needs mental alertness until you know how this medicine affects you. Do not stand or sit up quickly, especially if you are an older patient. This reduces the risk of dizzy or fainting spells. Alcohol may increase dizziness anddrowsiness. Avoid alcoholic drinks. This medicine may affect blood sugar levels. Ask your healthcare provider ifchanges in diet or medicines are needed if you have diabetes. Check with your health care professional if you have severe diarrhea, nausea, and vomiting, or if you sweat a lot. The loss of too much body fluid may makeit dangerous for you to take this medicine. Tell your health care professional right away if you have any change in youreyesight. Patients and their families should watch out for new or worsening depression or thoughts of suicide. Also watch out for sudden changes in feelings such as feeling anxious, agitated, panicky, irritable, hostile, aggressive, impulsive, severely restless, overly excited and hyperactive, or not being able to sleep. If this happens, especially at the beginning of treatment or after a change indose, call your healthcare professional. This medicine may cause serious skin reactions. They can happen weeks to months after starting the medicine. Contact your health care provider right away if you notice fevers or flu-like symptoms with a rash. The rash may be  red or purple and then turn into blisters or peeling of the skin. Or, you might notice a red rash with swelling of the face, lips or lymph nodes in your neck or underyour arms. Birth control may not work properly while you are taking this medicine. Talk toyour health care professional about using an extra method of birth control. Women should inform their health care provider if they wish to become pregnant or think they might be pregnant. There is a potential for serious side effects and harm to an unborn child. Losing weight while pregnant is not advised and may cause harm to the unborn child. Talk to your health care provider for moreinformation. What side effects may I  notice from receiving this medication? Side effects that you should report to your doctor or health care professionalas soon as possible: allergic reactions like skin rash, itching or hives, swelling of the face, lips, or tongue anxious breathing problems changes in vision chest pain or chest tightness depressed mood or other mood changes fast, irregular heartbeat increased blood pressure irritable pain, tingling, numbness in the hands or feet redness, blistering, peeling or loosening of the skin, including inside the mouth restlessness seizures signs and symptoms of increased acid in the body like breathing fast; fast heartbeat; headache; confusion; unusually weak or tired; nausea, vomiting signs and symptoms of kidney stones like blood in urine; pain in the lower back or side; pain when urinating signs and symptoms of a stroke like changes in vision; confusion; trouble speaking or understanding; severe headaches; sudden numbness or weakness of the face, arm or leg; trouble walking; dizziness; loss of balance or coordination suicidal thoughts trouble passing urine or change in the amount of urine Side effects that usually do not require medical attention (report to yourdoctor or health care professional if they continue or  are bothersome): changes in taste constipation dizziness dry mouth headache trouble sleeping This list may not describe all possible side effects. Call your doctor for medical advice about side effects. You may report side effects to FDA at1-800-FDA-1088. Where should I keep my medication? Keep out of the reach of children. This medicine can be abused. Keep your medicine in a safe place to protect it from theft. Do not share this medicine with anyone. Selling or giving away this medicine is dangerous and against thelaw. This medicine may cause harm and death if it is taken by other adults, children, or pets. Return medicine that has not been used to an official disposal site. Contact the DEA at 214-364-3927 or your city/county government to find a site. If you cannot return the medicine, mix any unused medicine with a substance like cat litter or coffee grounds. Then throw the medicine away in a sealed container like a sealed bag or coffee can with a lid. Do not use themedicine after the expiration date. Store at room temperature between 15 and 25 degrees C (59 and 77 degrees F). NOTE: This sheet is a summary. It may not cover all possible information. If you have questions about this medicine, talk to your doctor, pharmacist, orhealth care provider.  2022 Elsevier/Gold Standard (2019-05-29 12:44:21)

## 2021-02-22 ENCOUNTER — Telehealth: Payer: Self-pay | Admitting: Internal Medicine

## 2021-02-22 NOTE — Telephone Encounter (Signed)
Attempting to put in PA for [facility] on CoverMyMeds. Pa was declined stating that [employer] does not review this medication PA and to call the number on the back of the patient's insurance card

## 2021-03-31 ENCOUNTER — Encounter: Payer: Self-pay | Admitting: Internal Medicine

## 2021-03-31 ENCOUNTER — Telehealth: Payer: Self-pay

## 2021-03-31 NOTE — Telephone Encounter (Signed)
Confirmed fax for [facility] Prior Authorization form to [employer]. Sent to scan

## 2021-04-03 NOTE — Telephone Encounter (Signed)
Called and spoke to Magdelena. Verdella states that she is currently in training for a new job and will call back to schedule with a different provider to discuss weight loss.

## 2021-04-07 IMAGING — CR LUMBAR SPINE - COMPLETE 4+ VIEW
1 series · 5 of 5 positions shown · non-contrast
Comparison: None.

CLINICAL DATA: Lumbago

EXAM:
LUMBAR SPINE - COMPLETE 4+ VIEW

[Series 1: dg lumbar spine complete 4 +v · 0.14mm/px · 5 of 5 slices shown]
[im 1/5]
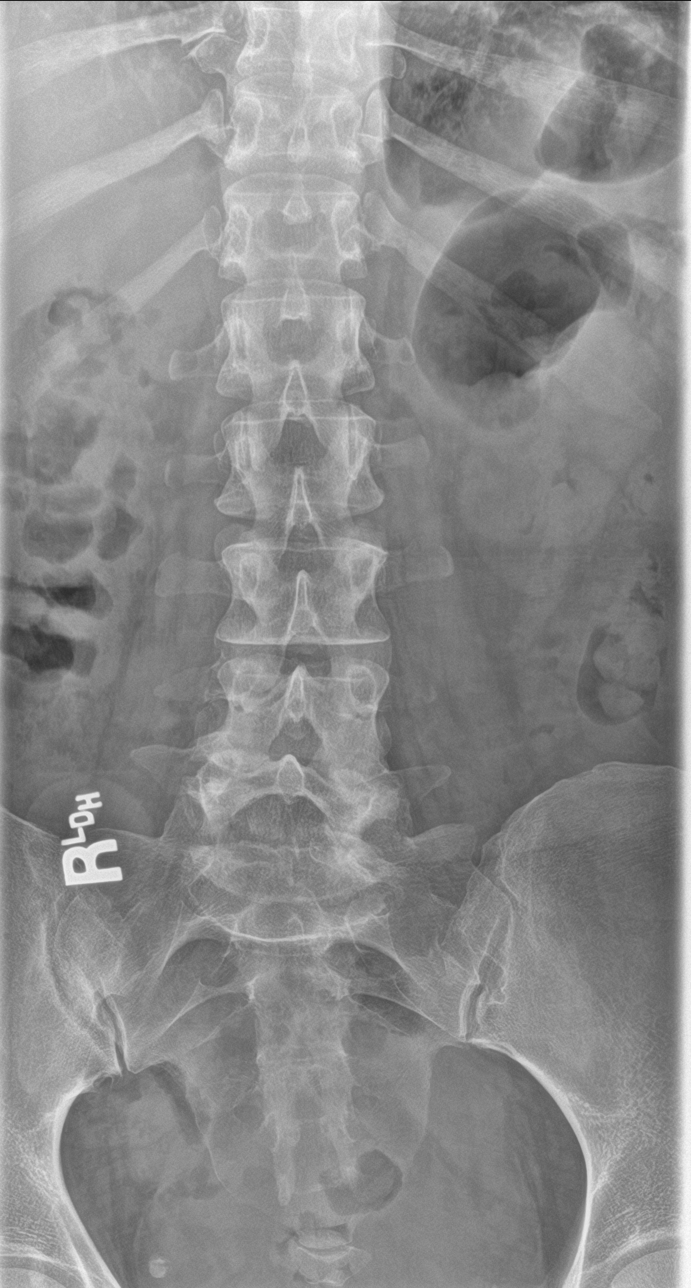
[im 2/5]
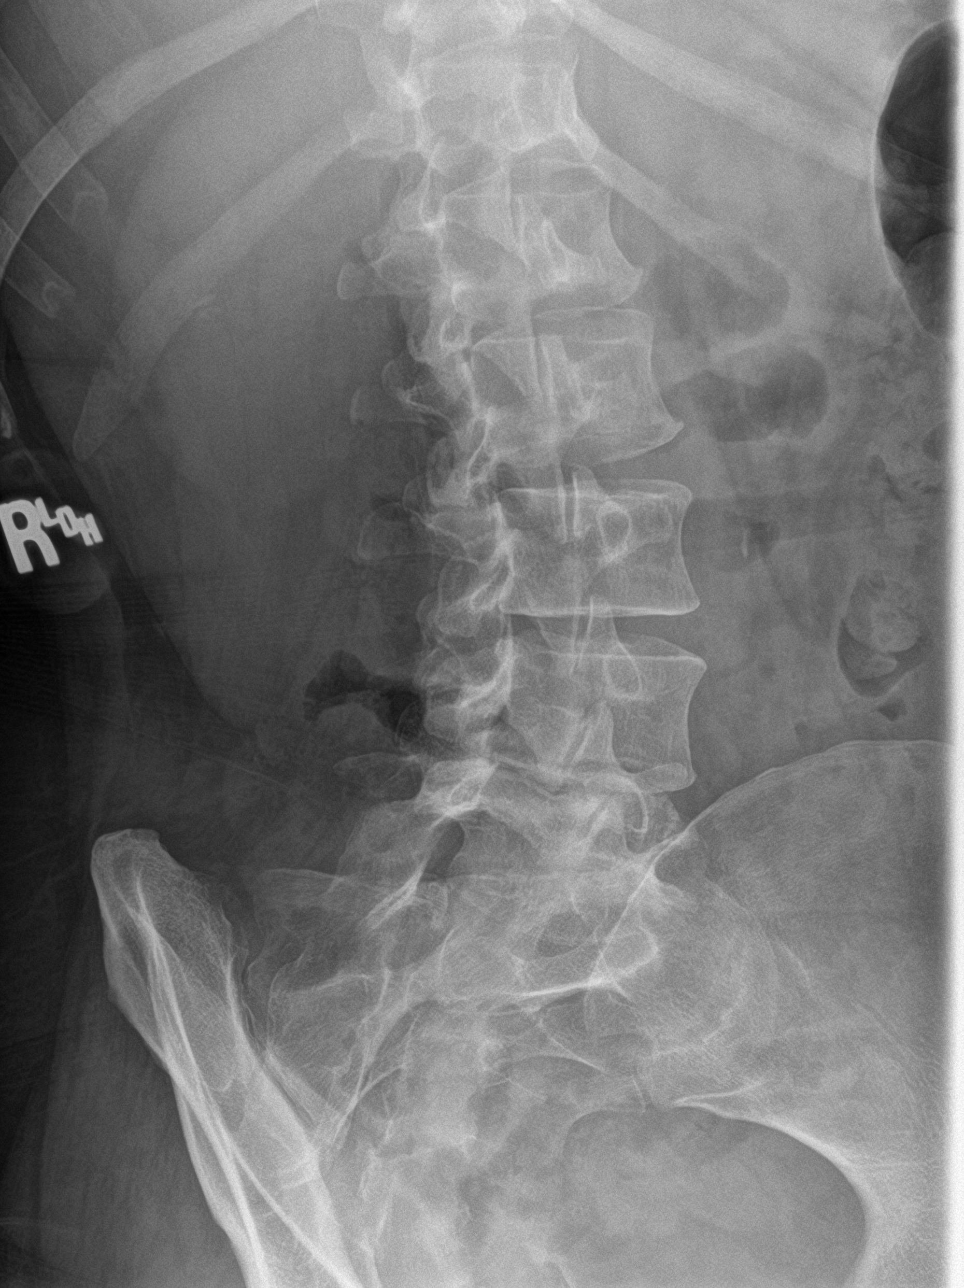
[im 3/5]
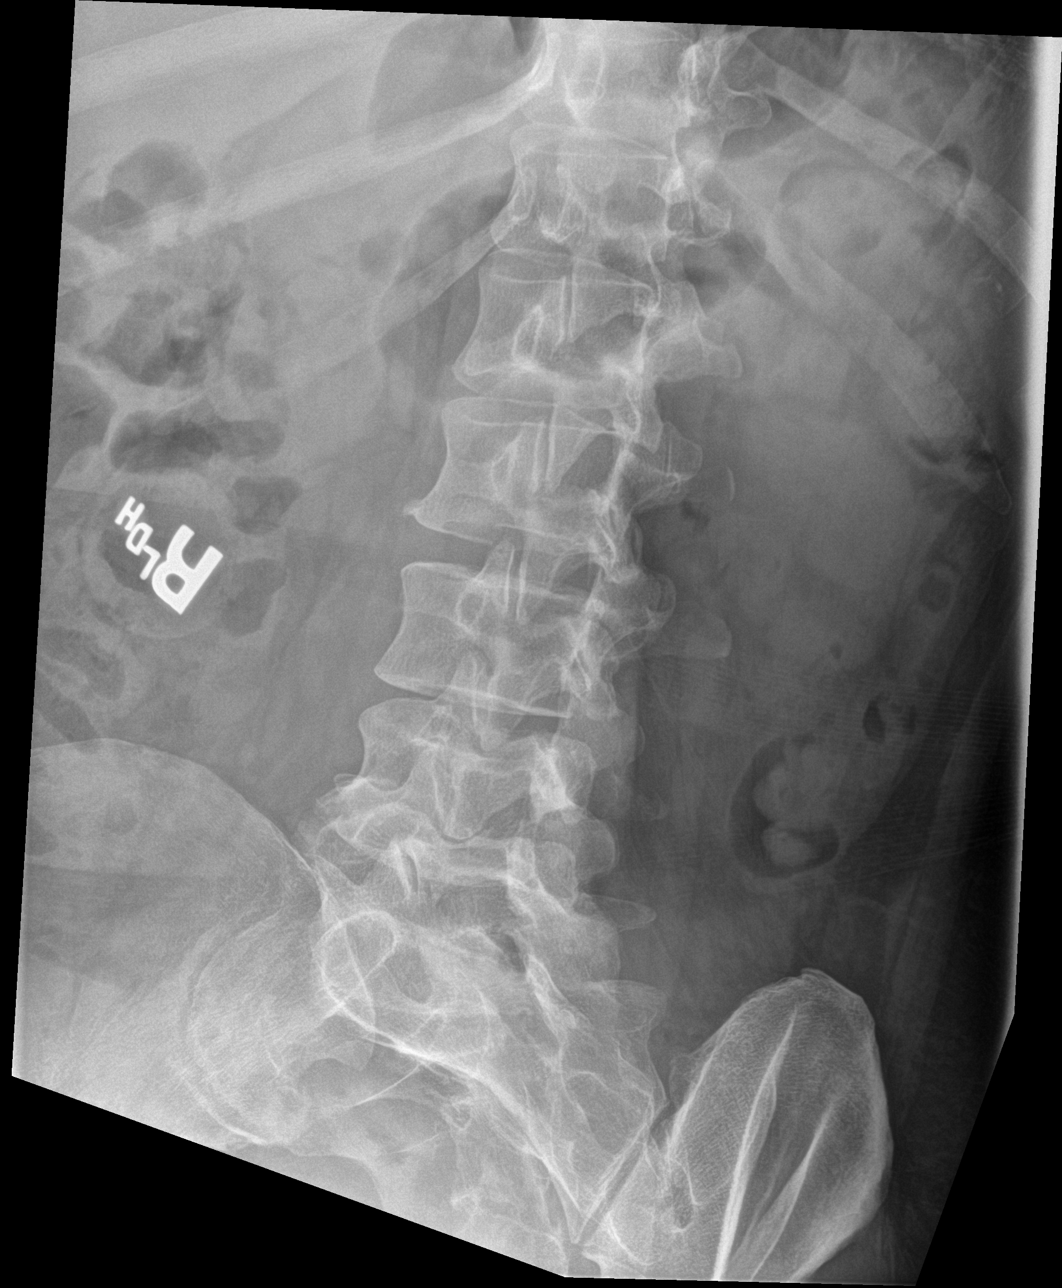
[im 4/5]
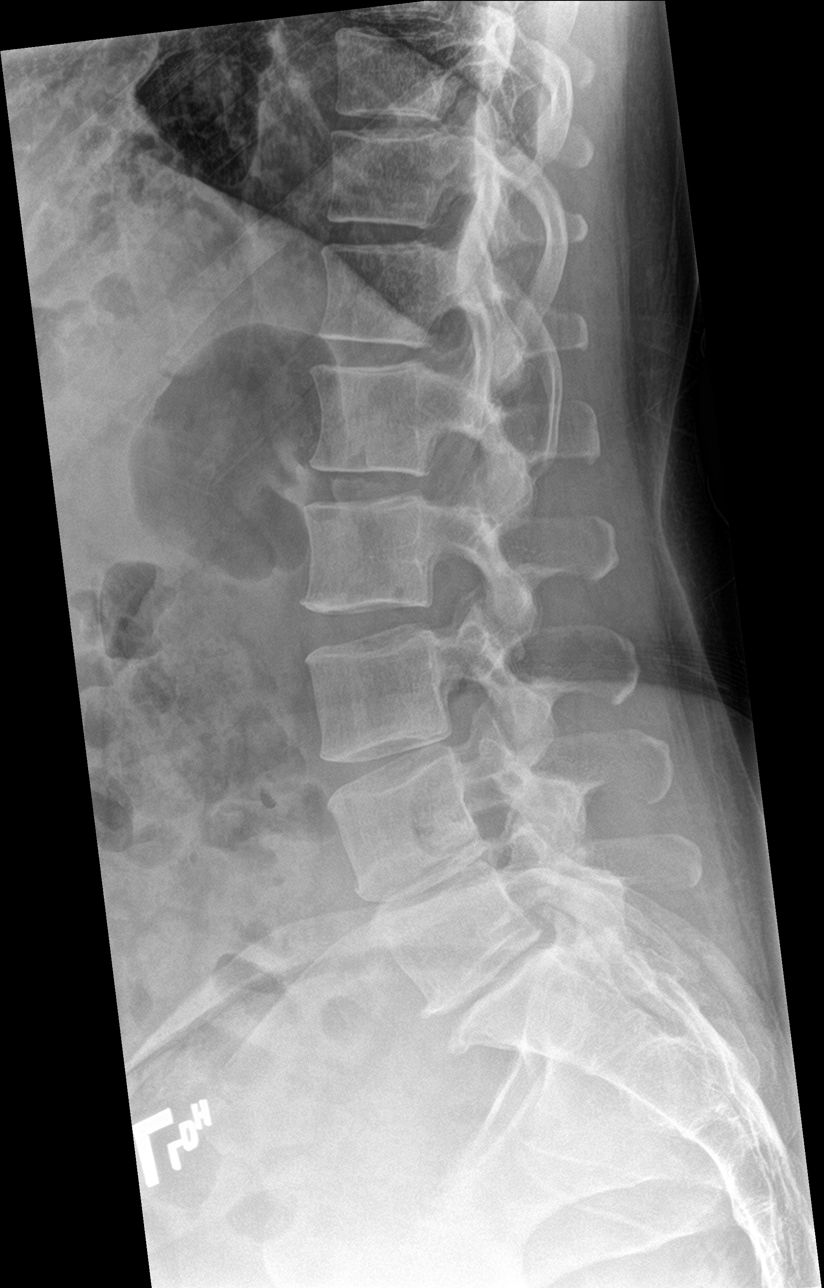
[im 5/5]
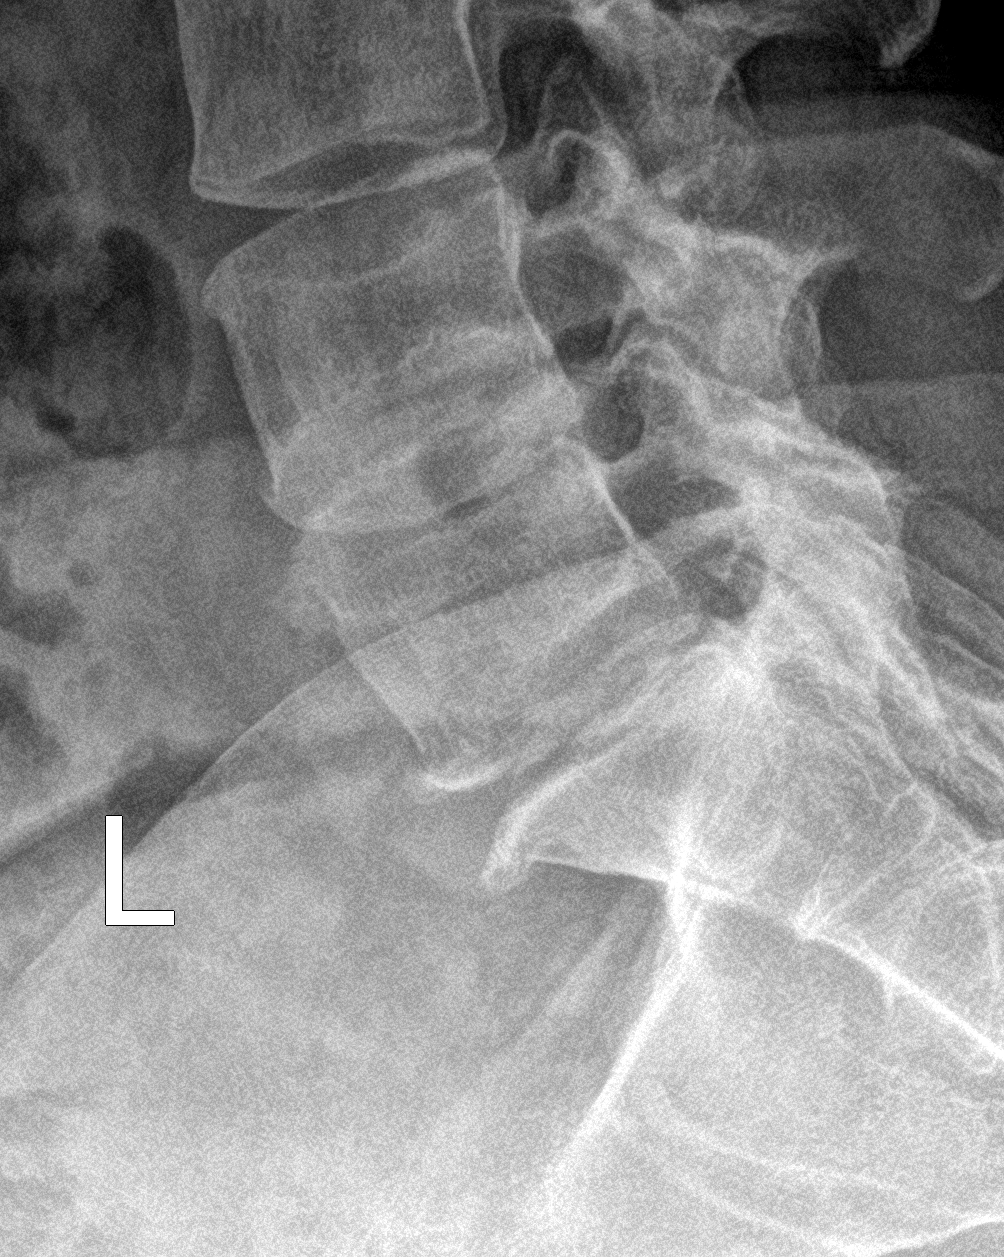

[5 of 5 positions shown; findings below may reference images not displayed]

FINDINGS: Frontal, lateral, spot lumbosacral lateral, and bilateral oblique
views were obtained. There are 5 non-rib-bearing lumbar type
vertebral bodies. There is no appreciable fracture or
spondylolisthesis. There is moderate disc space narrowing at L4-5
and L5-S1 with slightly milder disc space narrowing at L3-4. There
is mild facet osteoarthritic change at L5-S1 bilaterally.
IMPRESSION: Foci of osteoarthritic change in the lower lumbar spine. No fracture
or spondylolisthesis.

## 2021-04-07 IMAGING — CR THORACIC SPINE 2 VIEWS
1 series · 3 of 3 positions shown · non-contrast
Comparison: Chest radiograph October 09, 2018

CLINICAL DATA: Dorsalgia

EXAM:
THORACIC SPINE 2 VIEWS

[Series 1: dg thoracic spine 2 view · 0.14mm/px · 3 of 3 slices shown]
[im 1/3]
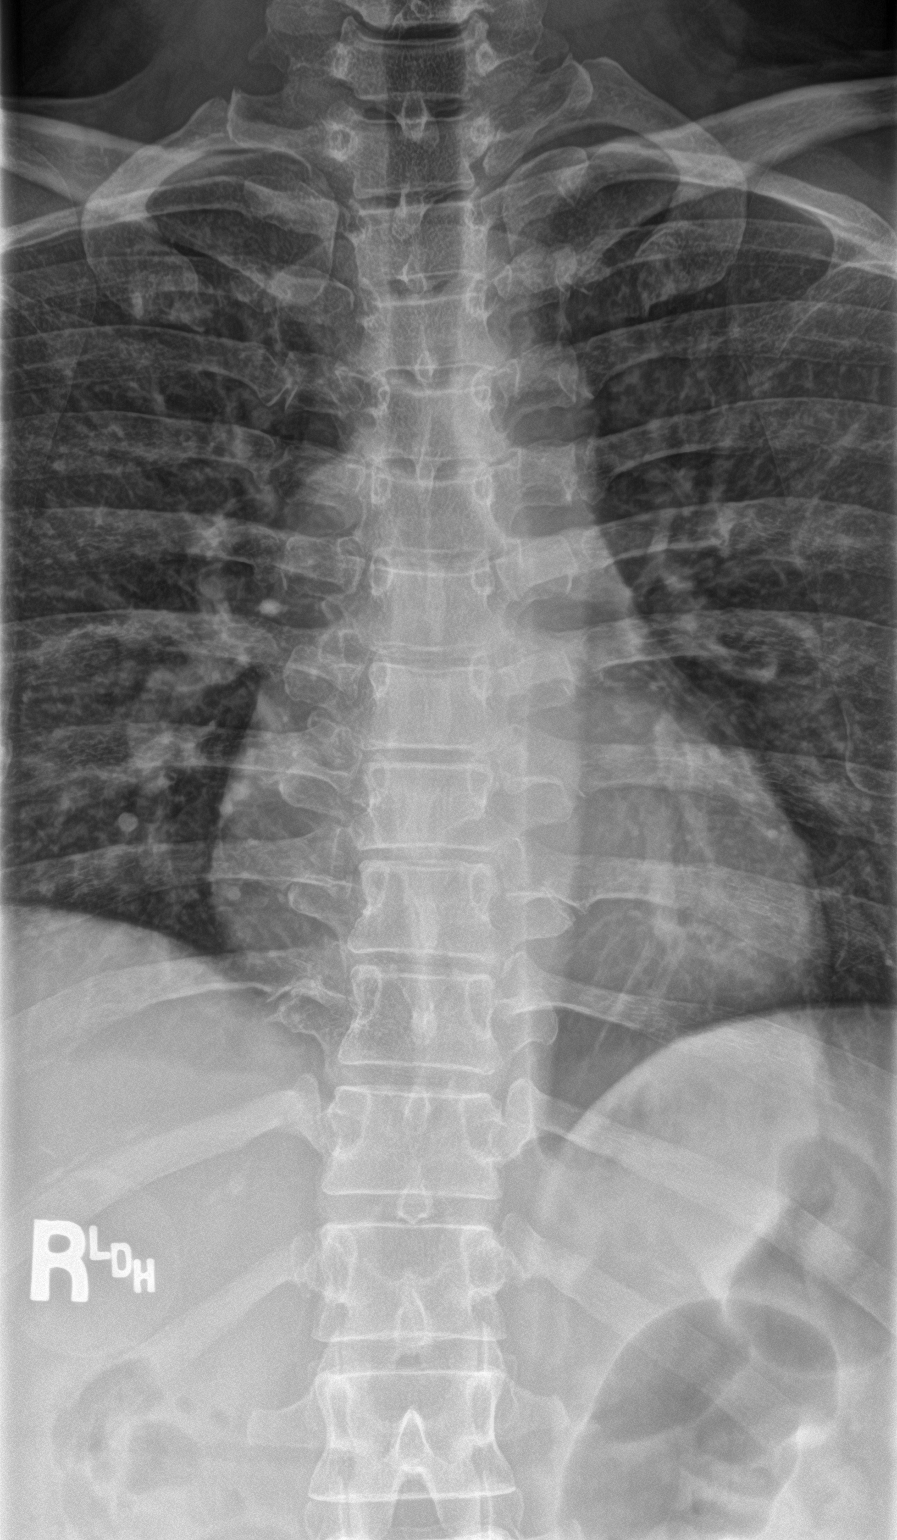
[im 2/3]
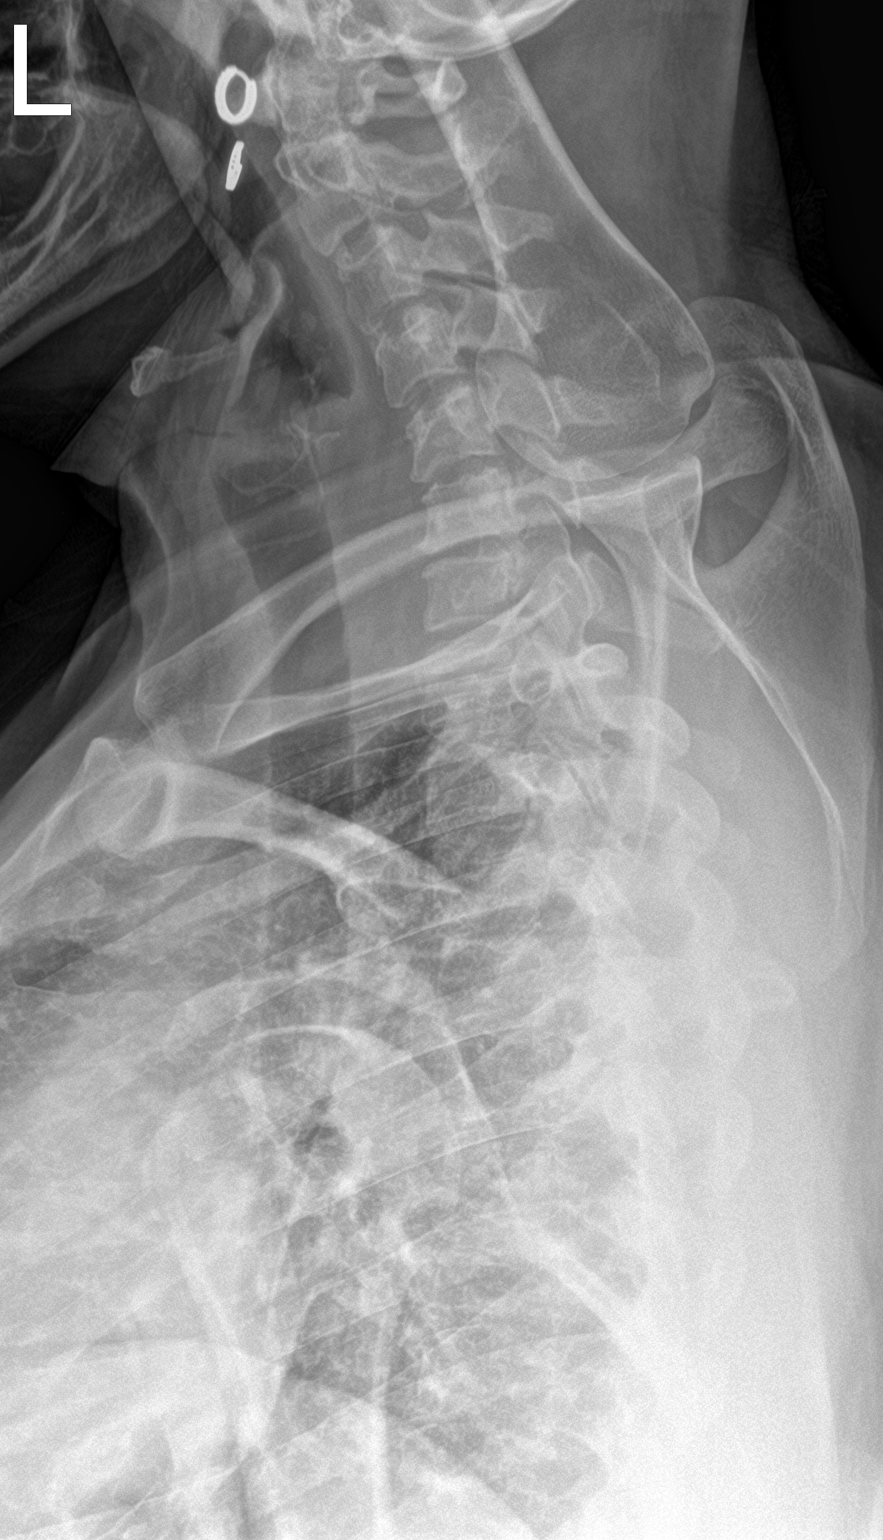
[im 3/3]
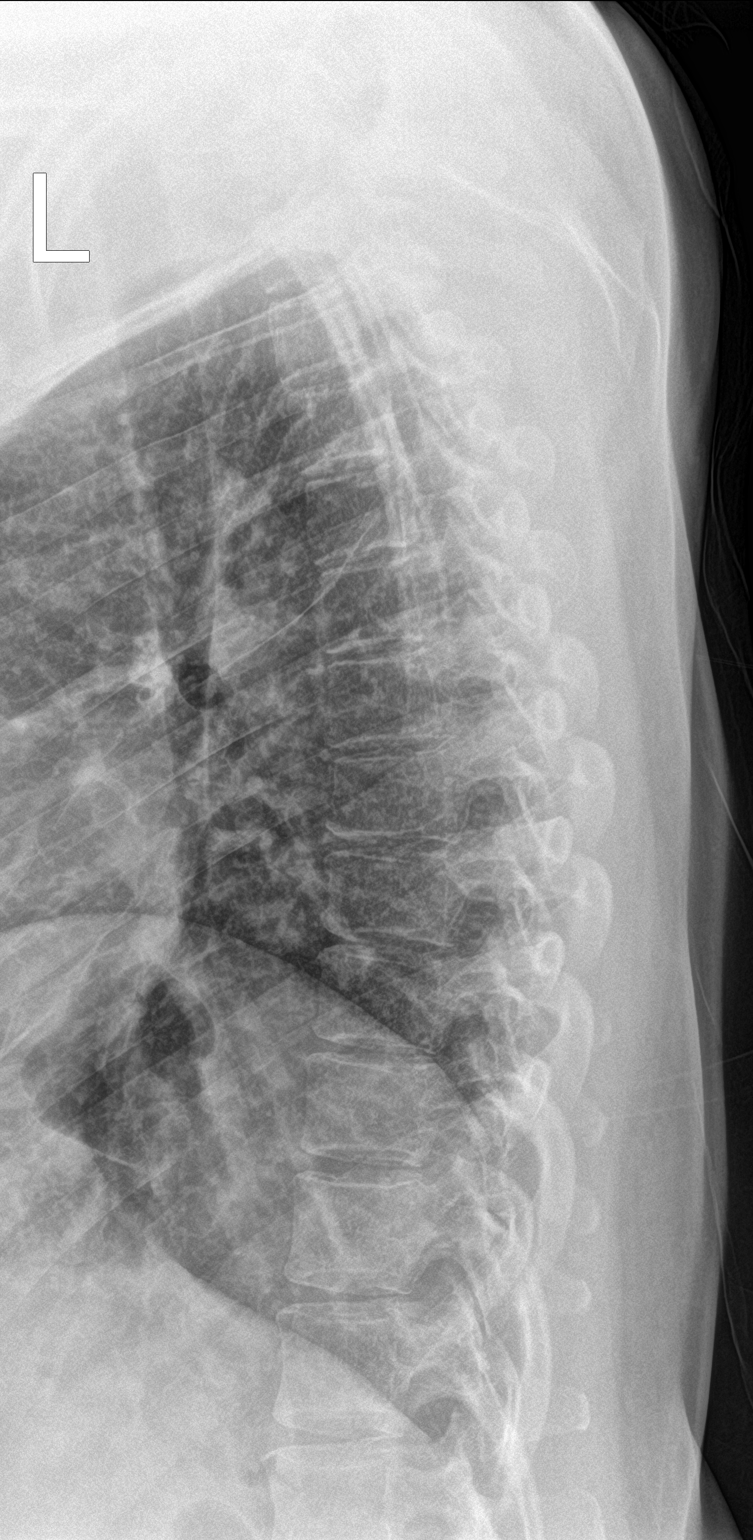

[3 of 3 positions shown; findings below may reference images not displayed]

FINDINGS: Frontal, lateral, and swimmer's views were obtained. There is no
fracture or spondylolisthesis. Disc spaces in the thoracic region
appear normal. No paraspinous lesion or erosive change. There is
mild osteoarthritic change in the lower cervical region.
IMPRESSION: No fracture or spondylolisthesis. No appreciable thoracic
arthropathy. Mild osteoarthritic change noted in the lower cervical
spine.

## 2021-06-01 ENCOUNTER — Ambulatory Visit: Payer: 59 | Admitting: Internal Medicine

## 2021-08-02 ENCOUNTER — Encounter: Payer: Self-pay | Admitting: Internal Medicine

## 2021-08-02 ENCOUNTER — Telehealth: Payer: 59 | Admitting: Physician Assistant

## 2021-08-02 DIAGNOSIS — U071 COVID-19: Secondary | ICD-10-CM

## 2021-08-02 MED ORDER — FLUTICASONE PROPIONATE 50 MCG/ACT NA SUSP
2.0000 | Freq: Every day | NASAL | 0 refills | Status: AC
Start: 2021-08-02 — End: ?

## 2021-08-02 MED ORDER — ALBUTEROL SULFATE HFA 108 (90 BASE) MCG/ACT IN AERS: 2.0000 | INHALATION_SPRAY | Freq: Four times a day (QID) | RESPIRATORY_TRACT | 0 refills | Status: DC | PRN

## 2021-08-02 MED ORDER — BENZONATATE 100 MG PO CAPS
100.0000 mg | ORAL_CAPSULE | Freq: Three times a day (TID) | ORAL | 0 refills | Status: DC | PRN
Start: 2021-08-02 — End: 2021-12-04

## 2021-08-02 NOTE — Telephone Encounter (Signed)
Patient called in and was scheduled by the front desk for 08/04/21 to be seen

## 2021-08-02 NOTE — Progress Notes (Signed)
E-Visit for Positive Covid Test Result We are sorry you are not feeling well. We are here to help!  You have tested positive for COVID-19, meaning that you were infected with the novel coronavirus and could give the virus to others.  It is vitally important that you stay home so you do not spread it to others.      Please continue isolation at home, for at least 10 days since the start of your symptoms and until you have had 24 hours with no fever (without taking a fever reducer) and with improving of symptoms.  If you have no symptoms but tested positive (or all symptoms resolve after 5 days and you have no fever) you can leave your house but continue to wear a mask around others for an additional 5 days. If you have a fever,continue to stay home until you have had 24 hours of no fever. Most cases improve 5-10 days from onset but we have seen a small number of patients who have gotten worse after the 10 days.  Please be sure to watch for worsening symptoms and remain taking the proper precautions.   Go to the nearest hospital ED for assessment if fever/cough/breathlessness are severe or illness seems like a threat to life.    The following symptoms may appear 2-14 days after exposure: Fever Cough Shortness of breath or difficulty breathing Chills Repeated shaking with chills Muscle pain Headache Sore throat New loss of taste or smell Fatigue Congestion or runny nose Nausea or vomiting Diarrhea  You have been enrolled in MyChart Home Monitoring for COVID-19. Daily you will receive a questionnaire within the MyChart website. Our COVID-19 response team will be monitoring your responses daily.  You can use medication such as prescription cough medication called Tessalon Perles 100 mg. You may take 1-2 capsules every 8 hours as needed for cough,  prescription inhaler called Albuterol MDI 90 mcg /actuation 2 puffs every 4 hours as needed for shortness of breath, wheezing, cough, and  prescription for Fluticasone nasal spray 2 sprays in each nostril one time per day  You may also take acetaminophen (Tylenol) as needed for fever.  HOME CARE: Only take medications as instructed by your medical team. Drink plenty of fluids and get plenty of rest. A steam or ultrasonic humidifier can help if you have congestion.   GET HELP RIGHT AWAY IF YOU HAVE EMERGENCY WARNING SIGNS.  Call 911 or proceed to your closest emergency facility if: You develop worsening high fever. Trouble breathing Bluish lips or face Persistent pain or pressure in the chest New confusion Inability to wake or stay awake You cough up blood. Your symptoms become more severe Inability to hold down food or fluids  This list is not all possible symptoms. Contact your medical provider for any symptoms that are severe or concerning to you.    Your e-visit answers were reviewed by a board certified advanced clinical practitioner to complete your personal care plan.  Depending on the condition, your plan could have included both over the counter or prescription medications.  If there is a problem please reply once you have received a response from your provider.  Your safety is important to us.  If you have drug allergies check your prescription carefully.    You can use MyChart to ask questions about today's visit, request a non-urgent call back, or ask for a work or school excuse for 24 hours related to this e-Visit. If it has been greater than 24   hours you will need to follow up with your provider, or enter a new e-Visit to address those concerns. You will get an e-mail in the next two days asking about your experience.  I hope that your e-visit has been valuable and will speed your recovery. Thank you for using e-visits.   I provided 5 minutes of non face-to-face time during this encounter for chart review and documentation.   

## 2021-08-03 ENCOUNTER — Telehealth: Payer: Self-pay | Admitting: Physician Assistant

## 2021-08-03 ENCOUNTER — Encounter: Payer: Self-pay | Admitting: Physician Assistant

## 2021-08-03 DIAGNOSIS — U071 COVID-19: Secondary | ICD-10-CM

## 2021-08-03 MED ORDER — MOLNUPIRAVIR EUA 200MG CAPSULE: 4.0000 | ORAL_CAPSULE | Freq: Two times a day (BID) | ORAL | 0 refills | Status: AC

## 2021-08-03 NOTE — Patient Instructions (Signed)
[name], thank you for joining Piedad Climes, PA-C for today's virtual visit.  While this provider is not your primary care provider (PCP), if your PCP is located in our provider database this encounter information will be shared with them immediately following your visit.  Consent: (Patient) [name] provided verbal consent for this virtual visit at the beginning of the encounter.  Current Medications:  Current Outpatient Medications:    molnupiravir EUA (LAGEVRIO) 200 mg CAPS capsule, Take 4 capsules (800 mg total) by mouth 2 (two) times daily for 5 days., Disp: 40 capsule, Rfl: 0   albuterol (VENTOLIN HFA) 108 (90 Base) MCG/ACT inhaler, Inhale 2 puffs into the lungs every 6 (six) hours as needed for wheezing or shortness of breath., Disp: 8 g, Rfl: 0   benzonatate (TESSALON) 100 MG capsule, Take 1 capsule (100 mg total) by mouth 3 (three) times daily as needed., Disp: 30 capsule, Rfl: 0   buPROPion (WELLBUTRIN XL) 300 MG 24 hr tablet, Take 1 tablet (300 mg total) by mouth daily., Disp: 30 tablet, Rfl: 0   fluticasone (FLONASE) 50 MCG/ACT nasal spray, Place 2 sprays into both nostrils daily., Disp: 16 g, Rfl: 0   gabapentin (NEURONTIN) 400 MG capsule, Take 1 capsule (400 mg total) by mouth 3 (three) times daily., Disp: 90 capsule, Rfl: 0   lamoTRIgine (LAMICTAL) 100 MG tablet, Take 1 tablet (100 mg total) by mouth daily., Disp: 30 tablet, Rfl: 0   traZODone (DESYREL) 100 MG tablet, Take 1 tablet (100 mg total) by mouth at bedtime as needed for sleep., Disp: 30 tablet, Rfl: 0   Medications ordered in this encounter:  Meds ordered this encounter  Medications   molnupiravir EUA (LAGEVRIO) 200 mg CAPS capsule    Sig: Take 4 capsules (800 mg total) by mouth 2 (two) times daily for 5 days.    Dispense:  40 capsule    Refill:  0    Order Specific Question:   Supervising Provider    Answer:   Hyacinth Meeker, BRIAN [[id number]]     *If you need refills on other medications prior to  your next appointment, please contact your pharmacy*  Follow-Up: Call back or seek an in-person evaluation if the symptoms worsen or if the condition fails to improve as anticipated.  Other Instructions Please keep well-hydrated and get plenty of rest. Start a saline nasal rinse to flush out your nasal passages. You can use plain Mucinex to help thin congestion. If you have a humidifier, running in the bedroom at night. I want you to start OTC vitamin D3 1000 units daily, vitamin C 1000 mg daily, and a zinc supplement. Please take prescribed medications as directed.  You have been enrolled in a MyChart symptom monitoring program. Please answer these questions daily so we can keep track of how you are doing.  You were to quarantine for 5 days from onset of your symptoms.  After day 5, if you have had no fever and you are feeling better, you can end quarantine but need to mask for an additional 5 days. After day 5 if you have a fever or are having significant symptoms, please quarantine for full 10 days.  If you note any worsening of symptoms, any significant shortness of breath or any chest pain, please seek ER evaluation ASAP.  Please do not delay care!  COVID-19: What to Do if You Are Sick If you test positive and are an older adult or someone who is at high  risk of getting very sick from COVID-19, treatment may be available. Contact a healthcare provider right away after a positive test to determine if you are eligible, even if your symptoms are mild right now. You can also visit a Test to Treat location and, if eligible, receive a prescription from a provider. Don't delay: Treatment must be started within the first few days to be effective. If you have a fever, cough, or other symptoms, you might have COVID-19. Most people have mild illness and are able to recover at home. If you are sick: Keep track of your symptoms. If you have an emergency warning sign (including trouble breathing),  call 911. Steps to help prevent the spread of COVID-19 if you are sick If you are sick with COVID-19 or think you might have COVID-19, follow the steps below to care for yourself and to help protect other people in your home and community. Stay home except to get medical care Stay home. Most people with COVID-19 have mild illness and can recover at home without medical care. Do not leave your home, except to get medical care. Do not visit public areas and do not go to places where you are unable to wear a mask. Take care of yourself. Get rest and stay hydrated. Take over-the-counter medicines, such as acetaminophen, to help you feel better. Stay in touch with your doctor. Call before you get medical care. Be sure to get care if you have trouble breathing, or have any other emergency warning signs, or if you think it is an emergency. Avoid public transportation, ride-sharing, or taxis if possible. Get tested If you have symptoms of COVID-19, get tested. While waiting for test results, stay away from others, including staying apart from those living in your household. Get tested as soon as possible after your symptoms start. Treatments may be available for people with COVID-19 who are at risk for becoming very sick. Don't delay: Treatment must be started early to be effective--some treatments must begin within 5 days of your first symptoms. Contact your healthcare provider right away if your test result is positive to determine if you are eligible. Self-tests are one of several options for testing for the virus that causes COVID-19 and may be more convenient than laboratory-based tests and point-of-care tests. Ask your healthcare provider or your local health department if you need help interpreting your test results. You can visit your state, tribal, local, and territorial health department's website to look for the latest local information on testing sites. Separate yourself from other people As much  as possible, stay in a specific room and away from other people and pets in your home. If possible, you should use a separate bathroom. If you need to be around other people or animals in or outside of the home, wear a well-fitting mask. Tell your close contacts that they may have been exposed to COVID-19. An infected person can spread COVID-19 starting 48 hours (or 2 days) before the person has any symptoms or tests positive. By letting your close contacts know they may have been exposed to COVID-19, you are helping to protect everyone. See COVID-19 and Animals if you have questions about pets. If you are diagnosed with COVID-19, someone from the health department may call you. Answer the call to slow the spread. Monitor your symptoms Symptoms of COVID-19 include fever, cough, or other symptoms. Follow care instructions from your healthcare provider and local health department. Your local health authorities may give instructions on checking your  symptoms and reporting information. When to seek emergency medical attention Look for emergency warning signs* for COVID-19. If someone is showing any of these signs, seek emergency medical care immediately: Trouble breathing Persistent pain or pressure in the chest New confusion Inability to wake or stay awake Pale, gray, or blue-colored skin, lips, or nail beds, depending on skin tone *This list is not all possible symptoms. Please call your medical provider for any other symptoms that are severe or concerning to you. Call 911 or call ahead to your local emergency facility: Notify the operator that you are seeking care for someone who has or may have COVID-19. Call ahead before visiting your doctor Call ahead. Many medical visits for routine care are being postponed or done by phone or telemedicine. If you have a medical appointment that cannot be postponed, call your doctor's office, and tell them you have or may have COVID-19. This will help the office  protect themselves and other patients. If you are sick, wear a well-fitting mask You should wear a mask if you must be around other people or animals, including pets (even at home). Wear a mask with the best fit, protection, and comfort for you. You don't need to wear the mask if you are alone. If you can't put on a mask (because of trouble breathing, for example), cover your coughs and sneezes in some other way. Try to stay at least 6 feet away from other people. This will help protect the people around you. Masks should not be placed on young children under age 38 years, anyone who has trouble breathing, or anyone who is not able to remove the mask without help. Cover your coughs and sneezes Cover your mouth and nose with a tissue when you cough or sneeze. Throw away used tissues in a lined trash can. Immediately wash your hands with soap and water for at least 20 seconds. If soap and water are not available, clean your hands with an alcohol-based hand sanitizer that contains at least 60% alcohol. Clean your hands often Wash your hands often with soap and water for at least 20 seconds. This is especially important after blowing your nose, coughing, or sneezing; going to the bathroom; and before eating or preparing food. Use hand sanitizer if soap and water are not available. Use an alcohol-based hand sanitizer with at least 60% alcohol, covering all surfaces of your hands and rubbing them together until they feel dry. Soap and water are the best option, especially if hands are visibly dirty. Avoid touching your eyes, nose, and mouth with unwashed hands. Handwashing Tips Avoid sharing personal household items Do not share dishes, drinking glasses, cups, eating utensils, towels, or bedding with other people in your home. Wash these items thoroughly after using them with soap and water or put in the dishwasher. Clean surfaces in your home regularly Clean and disinfect high-touch surfaces (for  example, doorknobs, tables, handles, light switches, and countertops) in your "sick room" and bathroom. In shared spaces, you should clean and disinfect surfaces and items after each use by the person who is ill. If you are sick and cannot clean, a caregiver or other person should only clean and disinfect the area around you (such as your bedroom and bathroom) on an as needed basis. Your caregiver/other person should wait as long as possible (at least several hours) and wear a mask before entering, cleaning, and disinfecting shared spaces that you use. Clean and disinfect areas that may have blood, stool, or body  fluids on them. Use household cleaners and disinfectants. Clean visible dirty surfaces with household cleaners containing soap or detergent. Then, use a household disinfectant. Use a product from [employer] List N: Disinfectants for Coronavirus (COVID-19). Be sure to follow the instructions on the label to ensure safe and effective use of the product. Many products recommend keeping the surface wet with a disinfectant for a certain period of time (look at "contact time" on the product label). You may also need to wear personal protective equipment, such as gloves, depending on the directions on the product label. Immediately after disinfecting, wash your hands with soap and water for 20 seconds. For completed guidance on cleaning and disinfecting your home, visit Complete Disinfection Guidance. Take steps to improve ventilation at home Improve ventilation (air flow) at home to help prevent from spreading COVID-19 to other people in your household. Clear out COVID-19 virus particles in the air by opening windows, using air filters, and turning on fans in your home. Use this interactive tool to learn how to improve air flow in your home. When you can be around others after being sick with COVID-19 Deciding when you can be around others is different for different situations. Find out when you can  safely end home isolation. For any additional questions about your care, contact your healthcare provider or state or local health department. 10/25/2020 Content source: [facility] for Immunization and Respiratory Diseases ([facility]), Division of Viral Diseases This information is not intended to replace advice given to you by your health care provider. Make sure you discuss any questions you have with your health care provider. Document Revised: 12/08/2020 Document Reviewed: 12/08/2020 Elsevier Patient Education  2022 [employer].      If you have been instructed to have an in-person evaluation today at a local Urgent Care facility, please use the link below. It will take you to a list of all of our available [facility], including address, phone number and hours of operation. Please do not delay care.  [facility]  If you or a family member do not have a primary care provider, use the link below to schedule a visit and establish care. When you choose a [facility] primary care physician or advanced practice provider, you gain a long-term partner in health. Find a Primary Care Provider  Learn more about [facility]'s in-office and virtual care options:  - Get Care Now

## 2021-08-03 NOTE — Progress Notes (Signed)
Virtual Visit Consent   [name], you are scheduled for a virtual visit with a [facility] provider today.     Just as with appointments in the office, your consent must be obtained to participate.  Your consent will be active for this visit and any virtual visit you may have with one of our providers in the next 365 days.     If you have a MyChart account, a copy of this consent can be sent to you electronically.  All virtual visits are billed to your insurance company just like a traditional visit in the office.    As this is a virtual visit, video technology does not allow for your provider to perform a traditional examination.  This may limit your provider's ability to fully assess your condition.  If your provider identifies any concerns that need to be evaluated in person or the need to arrange testing (such as labs, EKG, etc.), we will make arrangements to do so.     Although advances in technology are sophisticated, we cannot ensure that it will always work on either your end or our end.  If the connection with a video visit is poor, the visit may have to be switched to a telephone visit.  With either a video or telephone visit, we are not always able to ensure that we have a secure connection.     I need to obtain your verbal consent now.   Are you willing to proceed with your visit today?    [name] has provided verbal consent on 08/03/2021 for a virtual visit (video or telephone).   Leeanne Rio, [place]   Date: 08/03/2021 8:24 AM   Virtual Visit via Video Note   I, Leeanne Rio, connected with  [name]  ([id number], 1981/02/22) on 08/03/21 at  8:15 AM EST by a video-enabled telemedicine application and verified that I am speaking with the correct person using two identifiers.  Location: Patient: Virtual Visit Location Patient: Home Provider: Virtual Visit Location Provider: Home Office   I discussed the limitations of evaluation and  management by telemedicine and the availability of in person appointments. The patient expressed understanding and agreed to proceed.    History of Present Illness: [name] is a 40 y.o. who identifies as a female who was assigned female at birth, and is being seen today for COVID-19. Patient endorses symptoms starting 2.5 days ago with fever, sore throat, headache, congestion and frequent cough. Tested positive for COVID on 12/27. Was seen via e-visit and started on Tessalon on Albuterol which she is using as directed but still with substantial symptoms. Denies chest pain but still noting some chest tightness. Last use of albuterol inhaler was this morning.   HPI: HPI  Problems:  Patient Active Problem List   Diagnosis Date Noted   Annual physical exam 10/28/2019   Overweight (BMI 25.0-29.9) 10/28/2019   Attention deficit disorder 08/29/2018   Left carpal tunnel syndrome 05/28/2018   Numbness and tingling in left arm 05/28/2018   Hyperlipidemia 05/28/2018   Cervical radiculopathy at C6 05/13/2018   Hepatitis C 03/04/2018   Substance abuse ([place]) 02/14/2018   Insomnia 02/14/2018   Bipolar disorder ([place]) 02/14/2018   Myositis 02/16/2014   Anxiety and depression 02/16/2014    Allergies:  Allergies  Allergen Reactions   Chantix [Varenicline Tartrate]     Chest pressure and bugs crawling    Latex Itching    gloves   Medications:  Current Outpatient Medications:    molnupiravir EUA (LAGEVRIO) 200 mg CAPS capsule, Take 4 capsules (800 mg total) by mouth 2 (two) times daily for 5 days., Disp: 40 capsule, Rfl: 0   albuterol (VENTOLIN HFA) 108 (90 Base) MCG/ACT inhaler, Inhale 2 puffs into the lungs every 6 (six) hours as needed for wheezing or shortness of breath., Disp: 8 g, Rfl: 0   benzonatate (TESSALON) 100 MG capsule, Take 1 capsule (100 mg total) by mouth 3 (three) times daily as needed., Disp: 30 capsule, Rfl: 0   buPROPion (WELLBUTRIN XL) 300 MG 24 hr tablet, Take 1 tablet  (300 mg total) by mouth daily., Disp: 30 tablet, Rfl: 0   fluticasone (FLONASE) 50 MCG/ACT nasal spray, Place 2 sprays into both nostrils daily., Disp: 16 g, Rfl: 0   gabapentin (NEURONTIN) 400 MG capsule, Take 1 capsule (400 mg total) by mouth 3 (three) times daily., Disp: 90 capsule, Rfl: 0   lamoTRIgine (LAMICTAL) 100 MG tablet, Take 1 tablet (100 mg total) by mouth daily., Disp: 30 tablet, Rfl: 0   traZODone (DESYREL) 100 MG tablet, Take 1 tablet (100 mg total) by mouth at bedtime as needed for sleep., Disp: 30 tablet, Rfl: 0  Observations/Objective: Patient is well-developed, well-nourished in no acute distress.  Resting comfortably at home.  Head is normocephalic, atraumatic.  No labored breathing. Speech is clear and coherent with logical content.  Patient is alert and oriented at baseline.   Assessment and Plan: 1. COVID-19 - molnupiravir EUA (LAGEVRIO) 200 mg CAPS capsule; Take 4 capsules (800 mg total) by mouth 2 (two) times daily for 5 days.  Dispense: 40 capsule; Refill: 0  Discussed risks/benefits of antiviral medications including most common potential ADRs. Patient voiced understanding and would like to proceed with antiviral medication. They are candidate for molnupiravir. Rx sent to pharmacy. Supportive measures, OTC medications and vitamin regimen reviewed. Continue Tessalon and Albuterol per previous provider orders. Patient has been enrolled in a MyChart COVID symptom monitoring program. Anne Shutter reviewed in detail. Strict ER precautions discussed with patient.    Follow Up Instructions: I discussed the assessment and treatment plan with the patient. The patient was provided an opportunity to ask questions and all were answered. The patient agreed with the plan and demonstrated an understanding of the instructions.  A copy of instructions were sent to the patient via MyChart unless otherwise noted below.   The patient was advised to call back or seek an in-person  evaluation if the symptoms worsen or if the condition fails to improve as anticipated.  Time:  I spent 12 minutes with the patient via telehealth technology discussing the above problems/concerns.    Piedad Climes, PA-C

## 2021-08-04 ENCOUNTER — Telehealth: Payer: 59 | Admitting: Family

## 2021-11-20 ENCOUNTER — Encounter: Payer: Self-pay | Admitting: Internal Medicine

## 2021-12-04 ENCOUNTER — Telehealth: Payer: Self-pay | Admitting: Physician Assistant

## 2021-12-04 DIAGNOSIS — J069 Acute upper respiratory infection, unspecified: Secondary | ICD-10-CM

## 2021-12-04 MED ORDER — ALBUTEROL SULFATE HFA 108 (90 BASE) MCG/ACT IN AERS: 2.0000 | INHALATION_SPRAY | Freq: Four times a day (QID) | RESPIRATORY_TRACT | 0 refills | Status: AC | PRN

## 2021-12-04 MED ORDER — BENZONATATE 100 MG PO CAPS: 100.0000 mg | ORAL_CAPSULE | Freq: Three times a day (TID) | ORAL | 0 refills | Status: AC | PRN

## 2021-12-04 MED ORDER — IPRATROPIUM BROMIDE 0.03 % NA SOLN: 2.0000 | Freq: Two times a day (BID) | NASAL | 0 refills | Status: AC

## 2021-12-04 NOTE — Progress Notes (Signed)

## 2021-12-06 ENCOUNTER — Encounter: Payer: Self-pay | Admitting: Internal Medicine

## 2022-02-23 ENCOUNTER — Ambulatory Visit: Payer: Self-pay | Admitting: Internal Medicine

## 2022-05-04 ENCOUNTER — Other Ambulatory Visit: Payer: Self-pay

## 2022-05-04 ENCOUNTER — Emergency Department: Payer: Commercial Managed Care - PPO

## 2022-05-04 ENCOUNTER — Emergency Department
Admission: EM | Admit: 2022-05-04 | Discharge: 2022-05-04 | Disposition: A | Discharge status: Home / Self Care | Payer: Commercial Managed Care - PPO | Attending: Emergency Medicine | Admitting: Emergency Medicine

## 2022-05-04 DIAGNOSIS — M7989 Other specified soft tissue disorders: Secondary | ICD-10-CM | POA: Diagnosis not present

## 2022-05-04 DIAGNOSIS — R202 Paresthesia of skin: Secondary | ICD-10-CM

## 2022-05-04 NOTE — Discharge Instructions (Addendum)
-  Please follow-up with the orthopedist listed in these instructions (Dr. Posey Pronto)  -You may take Tylenol/ibuprofen as needed for the pain.  -Return to the emergency department anytime if you begin to experience any new or worsening symptoms.

## 2022-05-04 NOTE — ED Provider Notes (Signed)
[facility] Provider Note    Event Date/Time   First MD Initiated Contact with Patient 05/04/22 2146     (approximate)   History   Chief Complaint Tingling   HPI [name] is a 41 y.o. female, history of anxiety/depression, bipolar disorder, carpal tunnel, cervical radiculopathy at C6, hyperlipidemia, ADD, presents to the emergency department for evaluation of arm pain/numbness/tingling.  Patient states that she has been having tingling in her right hand over the past month.  She states that she did have a large "lump" just above the antecubital region on the right arm that developed suddenly.  It has since decreased in size, however she feels a another possible lump/tender spot developing along the medial aspect of the midshaft humerus region.  She states that she has been evaluated for this before, however nobody seems to be giving her answers.  She had an ultrasound of the arm done which was reportedly negative.  Denies any recent falls or injuries.  She does state that she has had cellulitis in the same region before, which is required IV antibiotics.  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  Denies fever/chills, chest pain, shortness of breath, abdominal pain, flank pain, nausea/vomiting, diarrhea, dysuria, rash/lesions, dizziness/lightheadedness, cold sensation in the affected extremity, or head/neck pain.  History Limitations: No limitations.        Physical Exam  Triage Vital Signs: ED Triage Vitals  Enc Vitals Group     BP 05/04/22 2057 123/84     Pulse Rate 05/04/22 2057 76     Resp 05/04/22 2057 18     Temp 05/04/22 2057 98.2 F (36.8 C)     Temp Source 05/04/22 2057 Oral     SpO2 05/04/22 2057 94 %     Weight 05/04/22 2118 180 lb (81.6 kg)     Height 05/04/22 2118 5\' 7"  (1.702 m)     Head Circumference --      Peak Flow --      Pain Score 05/04/22 2118 5     Pain Loc --      Pain Edu? --      Excl. in  [place]? --     Most recent vital signs: Vitals:   05/04/22 2057  BP: 123/84  Pulse: 76  Resp: 18  Temp: 98.2 F (36.8 C)  SpO2: 94%    General: Awake, NAD.  Skin: Warm, dry. No rashes or lesions.  Eyes: PERRL. Conjunctivae normal.  CV: Good peripheral perfusion.  Resp: Normal effort.  Abd: Soft, non-tender. No distention.  Neuro: At baseline. No gross neurological deficits.  Musculoskeletal: Normal ROM of all extremities.  Focused Exam: Notable soft tissue swelling superior to the antecubital fossa on the right upper extremity.  Soft, nontender.  Mild swelling appreciated in the medial aspect of the right humerus region as well, soft, nontender.  No overlying erythema.  Normal range of motion at the shoulder joint, elbow joint, and wrist.  Positive Tinel's sign.  PMS intact distally.   Physical Exam    ED Results / Procedures / Treatments  Labs (all labs ordered are listed, but only abnormal results are displayed) Labs Reviewed - No data to display   EKG N/A.    RADIOLOGY  ED Provider Interpretation: I personally viewed and interpreted this x-ray, no evidence of acute fractures or dislocations.  DG Humerus Right  Result Date: 05/04/2022 CLINICAL DATA:  Right arm swelling. EXAM:  RIGHT HUMERUS - 2+ VIEW COMPARISON:  None Available. FINDINGS: There is no evidence of fracture or other focal bone lesions. Soft tissues are unremarkable. IMPRESSION: Negative. Electronically Signed   By: Keane Police D.O.   On: 05/04/2022 22:26    PROCEDURES:  Critical Care performed: N/A.  Procedures    MEDICATIONS ORDERED IN ED: Medications - No data to display   IMPRESSION / MDM / [place] / ED COURSE  I reviewed the triage vital signs and the nursing notes.                              Differential diagnosis includes, but is not limited to, ganglion cyst, hematoma, carpal tunnel, cervical radiculopathy, musculoskeletal strain.  Assessment/Plan Patient presents  with soft tissue swelling in the right upper arm, associated with tenderness and paresthesias radiating down to her fingers.  I suspect that her paresthesias are likely related to carpal tunnel versus soft tissue inflammation pressing against median/ulnar nerves.  Unclear the etiology of the swelling, however does not appear to be an infectious process.  Patient does state that the swelling has improved as time is gone on.  X-rays do not show any acute abnormalities.  We will provide her with a referral to orthopedics for further evaluation management.  Patient was agreeable to this.  Will discharge.  Provided the patient with anticipatory guidance, return precautions, and educational material. Encouraged the patient to return to the emergency department at any time if they begin to experience any new or worsening symptoms. Patient expressed understanding and agreed with the plan.   Patient's presentation is most consistent with acute complicated illness / injury requiring diagnostic workup.       FINAL CLINICAL IMPRESSION(S) / ED DIAGNOSES   Final diagnoses:  Paresthesia and pain of right extremity  Arm swelling     Rx / DC Orders   ED Discharge Orders     None        Note:  This document was prepared using Dragon voice recognition software and may include unintentional dictation errors.   Teodoro Spray, [place] 05/04/22 2345    Nance Pear, MD 05/05/22 1501

## 2022-05-04 NOTE — ED Triage Notes (Signed)
Pt reports tingling in right hand starting end of last month. Pt reports being to ED and urgent care since and states she hasnt gotten any answers. Pt reports lump in [facility] area of right arm that has lessened in size and since end of last month and that starting tonight patient felt a lump in right upper arm that feels sore. No redness, firmness noted on assessment. Denies fevers, N/V. Pt endorses hx of carpal tunnel with intermittent tingling but that tonight tingling is constant in right hand, sensation intact. Radial pulse intact.

## 2022-12-24 ENCOUNTER — Other Ambulatory Visit: Payer: Self-pay

## 2023-05-09 ENCOUNTER — Emergency Department: Payer: BC Managed Care – PPO

## 2023-05-09 ENCOUNTER — Encounter: Payer: Self-pay | Admitting: *Deleted

## 2023-05-09 ENCOUNTER — Emergency Department
Admission: EM | Admit: 2023-05-09 | Discharge: 2023-05-10 | Disposition: A | Discharge status: Home / Self Care | Payer: BC Managed Care – PPO | Attending: Emergency Medicine | Admitting: Emergency Medicine

## 2023-05-09 ENCOUNTER — Other Ambulatory Visit: Payer: Self-pay

## 2023-05-09 DIAGNOSIS — R002 Palpitations: Secondary | ICD-10-CM | POA: Insufficient documentation

## 2023-05-09 DIAGNOSIS — R202 Paresthesia of skin: Secondary | ICD-10-CM | POA: Insufficient documentation

## 2023-05-09 DIAGNOSIS — R000 Tachycardia, unspecified: Secondary | ICD-10-CM | POA: Insufficient documentation

## 2023-05-09 DIAGNOSIS — Z8616 Personal history of COVID-19: Secondary | ICD-10-CM | POA: Insufficient documentation

## 2023-05-09 DIAGNOSIS — Z91040 Latex allergy status: Secondary | ICD-10-CM | POA: Diagnosis not present

## 2023-05-09 DIAGNOSIS — R200 Anesthesia of skin: Secondary | ICD-10-CM

## 2023-05-09 DIAGNOSIS — R079 Chest pain, unspecified: Secondary | ICD-10-CM | POA: Diagnosis not present

## 2023-05-09 LAB — BASIC METABOLIC PANEL
Anion gap: 14 (ref 5–15)
BUN: 9 mg/dL (ref 6–20)
CO2: 23 mmol/L (ref 22–32)
Calcium: 9.2 mg/dL (ref 8.9–10.3)
Chloride: 100 mmol/L (ref 98–111)
Creatinine, Ser: 0.81 mg/dL (ref 0.44–1.00)
GFR, Estimated: >60 mL/min (ref >60)
Glucose, Bld: 96 mg/dL (ref 70–99)
Potassium: 4.1 mmol/L (ref 3.5–5.1)
Sodium: 137 mmol/L (ref 135–145)

## 2023-05-09 LAB — CBC
HCT: 46.4 % — ABNORMAL HIGH (ref 36.0–46.0)
Hemoglobin: 14.8 g/dL (ref 12.0–15.0)
MCH: 28.5 pg (ref 26.0–34.0)
MCHC: 31.9 g/dL (ref 30.0–36.0)
MCV: 89.4 fL (ref 80.0–100.0)
Platelets: 290 10*3/uL (ref 150–400)
RBC: 5.19 MIL/uL — ABNORMAL HIGH (ref 3.87–5.11)
RDW: 12.8 % (ref 11.5–15.5)
WBC: 10 10*3/uL (ref 4.0–10.5)
nRBC: 0 % (ref 0.0–0.2)

## 2023-05-09 LAB — TROPONIN I (HIGH SENSITIVITY): Troponin I (High Sensitivity): <2 ng/L (ref <18)

## 2023-05-09 LAB — POC URINE PREG, ED: Preg Test, Ur: NEGATIVE

## 2023-05-09 LAB — MAGNESIUM: Magnesium: 2.2 mg/dL (ref 1.7–2.4)

## 2023-05-09 NOTE — ED Provider Notes (Signed)
[facility] Provider Note    Event Date/Time   First MD Initiated Contact with Patient 05/09/23 2310     (approximate)   History   Numbness and Blurred Vision   HPI  [name] is a 42 y.o. female who presents to the ED from home with a 2-week history of migratory numbness in her face and blurred vision as well as left-sided chest pain and numbness in her left arm.  She called her PCP to do some lab work and was referred to urgent care who referred her to the ED for evaluation.  Denies associated headache, neck pain, fever/chills, cough, shortness of breath, abdominal pain, nausea, vomiting or dizziness.  Cannot feel palpitations but states her watch has notified her of several episodes of tachycardia as high as the 130s.  Denies slurred speech, confusion, extremity weakness.  Denies recent travel, trauma or hormone use.     Past Medical History   Past Medical History:  Diagnosis Date   Anxiety    Bipolar 1 disorder (HCC)    COVID-19    08/2018, 08/02/21   Depression    Hyperlipidemia    Substance abuse (HCC)    etoh, heroin, cocaine, opiates   Tendonitis    L arm    UTI (urinary tract infection)      Active Problem List   Patient Active Problem List   Diagnosis Date Noted   Annual physical exam 10/28/2019   Overweight (BMI 25.0-29.9) 10/28/2019   Attention deficit disorder 08/29/2018   Left carpal tunnel syndrome 05/28/2018   Numbness and tingling in left arm 05/28/2018   Hyperlipidemia 05/28/2018   Cervical radiculopathy at C6 05/13/2018   Hepatitis C 03/04/2018   Substance abuse (HCC) 02/14/2018   Insomnia 02/14/2018   Bipolar disorder (HCC) 02/14/2018   Myositis 02/16/2014   Anxiety and depression 02/16/2014     Past Surgical History   Past Surgical History:  Procedure Laterality Date   LEEP     2001   TUBAL LIGATION       Home Medications   Prior to Admission medications   Medication Sig Start Date End Date  Taking? Authorizing Provider  albuterol (VENTOLIN HFA) 108 (90 Base) MCG/ACT inhaler Inhale 2 puffs into the lungs every 6 (six) hours as needed for wheezing or shortness of breath. 12/04/21   Margaretann Loveless, PA-C  benzonatate (TESSALON) 100 MG capsule Take 1 capsule (100 mg total) by mouth 3 (three) times daily as needed. 12/04/21   Margaretann Loveless, PA-C  buPROPion (WELLBUTRIN XL) 300 MG 24 hr tablet Take 1 tablet (300 mg total) by mouth daily. 10/29/18   McLean-Scocuzza, Pasty Spillers, MD  fluticasone (FLONASE) 50 MCG/ACT nasal spray Place 2 sprays into both nostrils daily. 08/02/21   Margaretann Loveless, PA-C  gabapentin (NEURONTIN) 400 MG capsule Take 1 capsule (400 mg total) by mouth 3 (three) times daily. 09/16/18   McLean-Scocuzza, Pasty Spillers, MD  ipratropium (ATROVENT) 0.03 % nasal spray Place 2 sprays into both nostrils every 12 (twelve) hours. 12/04/21   Margaretann Loveless, PA-C  lamoTRIgine (LAMICTAL) 100 MG tablet Take 1 tablet (100 mg total) by mouth daily. 10/29/18   McLean-Scocuzza, Pasty Spillers, MD  traZODone (DESYREL) 100 MG tablet Take 1 tablet (100 mg total) by mouth at bedtime as needed for sleep. 10/29/18   McLean-Scocuzza, Pasty Spillers, MD     Allergies  Chantix [varenicline tartrate] and Latex   Family History   Family History  Problem Relation Age of Onset   Cancer Mother        skin    Alcohol abuse Father    Depression Father    Thyroid disease Father    Stroke Father    Obesity Father    Cancer Maternal Grandmother        breast   Cancer Paternal Grandmother        breast   No family history of multiple sclerosis   Physical Exam  Triage Vital Signs: ED Triage Vitals  Encounter Vitals Group     BP 05/09/23 2039 (!) 120/94     Systolic BP Percentile --      Diastolic BP Percentile --      Pulse Rate 05/09/23 2039 95     Resp 05/09/23 2039 18     Temp 05/09/23 2039 98.5 F (36.9 C)     Temp Source 05/09/23 2039 Oral     SpO2 05/09/23 2039 100 %     Weight  05/09/23 2035 200 lb (90.7 kg)     Height 05/09/23 2035 5\' 7"  (1.702 m)     Head Circumference --      Peak Flow --      Pain Score 05/09/23 2035 0     Pain Loc --      Pain Education --      Exclude from Growth Chart --     Updated Vital Signs: BP (!) 120/94 (BP Location: Left Arm)   Pulse 95   Temp 98.5 F (36.9 C) (Oral)   Resp 18   Ht 5\' 7"  (1.702 m)   Wt 90.7 kg   LMP 04/25/2023 (Approximate)   SpO2 100%   BMI 31.32 kg/m    General: Awake, no distress.  CV:  RRR.  Good peripheral perfusion.  Resp:  Normal effort.  CTAB. Abd:  No distention.  Other:  No carotid bruits. Alert and oriented x 3.  CN II-XII grossly intact.  5/5 motor strength and sensation all extremities.  MAE x 4.   ED Results / Procedures / Treatments  Labs (all labs ordered are listed, but only abnormal results are displayed) Labs Reviewed  CBC - Abnormal; Notable for the following components:      Result Value   RBC 5.19 (*)    HCT 46.4 (*)    All other components within normal limits  BASIC METABOLIC PANEL  MAGNESIUM  TSH  T4, FREE  POC URINE PREG, ED  TROPONIN I (HIGH SENSITIVITY)  TROPONIN I (HIGH SENSITIVITY)     EKG  ED ECG REPORT I, Consuela Widener J, the attending physician, personally viewed and interpreted this ECG.   Date: 05/09/2023  EKG Time: 2049  Rate: 74  Rhythm: normal sinus rhythm  Axis: Normal  Intervals:none  ST&T Change: Nonspecific    RADIOLOGY I have independently visualized and interpreted patient's imaging studies as well as noted the radiology interpretation:  CT head: No ICH  Chest x-ray: No acute cardiopulmonary process  MRI brain: No acute abnormality  Official radiology report(s): MR BRAIN WO CONTRAST  Result Date: 05/10/2023 CLINICAL DATA:  Facial numbness and altered mental status EXAM: MRI HEAD WITHOUT CONTRAST TECHNIQUE: Multiplanar, multiecho pulse sequences of the brain and surrounding structures were obtained without intravenous contrast.  COMPARISON:  None Available. FINDINGS: Brain: No acute infarct, mass effect or extra-axial collection. No chronic microhemorrhage or siderosis. There is multifocal hyperintense T2-weighted signal within the white matter. Parenchymal volume and CSF spaces are normal. The midline  structures are normal. Vascular: Normal flow voids. Skull and upper cervical spine: Normal marrow signal. Sinuses/Orbits: Negative. Other: None. IMPRESSION: 1. No acute intracranial abnormality. 2. Multifocal hyperintense T2-weighted signal within the white matter, nonspecific but may be seen in the setting of migraine headaches or early chronic microvascular ischemia. Electronically Signed   By: Deatra Robinson M.D.   On: 05/10/2023 02:03   CT Head Wo Contrast  Result Date: 05/09/2023 CLINICAL DATA:  Numbness EXAM: CT HEAD WITHOUT CONTRAST TECHNIQUE: Contiguous axial images were obtained from the base of the skull through the vertex without intravenous contrast. RADIATION DOSE REDUCTION: This exam was performed according to the departmental dose-optimization program which includes automated exposure control, adjustment of the mA and/or kV according to patient size and/or use of iterative reconstruction technique. COMPARISON:  03/25/2013 FINDINGS: Brain: No acute intracranial abnormality. Specifically, no hemorrhage, hydrocephalus, mass lesion, acute infarction, or significant intracranial injury. Vascular: No hyperdense vessel or unexpected calcification. Skull: No acute calvarial abnormality. Sinuses/Orbits: No acute findings Other: None IMPRESSION: No acute intracranial abnormality. Electronically Signed   By: Charlett Nose M.D.   On: 05/09/2023 21:10   DG Chest 2 View  Result Date: 05/09/2023 CLINICAL DATA:  Left-sided chest pain and facial numbness. EXAM: CHEST - 2 VIEW COMPARISON:  October 09, 2018 FINDINGS: The heart size and mediastinal contours are within normal limits. There is no evidence of an acute infiltrate, pleural effusion  or pneumothorax. The visualized skeletal structures are unremarkable. IMPRESSION: No active cardiopulmonary disease. Electronically Signed   By: Aram Candela M.D.   On: 05/09/2023 21:09     PROCEDURES:  Critical Care performed: No  .1-3 Lead EKG Interpretation  Performed by: Irean Hong, MD Authorized by: Irean Hong, MD     Interpretation: normal     ECG rate:  95   ECG rate assessment: normal     Rhythm: sinus rhythm     Ectopy: none     Conduction: normal   Comments:     Patient placed on cardiac monitor to evaluate for arrhythmias    MEDICATIONS ORDERED IN ED: Medications - No data to display   IMPRESSION / MDM / ASSESSMENT AND PLAN / ED COURSE  I reviewed the triage vital signs and the nursing notes.                             42 year old female presenting with numbness, palpitations, chest pain. Differential diagnosis includes, but is not limited to, throat, TIA, MS, ACS, aortic dissection, pulmonary embolism, cardiac tamponade, pneumothorax, pneumonia, pericarditis, myocarditis, GI-related causes including esophagitis/gastritis, and musculoskeletal chest wall pain.   I personally reviewed patient's records and note a PCP office visit on 09/28/2022 for alcohol use disorder.  Patient's presentation is most consistent with acute presentation with potential threat to life or bodily function.  The patient is on the cardiac monitor to evaluate for evidence of arrhythmia and/or significant heart rate changes.  Laboratory results including magnesium and initial troponin, CT head, chest x-ray unremarkable.  Pending repeat troponin.  Will obtain MRI of the brain.  Will reassess.  Clinical Course as of 05/10/23 1610  Caleen Essex May 10, 2023  0208 Repeat troponin and thyroid panel unremarkable.  MRI negative for acute abnormalities, microvascular ischemia noted.  I have personally reviewed patient's last several clinic visits and note normal BP. She prefers neurology referral  through her PCP. Strict return precautions given. Patient verbalizes understanding and agrees with  plan of care. [JS]    Clinical Course User Index [JS] Irean Hong, MD     FINAL CLINICAL IMPRESSION(S) / ED DIAGNOSES   Final diagnoses:  Paresthesias  Numbness  Chest pain, unspecified type  Palpitations     Rx / DC Orders   ED Discharge Orders     None        Note:  This document was prepared using Dragon voice recognition software and may include unintentional dictation errors.   Irean Hong, MD 05/10/23 [phone]

## 2023-05-09 NOTE — ED Triage Notes (Signed)
Pt reports numbness in face for 2 weeks.  Pt reports blurred vision for 2 weeks.  Pt also reports left side chest pain and numbness in left arm today.  No sob.  No slurred speech or headache.  Pt alert  speech clear.

## 2023-05-09 NOTE — ED Notes (Signed)
Pt to MRI

## 2023-05-10 LAB — TSH: TSH: 4.455 u[IU]/mL (ref 0.350–4.500)

## 2023-05-10 LAB — T4, FREE: Free T4: 0.8 ng/dL (ref 0.61–1.12)

## 2023-05-10 LAB — TROPONIN I (HIGH SENSITIVITY): Troponin I (High Sensitivity): <2 ng/L (ref <18)

## 2023-05-10 NOTE — Discharge Instructions (Signed)
Continue to take your medicines daily as directed by your doctor.  Return to the ER for worsening symptoms, persistent vomiting, difficulty breathing or other concerns.

## 2023-08-02 ENCOUNTER — Telehealth: Payer: BC Managed Care – PPO | Admitting: Nurse Practitioner

## 2023-08-02 DIAGNOSIS — M545 Low back pain, unspecified: Secondary | ICD-10-CM

## 2023-08-02 MED ORDER — PREDNISONE 10 MG (21) PO TBPK
ORAL_TABLET | ORAL | 0 refills | Status: AC
Start: 2023-08-02 — End: ?

## 2023-08-02 NOTE — Progress Notes (Signed)
E-Visit for Back Pain   We are sorry that you are not feeling well.  Here is how we plan to help!  Based on what you have shared with me it looks like you mostly have acute back pain.  Acute back pain is defined as musculoskeletal pain that can resolve in 1-3 weeks with conservative treatment.  I have prescribed   Meds ordered this encounter  Medications   predniSONE (STERAPRED UNI-PAK 21 TAB) 10 MG (21) TBPK tablet    Sig: Take 6 tablets on day one, 5 on day two, 4 on day three, 3 on day four, 2 on day five, and 1 on day six. Take with food.    Dispense:  21 tablet    Refill:  0   Some patients experience stomach irritation or in increased heartburn with anti-inflammatory drugs.    Back pain is very common.  The pain often gets better over time.  The cause of back pain is usually not dangerous.  Most people can learn to manage their back pain on their own.  Home Care Stay active.  Start with short walks on flat ground if you can.  Try to walk farther each day. Do not sit, drive or stand in one place for more than 30 minutes.  Do not stay in bed. Do not avoid exercise or work.  Activity can help your back heal faster. Be careful when you bend or lift an object.  Bend at your knees, keep the object close to you, and do not twist. Sleep on a firm mattress.  Lie on your side, and bend your knees.  If you lie on your back, put a pillow under your knees. Only take medicines as told by your doctor. Put ice on the injured area. Put ice in a plastic bag Place a towel between your skin and the bag Leave the ice on for 15-20 minutes, 3-4 times a day for the first 2-3 days. 210 After that, you can switch between ice and heat packs. Ask your doctor about back exercises or massage. Avoid feeling anxious or stressed.  Find good ways to deal with stress, such as exercise.  Get Help Right Way If: Your pain does not go away with rest or medicine. Your pain does not go away in 1 week. You have new  problems. You do not feel well. The pain spreads into your legs. You cannot control when you poop (bowel movement) or pee (urinate) You feel sick to your stomach (nauseous) or throw up (vomit) You have belly (abdominal) pain. You feel like you may pass out (faint). If you develop a fever.  Make Sure you: Understand these instructions. Will watch your condition Will get help right away if you are not doing well or get worse.  Your e-visit answers were reviewed by a board certified advanced clinical practitioner to complete your personal care plan.  Depending on the condition, your plan could have included both over the counter or prescription medications.  If there is a problem please reply  once you have received a response from your provider.  Your safety is important to [place].  If you have drug allergies check your prescription carefully.    You can use MyChart to ask questions about today's visit, request a non-urgent call back, or ask for a work or school excuse for 24 hours related to this e-Visit. If it has been greater than 24 hours you will need to follow up with your provider, or enter  a new e-Visit to address those concerns.  You will get an e-mail in the next two days asking about your experience.  I hope that your e-visit has been valuable and will speed your recovery. Thank you for using e-visits.   Meds ordered this encounter  Medications   predniSONE (STERAPRED UNI-PAK 21 TAB) 10 MG (21) TBPK tablet    Sig: Take 6 tablets on day one, 5 on day two, 4 on day three, 3 on day four, 2 on day five, and 1 on day six. Take with food.    Dispense:  21 tablet    Refill:  0     I spent approximately 5 minutes reviewing the patient's history, current symptoms and coordinating their care today.

## 2023-11-21 ENCOUNTER — Telehealth: Payer: Self-pay

## 2023-11-21 NOTE — Telephone Encounter (Signed)
 That's fine. Next available new patient slot.

## 2023-11-21 NOTE — Telephone Encounter (Signed)
 Copied from CRM [id number]. Topic: Appointments - Transfer of Care >> Nov 21, 2023  9:35 AM Tiffany S wrote: Pt is requesting to transfer FROM: Dr Aimee Hite  Pt is requesting to transfer TO: NP Helayne Lo  Reason for requested transfer: Need something closer  It is the responsibility of the team the patient would like to transfer to (Dr. NP Helayne Lo ) to reach out to the patient if for any reason this transfer is not acceptable.

## 2024-03-16 ENCOUNTER — Encounter

## 2024-03-16 DIAGNOSIS — Z1231 Encounter for screening mammogram for malignant neoplasm of breast: Secondary | ICD-10-CM

## 2024-06-11 ENCOUNTER — Ambulatory Visit: Admitting: Internal Medicine
